# Patient Record
Sex: Male | Born: 1980
Health system: Southern US, Community
[De-identification: ages and names within clinical notes are randomized; demographics above are authoritative.]

## PROBLEM LIST (undated history)

## (undated) DIAGNOSIS — E119 Type 2 diabetes mellitus without complications: Secondary | ICD-10-CM

## (undated) DIAGNOSIS — I10 Essential (primary) hypertension: Secondary | ICD-10-CM

## (undated) HISTORY — DX: Essential (primary) hypertension: I10

## (undated) HISTORY — DX: Type 2 diabetes mellitus without complications: E11.9

---

## 2011-12-22 ENCOUNTER — Ambulatory Visit: Payer: Self-pay

## 2011-12-22 ENCOUNTER — Encounter: Payer: Self-pay | Admitting: Family Medicine

## 2011-12-22 ENCOUNTER — Ambulatory Visit (INDEPENDENT_AMBULATORY_CARE_PROVIDER_SITE_OTHER): Payer: Self-pay | Admitting: Family Medicine

## 2011-12-22 VITALS — BP 144/98 | HR 88 | Temp 98.2°F | Resp 16 | Ht 65.5 in | Wt 168.6 lb

## 2011-12-22 DIAGNOSIS — S6991XA Unspecified injury of right wrist, hand and finger(s), initial encounter: Secondary | ICD-10-CM

## 2011-12-22 DIAGNOSIS — S61409A Unspecified open wound of unspecified hand, initial encounter: Secondary | ICD-10-CM

## 2011-12-22 DIAGNOSIS — S61411A Laceration without foreign body of right hand, initial encounter: Secondary | ICD-10-CM

## 2011-12-22 DIAGNOSIS — S61459A Open bite of unspecified hand, initial encounter: Secondary | ICD-10-CM

## 2011-12-22 DIAGNOSIS — S6990XA Unspecified injury of unspecified wrist, hand and finger(s), initial encounter: Secondary | ICD-10-CM

## 2011-12-22 MED ORDER — AMOXICILLIN-POT CLAVULANATE 875-125 MG PO TABS: 1.0000 | ORAL_TABLET | Freq: Two times a day (BID) | ORAL | Status: DC

## 2011-12-22 MED ORDER — HYDROCODONE-ACETAMINOPHEN 5-500 MG PO TABS: ORAL_TABLET | ORAL | Status: DC

## 2011-12-22 MED ORDER — CEFTRIAXONE SODIUM 1 G IJ SOLR
1.0000 g | Freq: Once | INTRAMUSCULAR | Status: AC
  Administered 2011-12-22: 1 g via INTRAMUSCULAR

## 2011-12-22 NOTE — Progress Notes (Signed)
[address]   [place], [place]  [address]   [phone]  Subjective:    Patient ID: Billy Gregory, male    DOB: January 26, 1981, 31 y.o.   MRN: [id number]  HPI This 31 y.o. male presents for evaluation of dog bite to R hand.  Suffered dog bite to R hand this morning 1 hour before visit.  Was playing with 76 month old puppy rockweiler at home; fed puppy and dog bit patient on R hand.  Lots of bleeding from bite. Dog behaving normally; no unusual behavior.  Dog is 33 month old puppy who patient got from his friend; does not have vaccination record; pt's friend lost vaccination record but advised pt that vaccinations all UTD.    Last Tetanus this year four months ago.  Denies numbness/tinglilng/weakness in hand or fingers.  R hand dominant.  Friend present to translate. Dr. Marice Potter also collected history from patient and provided discharge instructions.    PMH: none Psurg:  None All: NKDA Medications: none Social: works in Stage manager; from [place].    Review of Systems  Constitutional: Negative for fever, chills, diaphoresis and fatigue.  Musculoskeletal: Positive for myalgias and arthralgias. Negative for joint swelling.  Skin: Positive for wound. Negative for color change, pallor and rash.       Objective:   Physical Exam  Nursing note and vitals reviewed. Constitutional: He is oriented to person, place, and time. He appears well-developed and well-nourished. No distress.  HENT:  Head: Normocephalic and atraumatic.  Eyes: Conjunctivae normal are normal. Pupils are equal, round, and reactive to light.  Cardiovascular: Intact distal pulses.        CAPILLARY REFILL < 3 SECONDS.  Musculoskeletal: Normal range of motion.       Right hand: He exhibits tenderness and laceration. He exhibits normal range of motion, no bony tenderness and no swelling. normal sensation noted. Normal strength noted.       R HAND:  FULL EXTENSION AND FLEXION 3RD DIGIT R HAND AT DIP, PIP JOINTS;  GRIP 5/5.  NORMAL FLEXION, EXTENSION 3RD DIGIT.    Neurological: He is alert and oriented to person, place, and time.       SENSATION INTACT DISTAL 3RD DIGIT.  Skin: He is not diaphoretic.       8 MM X 5 MM PROXIMAL 3RD DIGIT VOLAR SURFACE WITHOUT TENDON INVOLVEMENT.   X LACERATION DORSAL ASPECT OF DISTAL 3RD DIGIT WITH VOLAR PLATE INVOLVEMENT/LACERATION/AVULSION.  Psychiatric: He has a normal mood and affect. His behavior is normal. Judgment and thought content normal.     PROCEDURE NOTE: SEE PROCEDURE NOTE ATTACHED TO NOTE. ROCEPHIN 1 GRAM IM ADMINISTERED DURING VISIT. [facility] reading (PRIMARY) by  Dr. Katrinka Blazing.  R hand xray: negative.    Assessment & Plan:   1. Injury of right hand  DG Hand Complete Right, cefTRIAXone (ROCEPHIN) injection 1 g  2. Laceration of right hand, complicated       1.  Pain R hand: New.  Secondary to dog bite with laceration.  Rx for Vicodin 5/500 provided.   2.  Laceration R hand complicated with volar plate involvement. Tetatus UTD per patient in 2013.  Refer for hand surgery tomorrow/[facility].  Wound loosely closed until evaluation by hand surgery in 48 hours. 3.  Dog bite from personal dog:  New.  S/p Rocephin 1 gram IM; rx Augmentin 875mg  bid x 10 days.  Report generated during visit and faxed to animal control of [facility]  Dog  behaving normally per patient; to attempt to obtain dog immunization records from vet.

## 2011-12-22 NOTE — Patient Instructions (Addendum)
1. Injury of right hand  DG Hand Complete Right, cefTRIAXone (ROCEPHIN) injection 1 g, HYDROcodone-acetaminophen (VICODIN) 5-500 MG per tablet  2. Laceration of right hand, complicated    3. Dog bite of hand  amoxicillin-clavulanate (AUGMENTIN) 875-125 MG per tablet     PLEASE CALL DR. Ronie Spies OFFICE (HAND SURGERY) TOMORROW FOR APPOINTMENT ON Tuesday, December 24, 2011.   DR. Ronie Spies OFFICE NUMBER    [phone] [address]    [place], [place]

## 2011-12-22 NOTE — Progress Notes (Signed)
   Patient ID: Billy Gregory MRN: [id number], DOB: Apr 21, 1980, 31 y.o. Date of Encounter: 12/22/2011, 2:27 PM   PROCEDURE NOTE: Verbal consent obtained. Sterile technique employed. Numbing: Anesthesia obtained with 2% plain lidocaine 3 cc for local anesthesia on volar surface and 2 cc for dorsal surface.   Cleansed with soap and water. Irrigated with normal saline. Wounds explored. No deep structures involved in volar wound. Lateral aspect of tendon sheath avulsed along dorsal wound. Wounds washed again with soap and water and irrigated. Betadine prep per usual protocol.  Volar wound loosely approximated with #4 simple interrupted sutures 5-0 Prolene. Dorsal wound loosely approximated with #1 simple interrupted suture 5-0 Prolene. Hemostasis obtained. Wound cleansed and dressed.  Wound care instructions including precautions covered with patient. Handout given.  Bite report filled out and faxed to animal control.   Signed, Eula Listen, PA-C 12/22/2011 2:27 PM

## 2012-01-22 NOTE — Progress Notes (Signed)
Reviewed and agree.

## 2013-10-06 IMAGING — CR DG HAND COMPLETE 3+V*R*
3 series · 3 of 3 positions shown · non-contrast
Comparison: None.

CLINICAL DATA: Dog bite.  Pain.

RIGHT HAND - COMPLETE 3+ VIEW

[PA]
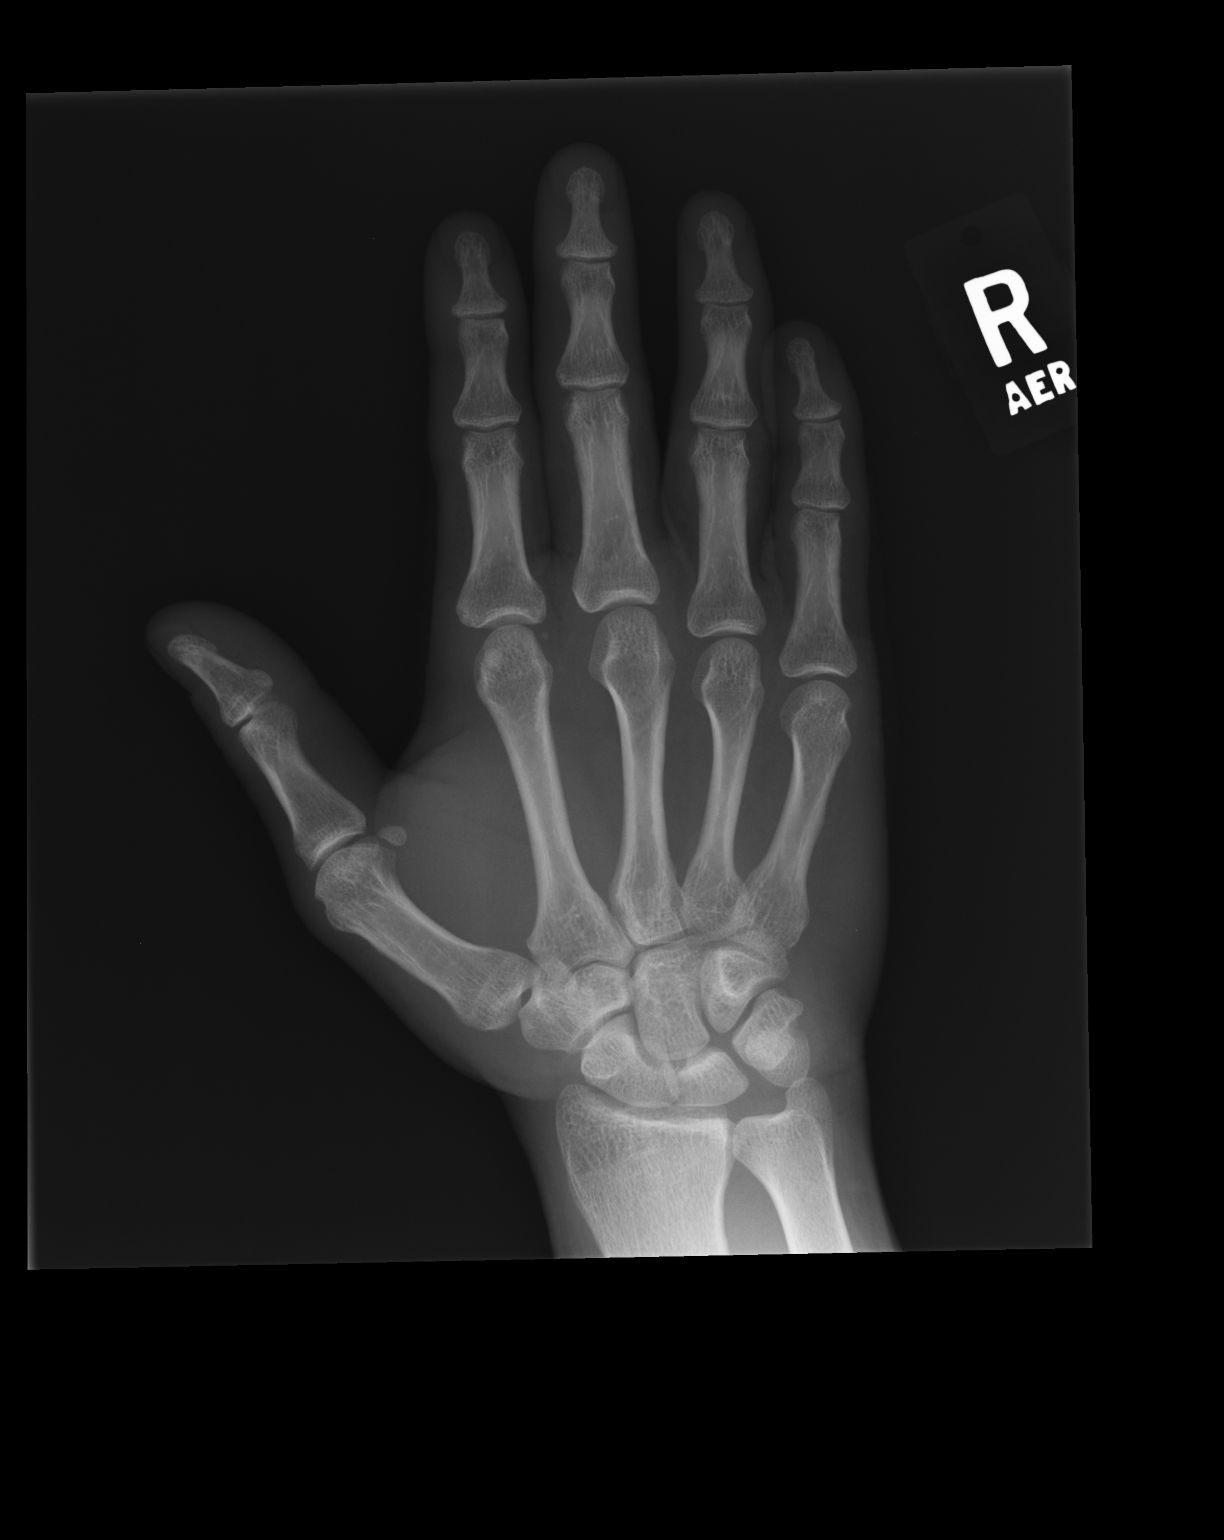

[lateral]
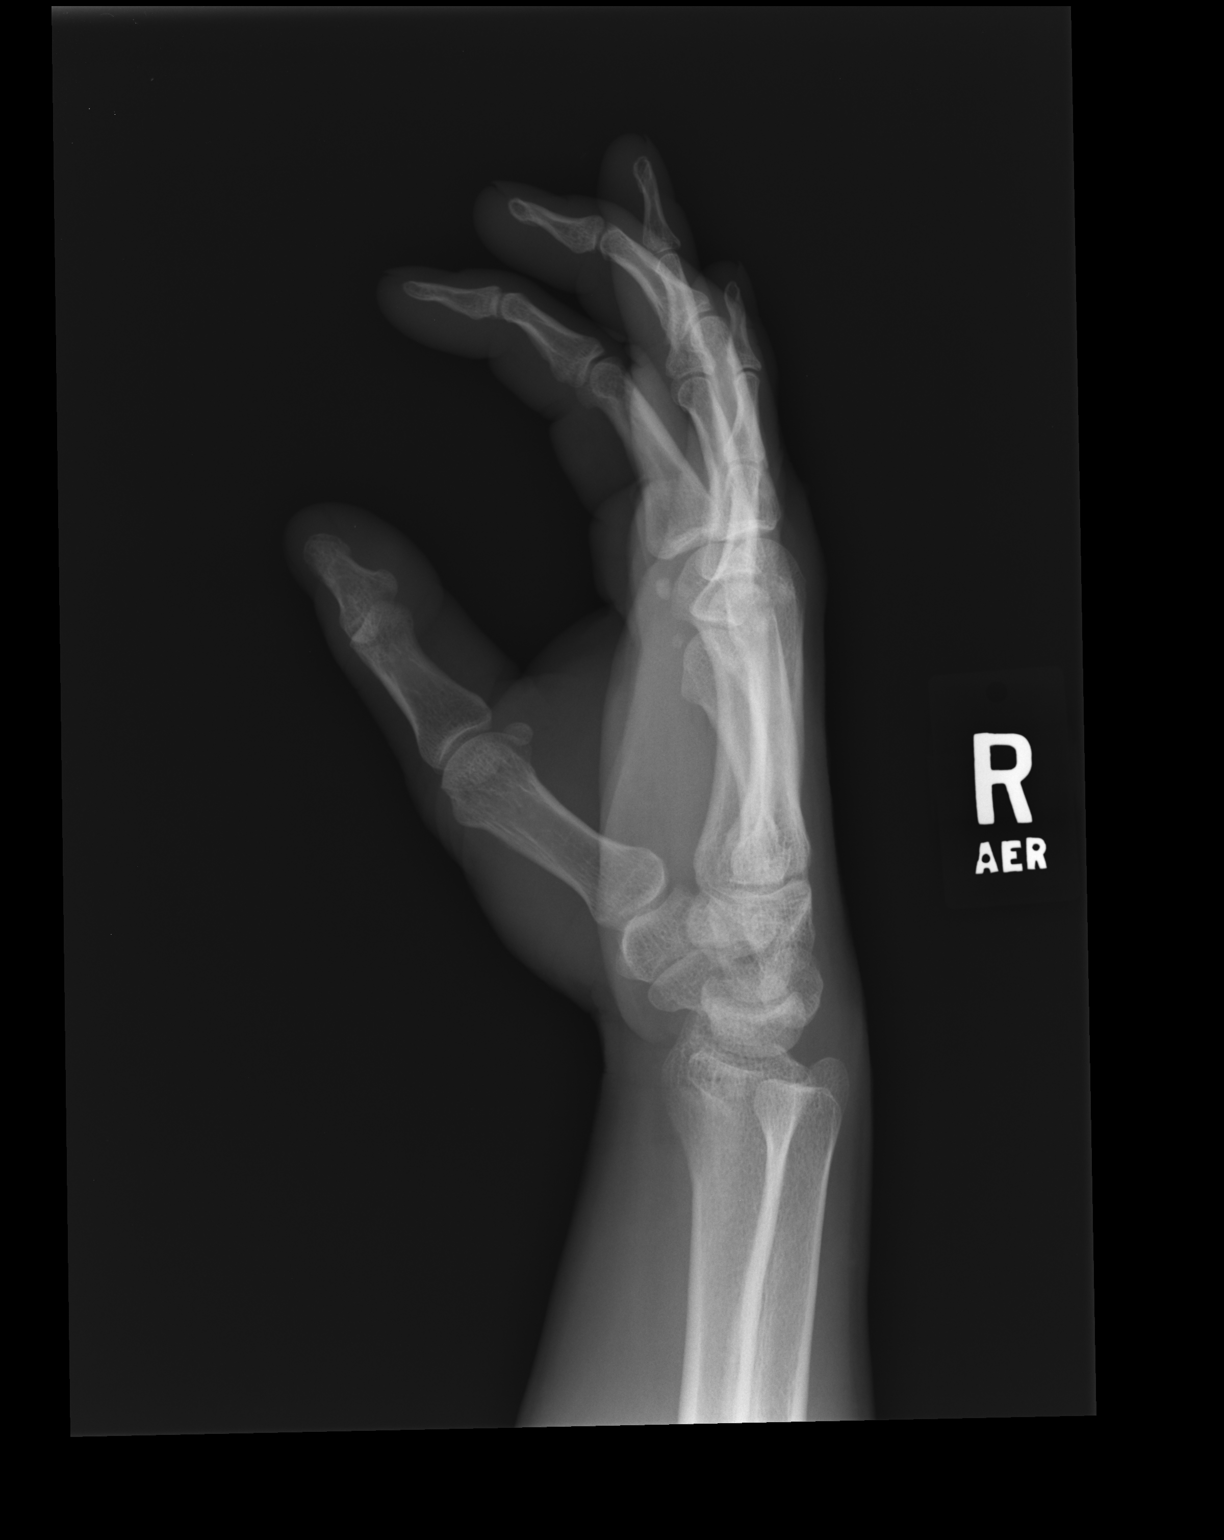

[pa obl]
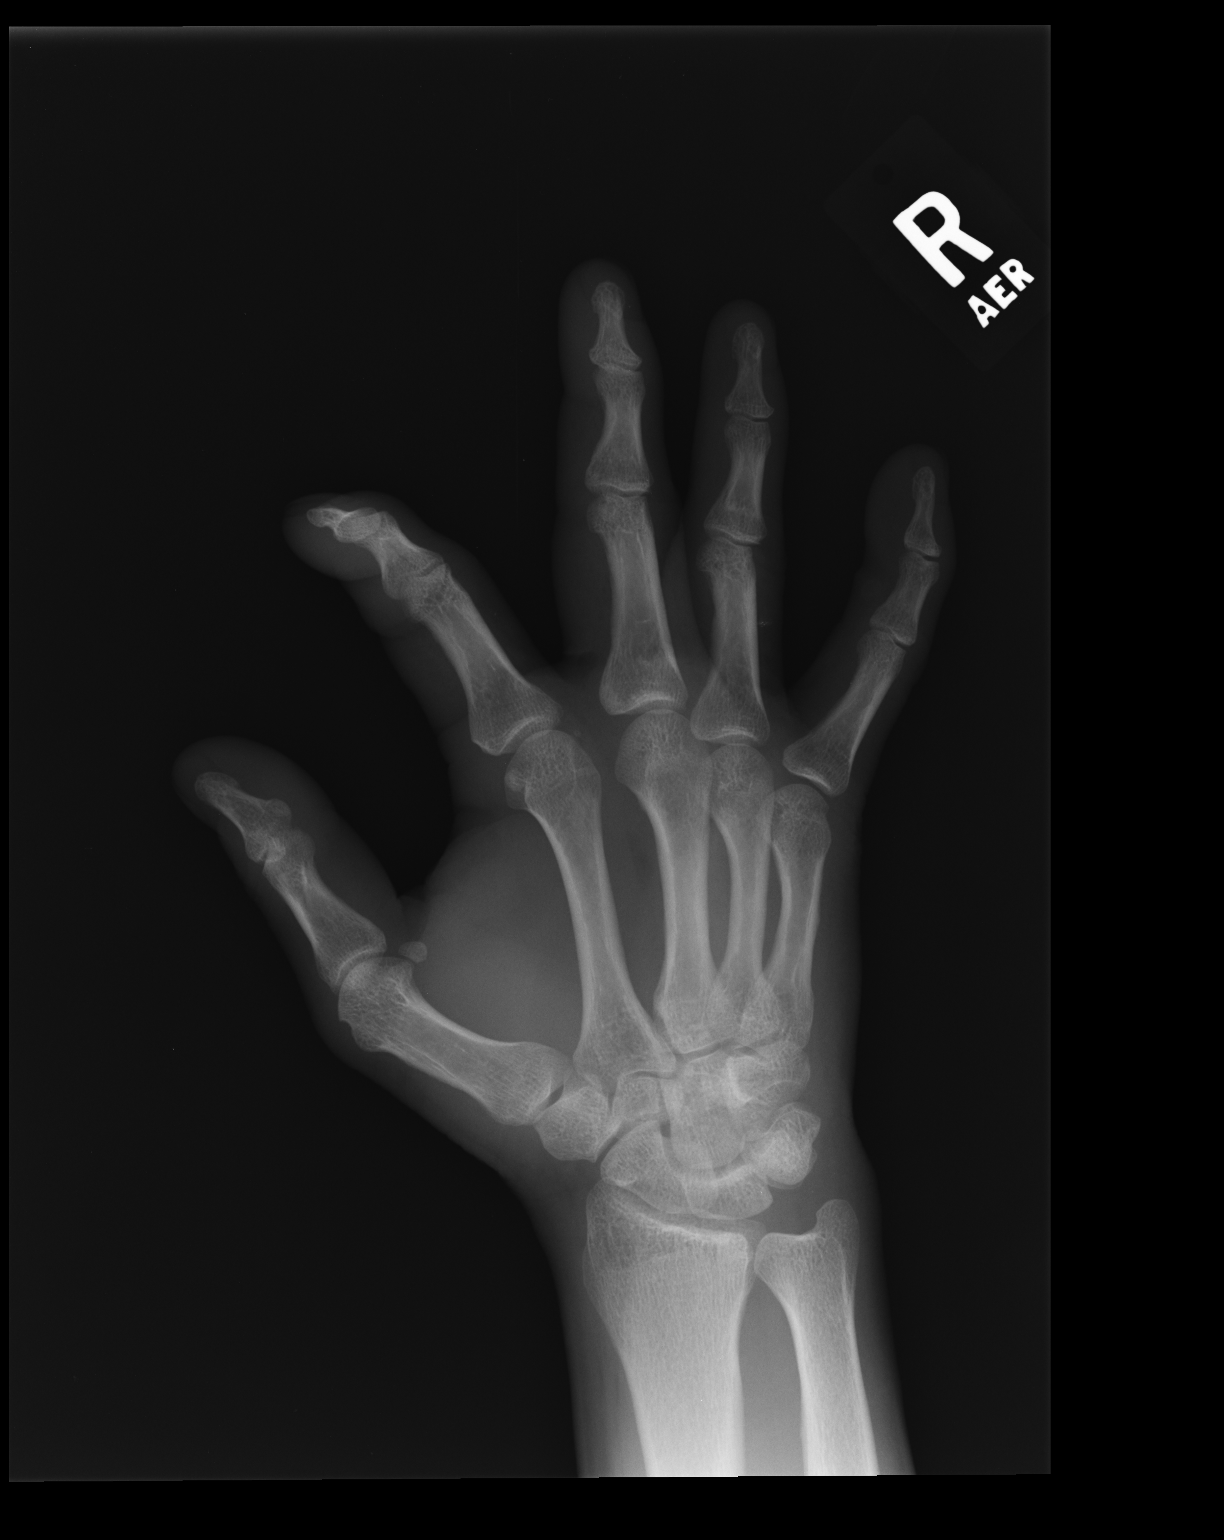

[3 of 3 positions shown; findings below may reference images not displayed]

FINDINGS: Imaged bones, joints and soft tissues appear normal.
IMPRESSION: Negative exam.

## 2014-04-03 ENCOUNTER — Encounter (HOSPITAL_COMMUNITY): Payer: Self-pay | Admitting: Emergency Medicine

## 2014-04-03 ENCOUNTER — Inpatient Hospital Stay (HOSPITAL_COMMUNITY)
Admission: EM | Admit: 2014-04-03 | Discharge: 2014-04-05 | DRG: 639 | Disposition: A | Discharge status: Home / Self Care | Payer: Medicaid Other | Attending: Family Medicine | Admitting: Family Medicine

## 2014-04-03 ENCOUNTER — Emergency Department (INDEPENDENT_AMBULATORY_CARE_PROVIDER_SITE_OTHER)
Admission: EM | Admit: 2014-04-03 | Discharge: 2014-04-03 | Disposition: A | Discharge status: Nursing Home (Includes ICF) | Payer: Medicaid Other | Source: Home / Self Care

## 2014-04-03 ENCOUNTER — Encounter (HOSPITAL_COMMUNITY): Payer: Self-pay | Admitting: *Deleted

## 2014-04-03 DIAGNOSIS — E1165 Type 2 diabetes mellitus with hyperglycemia: Secondary | ICD-10-CM | POA: Diagnosis not present

## 2014-04-03 DIAGNOSIS — R739 Hyperglycemia, unspecified: Secondary | ICD-10-CM | POA: Diagnosis not present

## 2014-04-03 DIAGNOSIS — H538 Other visual disturbances: Secondary | ICD-10-CM | POA: Diagnosis present

## 2014-04-03 DIAGNOSIS — F17210 Nicotine dependence, cigarettes, uncomplicated: Secondary | ICD-10-CM | POA: Diagnosis present

## 2014-04-03 DIAGNOSIS — E119 Type 2 diabetes mellitus without complications: Secondary | ICD-10-CM | POA: Insufficient documentation

## 2014-04-03 DIAGNOSIS — E860 Dehydration: Secondary | ICD-10-CM

## 2014-04-03 LAB — POCT I-STAT, CHEM 8
BUN: 22 mg/dL (ref 6–23)
CHLORIDE: 96 meq/L (ref 96–112)
CREATININE: 1 mg/dL (ref 0.50–1.35)
Calcium, Ion: 1.28 mmol/L — ABNORMAL HIGH (ref 1.12–1.23)
Glucose, Bld: >700 mg/dL (ref 70–99)
HEMATOCRIT: 50 % (ref 39.0–52.0)
Hemoglobin: 17 g/dL (ref 13.0–17.0)
POTASSIUM: 4.8 mmol/L (ref 3.5–5.1)
Sodium: 134 mmol/L — ABNORMAL LOW (ref 135–145)
TCO2: 24 mmol/L (ref 0–100)

## 2014-04-03 LAB — POCT URINALYSIS DIP (DEVICE)
BILIRUBIN URINE: NEGATIVE
HGB URINE DIPSTICK: NEGATIVE
KETONES UR: 15 mg/dL — AB
LEUKOCYTES UA: NEGATIVE
Nitrite: NEGATIVE
PH: 5 (ref 5.0–8.0)
Protein, ur: NEGATIVE mg/dL
Specific Gravity, Urine: <=1.005 (ref 1.005–1.030)
UROBILINOGEN UA: 0.2 mg/dL (ref 0.0–1.0)

## 2014-04-03 LAB — COMPREHENSIVE METABOLIC PANEL
ALT: 59 U/L — ABNORMAL HIGH (ref 0–53)
AST: 30 U/L (ref 0–37)
Albumin: 3.8 g/dL (ref 3.5–5.2)
Alkaline Phosphatase: 91 U/L (ref 39–117)
Anion gap: 7 (ref 5–15)
BUN: 17 mg/dL (ref 6–23)
CO2: 25 mmol/L (ref 19–32)
Calcium: 9.4 mg/dL (ref 8.4–10.5)
Chloride: 106 mEq/L (ref 96–112)
Creatinine, Ser: 1 mg/dL (ref 0.50–1.35)
GFR calc non Af Amer: >90 mL/min (ref >90)
GLUCOSE: 546 mg/dL — AB (ref 70–99)
Potassium: 4.4 mmol/L (ref 3.5–5.1)
SODIUM: 138 mmol/L (ref 135–145)
Total Bilirubin: 2.1 mg/dL — ABNORMAL HIGH (ref 0.3–1.2)
Total Protein: 6.7 g/dL (ref 6.0–8.3)

## 2014-04-03 LAB — BASIC METABOLIC PANEL
Anion gap: 10 (ref 5–15)
BUN: 11 mg/dL (ref 6–23)
CO2: 21 mmol/L (ref 19–32)
CREATININE: 0.73 mg/dL (ref 0.50–1.35)
Calcium: 8.6 mg/dL (ref 8.4–10.5)
Chloride: 112 mEq/L (ref 96–112)
GFR calc non Af Amer: >90 mL/min (ref >90)
Glucose, Bld: 301 mg/dL — ABNORMAL HIGH (ref 70–99)
Potassium: 3.7 mmol/L (ref 3.5–5.1)
Sodium: 143 mmol/L (ref 135–145)

## 2014-04-03 LAB — URINALYSIS, ROUTINE W REFLEX MICROSCOPIC
BILIRUBIN URINE: NEGATIVE
Glucose, UA: >1000 mg/dL — AB
Ketones, ur: 40 mg/dL — AB
LEUKOCYTES UA: NEGATIVE
Nitrite: NEGATIVE
PROTEIN: NEGATIVE mg/dL
Specific Gravity, Urine: 1.038 — ABNORMAL HIGH (ref 1.005–1.030)
Urobilinogen, UA: 0.2 mg/dL (ref 0.0–1.0)
pH: 5 (ref 5.0–8.0)

## 2014-04-03 LAB — URINE MICROSCOPIC-ADD ON

## 2014-04-03 LAB — CBC
HCT: 39.4 % (ref 39.0–52.0)
Hemoglobin: 14.2 g/dL (ref 13.0–17.0)
MCH: 28 pg (ref 26.0–34.0)
MCHC: 36 g/dL (ref 30.0–36.0)
MCV: 77.6 fL — ABNORMAL LOW (ref 78.0–100.0)
PLATELETS: 181 10*3/uL (ref 150–400)
RBC: 5.08 MIL/uL (ref 4.22–5.81)
RDW: 12.4 % (ref 11.5–15.5)
WBC: 8.8 10*3/uL (ref 4.0–10.5)

## 2014-04-03 LAB — GLUCOSE, CAPILLARY: Glucose-Capillary: 453 mg/dL — ABNORMAL HIGH (ref 70–99)

## 2014-04-03 LAB — CBG MONITORING, ED: Glucose-Capillary: 337 mg/dL — ABNORMAL HIGH (ref 70–99)

## 2014-04-03 MED ORDER — SODIUM CHLORIDE 0.9 % IV BOLUS (SEPSIS)
1000.0000 mL | Freq: Once | INTRAVENOUS | Status: AC
  Administered 2014-04-03: 1000 mL via INTRAVENOUS

## 2014-04-03 MED ORDER — SODIUM CHLORIDE 0.9 % IV SOLN
INTRAVENOUS | Status: DC
  Administered 2014-04-03: 17:00:00 via INTRAVENOUS

## 2014-04-03 MED ORDER — SODIUM CHLORIDE 0.9 % IV SOLN
Freq: Once | INTRAVENOUS | Status: AC
  Administered 2014-04-03: 12:00:00 via INTRAVENOUS

## 2014-04-03 MED ORDER — INFLUENZA VAC SPLIT QUAD 0.5 ML IM SUSY: 0.5000 mL | PREFILLED_SYRINGE | INTRAMUSCULAR | Status: DC | PRN

## 2014-04-03 MED ORDER — INSULIN ASPART 100 UNIT/ML ~~LOC~~ SOLN
10.0000 [IU] | Freq: Once | SUBCUTANEOUS | Status: AC
  Administered 2014-04-03: 10 [IU] via SUBCUTANEOUS

## 2014-04-03 MED ORDER — ACETAMINOPHEN 325 MG PO TABS
650.0000 mg | ORAL_TABLET | Freq: Four times a day (QID) | ORAL | Status: DC | PRN
Start: 2014-04-03 — End: 2014-04-05

## 2014-04-03 MED ORDER — INSULIN ASPART 100 UNIT/ML ~~LOC~~ SOLN
0.0000 [IU] | Freq: Three times a day (TID) | SUBCUTANEOUS | Status: DC
  Administered 2014-04-04 (×2): 8 [IU] via SUBCUTANEOUS
  Administered 2014-04-04: 5 [IU] via SUBCUTANEOUS
  Administered 2014-04-05: 15 [IU] via SUBCUTANEOUS
  Administered 2014-04-05: 5 [IU] via SUBCUTANEOUS

## 2014-04-03 MED ORDER — SODIUM CHLORIDE 0.9 % IV SOLN
INTRAVENOUS | Status: DC
  Administered 2014-04-03: 23:00:00 via INTRAVENOUS
  Administered 2014-04-03: 150 mL/h via INTRAVENOUS
  Administered 2014-04-04: 05:00:00 via INTRAVENOUS

## 2014-04-03 MED ORDER — INSULIN ASPART 100 UNIT/ML ~~LOC~~ SOLN
0.0000 [IU] | Freq: Every day | SUBCUTANEOUS | Status: DC
  Administered 2014-04-04: 2 [IU] via SUBCUTANEOUS

## 2014-04-03 MED ORDER — INSULIN ASPART 100 UNIT/ML ~~LOC~~ SOLN
14.0000 [IU] | Freq: Once | SUBCUTANEOUS | Status: AC
  Administered 2014-04-03: 14 [IU] via SUBCUTANEOUS
  Filled 2014-04-03: qty 1

## 2014-04-03 MED ORDER — ACETAMINOPHEN 650 MG RE SUPP: 650.0000 mg | Freq: Four times a day (QID) | RECTAL | Status: DC | PRN

## 2014-04-03 MED ORDER — INSULIN ASPART 100 UNIT/ML ~~LOC~~ SOLN: 0.0000 [IU] | Freq: Three times a day (TID) | SUBCUTANEOUS | Status: DC

## 2014-04-03 MED ORDER — INSULIN ASPART 100 UNIT/ML ~~LOC~~ SOLN: 0.0000 [IU] | Freq: Every day | SUBCUTANEOUS | Status: DC

## 2014-04-03 MED ORDER — SODIUM CHLORIDE 0.9 % IV SOLN: Freq: Once | INTRAVENOUS | Status: DC

## 2014-04-03 MED ORDER — ENOXAPARIN SODIUM 40 MG/0.4ML ~~LOC~~ SOLN
40.0000 mg | SUBCUTANEOUS | Status: DC
  Administered 2014-04-03 – 2014-04-04 (×2): 40 mg via SUBCUTANEOUS
  Filled 2014-04-03 (×3): qty 0.4

## 2014-04-03 MED ORDER — PNEUMOCOCCAL VAC POLYVALENT 25 MCG/0.5ML IJ INJ: 0.5000 mL | INJECTION | INTRAMUSCULAR | Status: DC | PRN

## 2014-04-03 MED ORDER — INSULIN GLARGINE 100 UNIT/ML ~~LOC~~ SOLN
10.0000 [IU] | Freq: Every day | SUBCUTANEOUS | Status: DC
  Administered 2014-04-04 – 2014-04-05 (×2): 10 [IU] via SUBCUTANEOUS
  Filled 2014-04-03 (×2): qty 0.1

## 2014-04-03 NOTE — ED Notes (Signed)
[facility] notified of pt transport.  Report called to ED Charge RN, Lenna Sciara.

## 2014-04-03 NOTE — ED Notes (Signed)
From [facility]; went to [facility] for thirst x 46month, blurry vision and shakiness x days. At [facility] CBG read greater than 700. Given 1L NS and sent to ED. Denies pain.

## 2014-04-03 NOTE — ED Notes (Signed)
Per wife: c/o thirst x 1 month.  Over past 2 days has had episodes of "shaking", c/o dizziness & blurred vision.  Denies hx diabetes.

## 2014-04-03 NOTE — H&P (Signed)
[facility] Admission History and Physical Service Pager: [phone]  Patient name: Billy Gregory Medical record number: [id number] Date of birth: 08/15/1980 Age: 34 y.o. Gender: male  Primary Care Provider: No PCP Per Patient Consultants: None  Code Status: Full  Chief Complaint: Polyuria and polydipsia  Assessment and Plan: Billy Gregory is a 34 y.o. male presenting with hyperglycemia in the setting of polyuria, polydipsia, and weight loss. PMH none.   New-onset diabetes: Patient initially presented with CBG >700. Lab work unremarkable for ketones in urine, AG normal, and bicarbonate normal. S/p 3 NS boluses. He was given 14 units of NovoLog. Unsure whether this is type I or type 2 diabetes. Patient has no family history of diabetes. Patient is also not obese. Patient status post 14 units of NovoLog - Admit to floor under Dr. Erin Hearing -will need diabetes education, consult to DM coordinator -Labs: A1c, C-peptide, BMP, insulin, islet cell antibodies, lipids and TSH in am -Sensitive insulin sliding scale started -Lantus 10 units and will titrate up as necessary -patient may be able to tolerate oral therapy but will decide once labs resulted -CBGs 4 times daily before meals and at bedtime  #Social: Patient currently without insurance and no PCP.  -Will place case management consult  FEN/GI: Carb modified diet; NS @150mL /hr Prophylaxis: Lovenox  Disposition: Admit to med-surg floor for inpatient status.  History of Present Illness: Billy Gregory is a 34 y.o. male presenting with hyperglycemia with associated weight loss, polyuria, and polydipsia. He has no significant past medical history. Patient states that about a week ago he started feeling very thirsty. He has been drinking a lot of water and juice and peeing large volumes frequently. He has not been eating as much as usual and his pants have become baggy so he thinks he is  losing weight but has not weighed himself recently to know for sure.  He has noticed cramping in his arms and legs on and off over the last week. She endorses blurry vision dizziness and weakness. There is no family history of diabetes.  Review Of Systems: Per HPI. Otherwise 12 point review of systems was performed and was unremarkable.  There are no active problems to display for this patient.  Past Medical History: History reviewed. No pertinent past medical history. Past Surgical History: History reviewed. No pertinent past surgical history. Social History: History  Substance Use Topics  . Smoking status: Current Some Day Smoker  . Smokeless tobacco: Not on file  . Alcohol Use: Yes     Comment: occasional   Please also refer to relevant sections of EMR.  Family History: History reviewed. No pertinent family history. Allergies and Medications: No Known Allergies No current facility-administered medications on file prior to encounter.   Current Outpatient Prescriptions on File Prior to Encounter  Medication Sig Dispense Refill  . amoxicillin-clavulanate (AUGMENTIN) 875-125 MG per tablet Take 1 tablet by mouth 2 (two) times daily. (Patient not taking: Reported on 04/03/2014) 20 tablet 0  . HYDROcodone-acetaminophen (VICODIN) 5-500 MG per tablet 1-2 tablets every six hours PRN pain (Patient not taking: Reported on 04/03/2014) 30 tablet 0    Objective: BP 132/83 mmHg  Pulse 87  Temp(Src) 98.6 F (37 C) (Oral)  Resp 23  SpO2 100% Exam: General: alert, well-developed, NAD, pleasant and cooperative HEENT: vision grossly intact, no injection and anicteric. Extraocular motion intact. MMM. Neck supple. Lungs: CTAB, normal respiratory effort, no crackles, and no wheezes. Heart: RRR, no M/R/G.  Abdomen: Bowel sounds normal;  abdomen soft and nontender. No masses, organomegaly or hernias noted. no guarding, no rebound tenderness Extremities: No cyanosis, clubbing, edema Neurologic:  No focal deficits, A&Ox3.  Skin: Intact without suspicious lesions or rashes. Warm and dry.   Labs and Imaging: Results for orders placed or performed during the hospital encounter of 04/03/14 (from the past 24 hour(s))  Urinalysis, Routine w reflex microscopic     Status: Abnormal   Collection Time: 04/03/14  1:44 PM  Result Value Ref Range   Color, Urine YELLOW YELLOW   APPearance CLEAR CLEAR   Specific Gravity, Urine 1.038 (H) 1.005 - 1.030   pH 5.0 5.0 - 8.0   Glucose, UA >1000 (A) NEGATIVE mg/dL   Hgb urine dipstick TRACE (A) NEGATIVE   Bilirubin Urine NEGATIVE NEGATIVE   Ketones, ur 40 (A) NEGATIVE mg/dL   Protein, ur NEGATIVE NEGATIVE mg/dL   Urobilinogen, UA 0.2 0.0 - 1.0 mg/dL   Nitrite NEGATIVE NEGATIVE   Leukocytes, UA NEGATIVE NEGATIVE  Urine microscopic-add on     Status: None   Collection Time: 04/03/14  1:44 PM  Result Value Ref Range   RBC / HPF 0-2 <3 RBC/hpf  CBC     Status: Abnormal   Collection Time: 04/03/14  2:02 PM  Result Value Ref Range   WBC 8.8 4.0 - 10.5 K/uL   RBC 5.08 4.22 - 5.81 MIL/uL   Hemoglobin 14.2 13.0 - 17.0 g/dL   HCT 39.4 39.0 - 52.0 %   MCV 77.6 (L) 78.0 - 100.0 fL   MCH 28.0 26.0 - 34.0 pg   MCHC 36.0 30.0 - 36.0 g/dL   RDW 12.4 11.5 - 15.5 %   Platelets 181 150 - 400 K/uL  Comprehensive metabolic panel     Status: Abnormal   Collection Time: 04/03/14  2:02 PM  Result Value Ref Range   Sodium 138 135 - 145 mmol/L   Potassium 4.4 3.5 - 5.1 mmol/L   Chloride 106 96 - 112 mEq/L   CO2 25 19 - 32 mmol/L   Glucose, Bld 546 (H) 70 - 99 mg/dL   BUN 17 6 - 23 mg/dL   Creatinine, Ser 1.00 0.50 - 1.35 mg/dL   Calcium 9.4 8.4 - 10.5 mg/dL   Total Protein 6.7 6.0 - 8.3 g/dL   Albumin 3.8 3.5 - 5.2 g/dL   AST 30 0 - 37 U/L   ALT 59 (H) 0 - 53 U/L   Alkaline Phosphatase 91 39 - 117 U/L   Total Bilirubin 2.1 (H) 0.3 - 1.2 mg/dL   GFR calc non Af Amer >90 >90 mL/min   GFR calc Af Amer >90 >90 mL/min   Anion gap 7 5 - 15  POC CBG, ED      Status: Abnormal   Collection Time: 04/03/14  4:06 PM  Result Value Ref Range   Glucose-Capillary 337 (H) 70 - 99 mg/dL    Katheren Shams, DO 04/03/2014, 4:30 PM PGY-1, [place] Intern pager: [phone], text pages welcome  Beverlyn Roux, MD, MPH [facility] PGY-2 04/03/2014 8:33 PM

## 2014-04-03 NOTE — Progress Notes (Addendum)
Pt's cbg 453. Paged provider on call. Awaiting further orders. Pt to get 10 units of novolog. Pt given 10 units of novolog. Will have tech to recheck in 1 hour.

## 2014-04-03 NOTE — ED Provider Notes (Signed)
CSN: [id number]     Arrival date & time 04/03/14  1253 History   First MD Initiated Contact with Patient 04/03/14 1306     Chief Complaint  Patient presents with  . Hyperglycemia     (Consider location/radiation/quality/duration/timing/severity/associated sxs/prior Treatment) Patient is a 34 y.o. male presenting with hyperglycemia. The history is provided by the patient.  Hyperglycemia Associated symptoms: increased thirst and polyuria   Associated symptoms: no abdominal pain, no chest pain, no confusion, no dysuria, no fever, no shortness of breath and no vomiting   pt c/o progressive polyuria, polydipsia, unspec wt loss, gen weakness for the past month. symptoms mod-severe. No prior hx diabetes. Pt denies fever or chills. No gu c/o. Pt denies abd pain. No nvd. Pt was seen at urgent care and blood sugar >700. Was started on ivf and sent to ED.        History reviewed. No pertinent past medical history. History reviewed. No pertinent past surgical history. History reviewed. No pertinent family history. History  Substance Use Topics  . Smoking status: Current Some Day Smoker  . Smokeless tobacco: Not on file  . Alcohol Use: Yes     Comment: occasional    Review of Systems  Constitutional: Negative for fever.  HENT: Negative for sore throat.   Eyes: Negative for redness.  Respiratory: Negative for shortness of breath.   Cardiovascular: Negative for chest pain.  Gastrointestinal: Negative for vomiting, abdominal pain and diarrhea.  Endocrine: Positive for polydipsia and polyuria.  Genitourinary: Negative for dysuria and flank pain.  Musculoskeletal: Negative for back pain and neck pain.  Skin: Negative for rash.  Neurological: Negative for headaches.  Hematological: Does not bruise/bleed easily.  Psychiatric/Behavioral: Negative for confusion.      Allergies  Review of patient's allergies indicates no known allergies.  Home Medications   Prior to Admission  medications   Medication Sig Start Date End Date Taking? Authorizing Provider  acetaminophen (TYLENOL) 325 MG tablet Take 650 mg by mouth every 6 (six) hours as needed.   Yes Historical Provider, MD  amoxicillin-clavulanate (AUGMENTIN) 875-125 MG per tablet Take 1 tablet by mouth 2 (two) times daily. Patient not taking: Reported on 04/03/2014 12/22/11   Wardell Honour, MD  HYDROcodone-acetaminophen (VICODIN) 5-500 MG per tablet 1-2 tablets every six hours PRN pain Patient not taking: Reported on 04/03/2014 12/22/11   Wardell Honour, MD   BP 132/83 mmHg  Pulse 87  Temp(Src) 98.6 F (37 C) (Oral)  Resp 23  SpO2 100% Physical Exam  Constitutional: He is oriented to person, place, and time. He appears well-developed and well-nourished. No distress.  HENT:  Dry lips and mm.   Eyes: Conjunctivae are normal. No scleral icterus.  Neck: Neck supple. No tracheal deviation present.  Cardiovascular: Normal rate, regular rhythm, normal heart sounds and intact distal pulses.   Pulmonary/Chest: Effort normal and breath sounds normal. No accessory muscle usage. No respiratory distress.  Abdominal: Soft. Bowel sounds are normal. He exhibits no distension and no mass. There is no tenderness. There is no rebound and no guarding.  Genitourinary:  No cva tenderness  Musculoskeletal: Normal range of motion. He exhibits no edema or tenderness.  Neurological: He is alert and oriented to person, place, and time.  Skin: Skin is warm and dry. No rash noted. He is not diaphoretic.  Psychiatric: He has a normal mood and affect.  Nursing note and vitals reviewed.   ED Course  Procedures (including critical care time) Labs Review  Results for orders placed or performed during the hospital encounter of 04/03/14  CBC  Result Value Ref Range   WBC 8.8 4.0 - 10.5 K/uL   RBC 5.08 4.22 - 5.81 MIL/uL   Hemoglobin 14.2 13.0 - 17.0 g/dL   HCT 39.4 39.0 - 52.0 %   MCV 77.6 (L) 78.0 - 100.0 fL   MCH 28.0 26.0 - 34.0  pg   MCHC 36.0 30.0 - 36.0 g/dL   RDW 12.4 11.5 - 15.5 %   Platelets 181 150 - 400 K/uL  Comprehensive metabolic panel  Result Value Ref Range   Sodium 138 135 - 145 mmol/L   Potassium 4.4 3.5 - 5.1 mmol/L   Chloride 106 96 - 112 mEq/L   CO2 25 19 - 32 mmol/L   Glucose, Bld 546 (H) 70 - 99 mg/dL   BUN 17 6 - 23 mg/dL   Creatinine, Ser 1.00 0.50 - 1.35 mg/dL   Calcium 9.4 8.4 - 10.5 mg/dL   Total Protein 6.7 6.0 - 8.3 g/dL   Albumin 3.8 3.5 - 5.2 g/dL   AST 30 0 - 37 U/L   ALT 59 (H) 0 - 53 U/L   Alkaline Phosphatase 91 39 - 117 U/L   Total Bilirubin 2.1 (H) 0.3 - 1.2 mg/dL   GFR calc non Af Amer >90 >90 mL/min   GFR calc Af Amer >90 >90 mL/min   Anion gap 7 5 - 15  Urinalysis, Routine w reflex microscopic  Result Value Ref Range   Color, Urine YELLOW YELLOW   APPearance CLEAR CLEAR   Specific Gravity, Urine 1.038 (H) 1.005 - 1.030   pH 5.0 5.0 - 8.0   Glucose, UA >1000 (A) NEGATIVE mg/dL   Hgb urine dipstick TRACE (A) NEGATIVE   Bilirubin Urine NEGATIVE NEGATIVE   Ketones, ur 40 (A) NEGATIVE mg/dL   Protein, ur NEGATIVE NEGATIVE mg/dL   Urobilinogen, UA 0.2 0.0 - 1.0 mg/dL   Nitrite NEGATIVE NEGATIVE   Leukocytes, UA NEGATIVE NEGATIVE  Urine microscopic-add on  Result Value Ref Range   RBC / HPF 0-2 <3 RBC/hpf  POC CBG, ED  Result Value Ref Range   Glucose-Capillary 337 (H) 70 - 99 mg/dL     MDM   Iv ns bolus.  Labs.  Initial blood sugar at urgent care > 700.  hco3 normal.  Additional iv ns bolus.   Novolog.   Additional fluids.  Given language barrier, no pcp, new onset diabetes, and initial bs > 700, will admit to med service.  Discussed w roc who asks for temp orders to Dr Erin Hearing.       Mirna Mires, MD 04/03/14 [phone]

## 2014-04-03 NOTE — ED Provider Notes (Signed)
CSN: [id number]     Arrival date & time 04/03/14  1117 History   None    Chief Complaint  Patient presents with  . Dizziness  . Blurred Vision   (Consider location/radiation/quality/duration/timing/severity/associated sxs/prior Treatment)  HPI   The patient is a 34 year old male presenting today complaints of shaking and blurred vision  2 days. The patient reports excessive thirst for the past week, but increasing thirst for the past month. Patient denies any personal or familial history of diabetes. States his only medical concern was he noticed bright red blood in his stools which resolved on its own approximately 3 months ago. He has no primary care provider.  Patient speaks little [place].  Wife at beside.  Patient is a Paediatric nurse.   History reviewed. No pertinent past medical history. History reviewed. No pertinent past surgical history. No family history on file. History  Substance Use Topics  . Smoking status: Current Some Day Smoker  . Smokeless tobacco: Not on file  . Alcohol Use: Yes     Comment: occasional    Review of Systems  Constitutional: Positive for chills and fatigue.       Increasing thirst and weakness. Wife states complaining of the cold more than normal.  HENT: Negative.   Eyes: Positive for visual disturbance.  Respiratory: Negative.  Negative for chest tightness and shortness of breath.   Cardiovascular: Negative.  Negative for chest pain, palpitations and leg swelling.  Gastrointestinal: Negative.  Negative for nausea, vomiting and diarrhea.  Endocrine: Negative.   Genitourinary: Positive for frequency. Negative for hematuria.  Musculoskeletal: Negative.   Skin: Negative.   Allergic/Immunologic: Negative.   Neurological: Positive for dizziness, weakness and light-headedness. Negative for tremors, seizures, facial asymmetry and numbness.  Hematological: Negative.   Psychiatric/Behavioral: Negative.     Allergies  Review of patient's allergies  indicates no known allergies.  Home Medications   Prior to Admission medications   Medication Sig Start Date End Date Taking? Authorizing Provider  amoxicillin-clavulanate (AUGMENTIN) 875-125 MG per tablet Take 1 tablet by mouth 2 (two) times daily. 12/22/11   Wardell Honour, MD  HYDROcodone-acetaminophen (VICODIN) 5-500 MG per tablet 1-2 tablets every six hours PRN pain 12/22/11   Wardell Honour, MD   BP 150/91 mmHg  Pulse 112  Temp(Src) 98 F (36.7 C) (Oral)  Resp 18  SpO2 97%    Physical Exam  Constitutional: He is oriented to person, place, and time. He appears well-developed and well-nourished. No distress.  Cardiovascular: Normal rate, regular rhythm, normal heart sounds and intact distal pulses.  Exam reveals no gallop and no friction rub.   No murmur heard. Pulmonary/Chest: Effort normal and breath sounds normal. No respiratory distress. He has no wheezes. He has no rales. He exhibits no tenderness.  Neurological: He is alert and oriented to person, place, and time.  Skin: Skin is warm and dry. He is not diaphoretic.  Patient's oral mucosa dry in appearance.  Nursing note and vitals reviewed.   ED Course  Procedures (including critical care time)  Labs Review Labs Reviewed  POCT I-STAT, CHEM 8 - Abnormal; Notable for the following:    Sodium 134 (*)    Glucose, Bld >700 (*)    Calcium, Ion 1.28 (*)    All other components within normal limits   Results for orders placed or performed during the hospital encounter of 04/03/14  I-STAT, chem 8  Result Value Ref Range   Sodium 134 (L) 135 - 145 mmol/L  Potassium 4.8 3.5 - 5.1 mmol/L   Chloride 96 96 - 112 mEq/L   BUN 22 6 - 23 mg/dL   Creatinine, Ser 1.00 0.50 - 1.35 mg/dL   Glucose, Bld >700 (HH) 70 - 99 mg/dL   Calcium, Ion 1.28 (H) 1.12 - 1.23 mmol/L   TCO2 24 0 - 100 mmol/L   Hemoglobin 17.0 13.0 - 17.0 g/dL   HCT 50.0 39.0 - 52.0 %   Comment NOTIFIED PHYSICIAN    Intravenous fluids were administered,  normal saline 1000 ml bolus. Imaging Review No results found.   MDM   1. Hyperglycemia    Plan of care discussed with Dr. Juventino Slovak.  Patient transferred to [facility] ED for further workup and evaluation.  The patient verbalizes understanding and agrees to plan of care.       Nehemiah Settle, NP 04/03/14 1208

## 2014-04-04 LAB — BASIC METABOLIC PANEL
Anion gap: 5 (ref 5–15)
BUN: 9 mg/dL (ref 6–23)
CO2: 24 mmol/L (ref 19–32)
Calcium: 8.2 mg/dL — ABNORMAL LOW (ref 8.4–10.5)
Chloride: 110 mEq/L (ref 96–112)
Creatinine, Ser: 0.68 mg/dL (ref 0.50–1.35)
GFR calc non Af Amer: >90 mL/min (ref >90)
Glucose, Bld: 280 mg/dL — ABNORMAL HIGH (ref 70–99)
POTASSIUM: 3.9 mmol/L (ref 3.5–5.1)
SODIUM: 139 mmol/L (ref 135–145)

## 2014-04-04 LAB — GLUCOSE, CAPILLARY
GLUCOSE-CAPILLARY: 257 mg/dL — AB (ref 70–99)
GLUCOSE-CAPILLARY: 265 mg/dL — AB (ref 70–99)
Glucose-Capillary: 263 mg/dL — ABNORMAL HIGH (ref 70–99)
Glucose-Capillary: 249 mg/dL — ABNORMAL HIGH (ref 70–99)
Glucose-Capillary: 245 mg/dL — ABNORMAL HIGH (ref 70–99)

## 2014-04-04 LAB — LIPID PANEL
Cholesterol: 144 mg/dL (ref 0–200)
HDL: 20 mg/dL — AB (ref >39)
LDL CALC: 54 mg/dL (ref 0–99)
Total CHOL/HDL Ratio: 7.2 RATIO
Triglycerides: 352 mg/dL — ABNORMAL HIGH (ref <150)
VLDL: 70 mg/dL — ABNORMAL HIGH (ref 0–40)

## 2014-04-04 LAB — C-PEPTIDE: C-Peptide: 0.61 ng/mL — ABNORMAL LOW (ref 0.80–3.90)

## 2014-04-04 LAB — HEMOGLOBIN A1C
Hgb A1c MFr Bld: 11.6 % — ABNORMAL HIGH (ref <5.7)
Mean Plasma Glucose: 286 mg/dL — ABNORMAL HIGH (ref <117)

## 2014-04-04 LAB — TISSUE TRANSGLUTAMINASE, IGA: TISSUE TRANSGLUTAMINASE AB, IGA: 1 U/mL (ref <4)

## 2014-04-04 LAB — TSH: TSH: 0.77 u[IU]/mL (ref 0.350–4.500)

## 2014-04-04 MED ORDER — INSULIN STARTER KIT- SYRINGES (SPANISH)
1.0000 | Freq: Once | Status: AC
Start: 2014-04-04 — End: 2014-04-04
  Administered 2014-04-04: 1
  Filled 2014-04-04: qty 1

## 2014-04-04 MED ORDER — METFORMIN HCL 500 MG PO TABS
500.0000 mg | ORAL_TABLET | Freq: Two times a day (BID) | ORAL | Status: DC
  Administered 2014-04-04 – 2014-04-05 (×2): 500 mg via ORAL
  Filled 2014-04-04 (×4): qty 1

## 2014-04-04 MED ORDER — LIVING WELL WITH DIABETES BOOK - IN SPANISH
Freq: Once | Status: AC
  Administered 2014-04-04: 14:00:00
  Filled 2014-04-04: qty 1

## 2014-04-04 NOTE — Progress Notes (Addendum)
Inpatient Diabetes Program Recommendations  AACE/ADA: New Consensus Statement on Inpatient Glycemic Control (2013)  Target Ranges:  Prepandial:   less than 140 mg/dL      Peak postprandial:   less than 180 mg/dL (1-2 hours)      Critically ill patients:  140 - 180 mg/dL     Results for Billy Gregory, Billy Gregory (MRN [id number]) as of 04/04/2014 09:51  Ref. Range 04/03/2014 14:02  Sodium Latest Range: 135-145 mmol/L 138  Potassium Latest Range: 3.5-5.1 mmol/L 4.4  Chloride Latest Range: 96-112 mEq/L 106  CO2 Latest Range: 19-32 mmol/L 25  BUN Latest Range: 6-23 mg/dL 17  Creatinine Latest Range: 0.50-1.35 mg/dL 1.00  Calcium Latest Range: 8.4-10.5 mg/dL 9.4  GFR calc non Af Amer Latest Range: >90 mL/min >90  GFR calc Af Amer Latest Range: >90 mL/min >90  Glucose Latest Range: 70-99 mg/dL 546 (H)  Anion gap Latest Range: 5-15  7    34 y.o. male presenting with hyperglycemia in the setting of polyuria, polydipsia, and weight loss.  PMH none.  New diagnosis of DM.  Per MD notes, unclear if Type 1 or Type 2 DM.  Have ordered DM educational booklet in Spanish.  Have also ordered RD consult for this patient.  Will see patient today.   Addendum 1420pm: Spoke with pt and his wife about new diagnosis using Spanish interpreter.  Discussed A1C results with them and explained what an A1C is, basic pathophysiology of DM Type 2, basic home care, basic diabetes diet nutrition principles, importance of checking CBGs and maintaining good CBG control to prevent long-term and short-term complications.  Reviewed signs and symptoms of hyperglycemia and hypoglycemia and how to treat hypoglycemia at home.  Also reviewed blood sugar goals at home.    RNs to provide ongoing basic DM education at bedside with this patient.  Have ordered educational booklet, insulin starter kit, and DM videos.  Have also placed RD consult for DM diet education for this patient.    Wyn Quaker RN, MSN,  CDE Diabetes Coordinator Inpatient Diabetes Program Team Pager: [phone] (8a-10p)

## 2014-04-04 NOTE — Discharge Summary (Signed)
[facility] Discharge Summary  Patient name: Billy Gregory Medical record number: [id number] Date of birth: 1980/04/16 Age: 34 y.o. Gender: male Date of Admission: 04/03/2014  Date of Discharge: 04/05/2014  Admitting Physician: Lind Covert, MD  Primary Care Provider: No PCP Per Patient Consultants: none  Indication for Hospitalization: hyperglycemia  Discharge Diagnoses/Problem List:  New onset diabetes mellitus, most likely Type 2  Disposition: home  Discharge Condition: stable  Discharge Exam: See progress note from day of discharge  Brief Hospital Course:   Billy Gregory is a previously healthy 34 y.o. male presenting with hyperglycemia in the setting of polyuria, polydipsia, and weight loss x1wk. Initial CBG >700. No evidence of DKA with no ketones in urine, normal AG, and normal bicarb on admission.  He was treated with IVF hydration, Lantus, and SSI.  Labs were sent to determine if this was type 1 or 2 DM, and they showed slightly low c-peptide (0.61) and A1c 11.6.  Other results still pending at time of discharge. Patient received diabetic education.  CBGs greatly improve (~250) prior to discharge and patient was discharged on Lantus 15 units daily and Metformin 535m BID.  Case management was consulted on admission to help with affordability of medications at discharge and getting a PCP.   Issues for Follow Up:  - f/u BG control - could likely switch to all oral agents in the future if Ab labs show T2DM, not T1 - f/u T1DM antibodies - consider endocrine consult if T1DM  Significant Procedures: none  Significant Labs and Imaging:   Recent Labs Lab 04/03/14 1139 04/03/14 1402  WBC  --  8.8  HGB 17.0 14.2  HCT 50.0 39.4  PLT  --  181    Recent Labs Lab 04/03/14 1139 04/03/14 1402 04/03/14 1926 04/04/14 0501 04/05/14 0631  NA 134* 138 143 139 133*  K 4.8 4.4 3.7 3.9 3.6  CL 96 106 112 110 103  CO2  --  25  21 24 24   GLUCOSE >700* 546* 301* 280* 256*  BUN 22 17 11 9 10   CREATININE 1.00 1.00 0.73 0.68 0.74  CALCIUM  --  9.4 8.6 8.2* 8.5  ALKPHOS  --  91  --   --   --   AST  --  30  --   --   --   ALT  --  59*  --   --   --   ALBUMIN  --  3.8  --   --   --     TSH 0.770  Lipid Panel     Component Value Date/Time   CHOL 144 04/04/2014 0501   TRIG 352* 04/04/2014 0501   HDL 20* 04/04/2014 0501   CHOLHDL 7.2 04/04/2014 0501   VLDL 70* 04/04/2014 0501   LDLCALC 54 04/04/2014 0501    Hgb A1c - 11.6  C peptide - 0.61   Results/Tests Pending at Time of Discharge: insulin level, islet cell antibodies  Discharge Medications:    Medication List    STOP taking these medications        amoxicillin-clavulanate 875-125 MG per tablet  Commonly known as:  AUGMENTIN     HYDROcodone-acetaminophen 5-500 MG per tablet  Commonly known as:  VICODIN      TAKE these medications        acetaminophen 325 MG tablet  Commonly known as:  TYLENOL  Take 650 mg by mouth every 6 (six) hours as needed.     glucose  blood test strip  Commonly known as:  RELION GLUCOSE TEST STRIPS  Test your blood sugar daily in the morning or when feeling badly.     insulin glargine 100 UNIT/ML injection  Commonly known as:  LANTUS  Inject 0.15 mLs (15 Units total) into the skin daily.  Start taking on:  04/06/2014     metFORMIN 500 MG tablet  Commonly known as:  GLUCOPHAGE  Take 1 tablet (500 mg total) by mouth 2 (two) times daily with a meal.     RELION CONFIRM GLUCOSE MONITOR W/DEVICE Kit  1 each by Does not apply route once.        Discharge Instructions: Please refer to Patient Instructions section of EMR for full details.  Patient was counseled important signs and symptoms that should prompt return to medical care, changes in medications, dietary instructions, activity restrictions, and follow up appointments.   Follow-Up Appointments:   Lavon Paganini, MD 04/05/2014, 2:27 PM PGY-1, [place]

## 2014-04-04 NOTE — Progress Notes (Signed)
Utilization review completed. Kindal Ponti, RN, BSN. 

## 2014-04-04 NOTE — Progress Notes (Signed)
Family Medicine Teaching Service Daily Progress Note Intern Pager: [phone]  Patient name: Billy Gregory Medical record number: [id number] Date of birth: [birth year] Age: 34 y.o. Gender: male  Primary Care Provider: No PCP Per Patient Consultants: none Code Status: full  Pt Overview and Major Events to Date:  1/10 - admit for hyperglycemia  Assessment and Plan:  Billy Gregory is a 34 y.o. male presenting with hyperglycemia in the setting of polyuria, polydipsia, and weight loss. PMH none.   New-onset diabetes: CBGs improving on SSI. Patient initially presented with CBG >700. Lab work unremarkable for ketones in urine, AG normal, and bicarbonate normal. S/p 3 NS boluses.  Unsure whether this is type I or type 2 diabetes.  - will need diabetes education, consult to DM coordinator -Labs: A1c, C-peptide, BMP, insulin, islet cell antibodies pending - ASCVD lifetime risk of 39% (cannot calc 41yr risk for <40) -SSI increased to moderate -Lantus 10 units this AM and will titrate up as necessary - May be able to start oral therapy if labs show T2DM - Monitor CBGs closely - 174-081 O/N  #Social: Patient currently without insurance and no PCP.  -Case management consulted  FEN/GI: Carb modified diet; SLIV as pt is taking PO Prophylaxis: Lovenox  Disposition: Pending diabetic teaching and completion of w/u  Subjective:  Feeling well. Is worried about eating his breakfast because he knows that food affects BG.  Objective: Temp:  [97.4 F (36.3 C)-99 F (37.2 C)] 99 F (37.2 C) (01/11 0616) Pulse Rate:  [72-112] 72 (01/11 0616) Resp:  [16-24] 18 (01/11 0616) BP: (109-150)/(60-106) 109/73 mmHg (01/11 0616) SpO2:  [94 %-100 %] 99 % (01/11 0616) Weight:  [171 lb 8.3 oz (77.8 kg)] 171 lb 8.3 oz (77.8 kg) (01/10 1751) Physical Exam: General: alert, well-developed, NAD, pleasant and cooperative HEENT: vision grossly intact, no injection and anicteric. EOMI. MMM. Neck  supple. Lungs: CTAB, normal respiratory effort, no crackles, and no wheezes. Heart: RRR, no M/R/G.  Abdomen: Bowel sounds normal; abdomen soft and nontender. Extremities: No cyanosis, clubbing, edema Neurologic: No focal deficits, A&Ox3.   Laboratory:  Recent Labs Lab 04/03/14 1139 04/03/14 1402  WBC  --  8.8  HGB 17.0 14.2  HCT 50.0 39.4  PLT  --  181    Recent Labs Lab 04/03/14 1402 04/03/14 1926 04/04/14 0501  NA 138 143 139  K 4.4 3.7 3.9  CL 106 112 110  CO2 25 21 24   BUN 17 11 9   CREATININE 1.00 0.73 0.68  CALCIUM 9.4 8.6 8.2*  PROT 6.7  --   --   BILITOT 2.1*  --   --   ALKPHOS 91  --   --   ALT 59*  --   --   AST 30  --   --   GLUCOSE 546* 301* 280*    TSH 0.770  Lipid Panel     Component Value Date/Time   CHOL 144 04/04/2014 0501   TRIG 352* 04/04/2014 0501   HDL 20* 04/04/2014 0501   CHOLHDL 7.2 04/04/2014 0501   VLDL 70* 04/04/2014 0501   LDLCALC 54 04/04/2014 0501    Imaging/Diagnostic Tests: none  Billy Paganini, MD 04/04/2014, 7:02 AM PGY-1, [place] Intern pager: [phone], text pages welcome

## 2014-04-04 NOTE — Plan of Care (Signed)
Problem: Food- and Nutrition-Related Knowledge Deficit (NB-1.1) Goal: Nutrition education Formal process to instruct or train a patient/client in a skill or to impart knowledge to help patients/clients voluntarily manage or modify food choices and eating behavior to maintain or improve health. Outcome: Adequate for Discharge  RD consulted for nutrition education regarding diabetes.   Pt is Spanish speaking. Interpreter Henrine Screws used during education session.  No results found for: HGBA1C  RD provided "Carbohydrate Counting for People with Diabetes" (Spanish Language Version) handout from the Academy of Nutrition and Dietetics. Also provided plate method handout. Discussed different food groups and their effects on blood sugar, emphasizing carbohydrate-containing foods. Provided list of carbohydrates and recommended serving sizes of common foods.  Discussed importance of controlled and consistent carbohydrate intake throughout the day. Provided examples of ways to balance meals/snacks and encouraged intake of high-fiber, whole grain complex carbohydrates. Teach back method used.  Expect good compliance.  Cannot calculate BMI with a height equal to zero.  Current diet order is carb modified, patient is consuming approximately 100% of meals at this time. Labs and medications reviewed. No further nutrition interventions warranted at this time. RD contact information provided. If additional nutrition issues arise, please re-consult RD.  Wille Aubuchon A. Jimmye Norman, RD, LDN, CDE Pager: [phone] After hours Pager: [phone]

## 2014-04-04 NOTE — Progress Notes (Signed)
  PT WAS AFRAID TO GIVE SELF INSULIN. HE SAID HE WOULD TRY  AT BEDTIME.

## 2014-04-04 NOTE — Progress Notes (Signed)
Pt given handouts on what diabetes is as well as what hyperglycemia is in spanish to read over. Pt encouraged to ask any questions he may have. Pt to meet with diabetic coordinator tomorrow.

## 2014-04-05 LAB — GLUCOSE, CAPILLARY
GLUCOSE-CAPILLARY: 353 mg/dL — AB (ref 70–99)
Glucose-Capillary: 243 mg/dL — ABNORMAL HIGH (ref 70–99)

## 2014-04-05 LAB — BASIC METABOLIC PANEL
ANION GAP: 6 (ref 5–15)
BUN: 10 mg/dL (ref 6–23)
CO2: 24 mmol/L (ref 19–32)
Calcium: 8.5 mg/dL (ref 8.4–10.5)
Chloride: 103 mEq/L (ref 96–112)
Creatinine, Ser: 0.74 mg/dL (ref 0.50–1.35)
GFR calc Af Amer: >90 mL/min (ref >90)
GFR calc non Af Amer: >90 mL/min (ref >90)
Glucose, Bld: 256 mg/dL — ABNORMAL HIGH (ref 70–99)
POTASSIUM: 3.6 mmol/L (ref 3.5–5.1)
SODIUM: 133 mmol/L — AB (ref 135–145)

## 2014-04-05 MED ORDER — INSULIN GLARGINE 100 UNIT/ML ~~LOC~~ SOLN: 15.0000 [IU] | Freq: Every day | SUBCUTANEOUS | Status: DC

## 2014-04-05 MED ORDER — INSULIN GLARGINE 100 UNIT/ML ~~LOC~~ SOLN
15.0000 [IU] | Freq: Every day | SUBCUTANEOUS | Status: DC
Start: 2014-04-06 — End: 2014-04-05

## 2014-04-05 MED ORDER — GLUCOSE BLOOD VI STRP: ORAL_STRIP | Status: DC

## 2014-04-05 MED ORDER — RELION CONFIRM GLUCOSE MONITOR W/DEVICE KIT: 1.0000 | PACK | Freq: Once | Status: DC

## 2014-04-05 MED ORDER — METFORMIN HCL 500 MG PO TABS: 500.0000 mg | ORAL_TABLET | Freq: Two times a day (BID) | ORAL | Status: DC

## 2014-04-05 MED ORDER — INSULIN GLARGINE 100 UNIT/ML ~~LOC~~ SOLN
5.0000 [IU] | Freq: Once | SUBCUTANEOUS | Status: AC
  Administered 2014-04-05: 5 [IU] via SUBCUTANEOUS
  Filled 2014-04-05: qty 0.05

## 2014-04-05 NOTE — Progress Notes (Signed)
NURSING PROGRESS NOTE  Billy Gregory [id number] Discharge Data: 04/05/2014 2:13 PM Attending Provider: Lind Covert, MD PCP:No PCP Per Patient     [name] to be D/C'd Home per MD order.  Discussed with the patient the After Visit Summary and all questions fully answered. All IV's discontinued with no bleeding noted. All belongings returned to patient for patient to take home.   Last Vital Signs:  Blood pressure 119/72, pulse 74, temperature 98.1 F (36.7 C), temperature source Oral, resp. rate 17, height 0' (0 m), weight 78 kg (171 lb 15.3 oz), SpO2 100 %.  Discharge Medication List   Medication List    STOP taking these medications        amoxicillin-clavulanate 875-125 MG per tablet  Commonly known as:  AUGMENTIN     HYDROcodone-acetaminophen 5-500 MG per tablet  Commonly known as:  VICODIN      TAKE these medications        acetaminophen 325 MG tablet  Commonly known as:  TYLENOL  Take 650 mg by mouth every 6 (six) hours as needed.     glucose blood test strip  Commonly known as:  RELION GLUCOSE TEST STRIPS  Test your blood sugar daily in the morning or when feeling badly.     insulin glargine 100 UNIT/ML injection  Commonly known as:  LANTUS  Inject 0.15 mLs (15 Units total) into the skin daily.  Start taking on:  04/06/2014     metFORMIN 500 MG tablet  Commonly known as:  GLUCOPHAGE  Take 1 tablet (500 mg total) by mouth 2 (two) times daily with a meal.     RELION CONFIRM GLUCOSE MONITOR W/DEVICE Kit  1 each by Does not apply route once.

## 2014-04-05 NOTE — Discharge Instructions (Signed)
You were admitted for high blood sugars.  This is due to diabetes.  You will be taking Metformin (a pill twice daily) and Lantus insulin (1 injection daily of 15 units).  You should check your blood sugars at least once daily in the mornings when you first wake up.  You should also check your blood sugar when you are feeling badly.  Tratamiento con insulina para la diabetes (Insulin Treatment for Diabetes) La diabetes es una enfermedad que no se cura (crnica). Se produce cuando el cuerpo no Arts development officer (glucosa) que se libera de los alimentos despus de su digestin. Una hormona llamada insulina que produce el pncreas controla los niveles de [place]. Segn sea el tipo de diabetes que tenga, se aplicar:   El pncreas no produce nada de insulina (diabetes tipo1).  El pncreas produce muy poca insulina, y el organismo no puede responder normalmente a la insulina que produce (diabetes tipo2). Sin insulina se pone en peligro la vida. Sin embargo, con la adicin de [place], el control de la glucosa en la sangre y el tratamiento una persona que sufre diabetes puede vivir una vida plena y productiva. En este documento veremos qu papel tiene la insulina en su tratamiento y le daremos informacin sobre su uso.  CMO SE ADMINISTRA LA INSULINA? La insulina es un medicamento que solo se administra por va inyectable. No puede tomarse por va oral porque el cido del estmago la inactivara. La insulina se inyecta debajo de la piel con Clent Jacks y [place], un lpiz de [place], una bomba o un inyector de insulina tipo jet. El mdico determinar la dosis segn sus necesidades individuales. Tambin le aconsejarn cul mtodo de administracin de la insulina es el adecuado para usted. Recuerde que si se aplica la insulina con una aguja y Clent Jacks, solo debe usar la jeringa especial para insulina, hecha para este propsito. EN QU LUGAR DEL CUERPO DEBE INYECTARSE [place]? La  insulina se inyecta en la capa de tejido graso que se encuentra debajo de la piel. Los mejores lugares para inyectar la insulina son la zona superior del brazo, la zona anterior externa del muslo, la cadera y el abdomen. Se prefiere la administracin de la insulina en el abdomen ya que proporciona una absorcin rpida y consistente. Evite la zona que se encuentra a 2 pulgadas (5 cm) alrededor del ombligo y Catering manager en zonas en las que haya tejido cicatrizal. Adems, es importante alternar los lugares de inyeccin en cada aplicacin, para Mining engineer irritacin y Teacher, English as a foreign language la absorcin  CULES SON LOS DIFERENTES TIPOS [place]?  Si usted tiene diabetes tipo 1 tiene que recibir insulina para sobrevivir. Su organismo no la produce. Si usted tiene diabetes tipo 2 puede requerir [employer] de, o en lugar de, otros tratamientos. En cada caso, el uso adecuado de la insulina es fundamental para controlar la diabetes.  Hay diferentes tipos de insulina. Generalmente las inyecciones se las aplica el mismo paciente, aunque la aplicacin tambin puede estar a cargo de otras personas previamente entrenadas. Algunas personas usan una bomba que administra insulina de forma continua a travs de un tubo (cnula) que se coloca debajo de la piel. El uso de [place] requiere el control de los niveles de Location manager en la sangre varias veces al da. El nmero exacto de veces y el momento del da en que deber controlar la glucosa variar segn el tipo de diabetes, el tipo de [place] y los objetivos del [place]. El mdico  lo guiar.  En general, los diferentes tipos de insulina tienen diferentes propiedades. La siguiente es una gua general. Las especificaciones varan segn el producto y peridicamente se introducen nuevos productos.   La insulina de accin rpida comienza a Chief of Staff rpidamente (en menos de 86minutos) y desaparece en 4a 6horas (a veces ms). Este tipo de insulina funciona bien cuando se aplica  antes de una comida para llevar rpidamente el nivel de azcar en la sangre a la normalidad.  La insulina de accin corta comienza a actuar aproximadamente a los 22minutos y puede durar entre 6 y 10horas. Este tipo de insulina debe aplicarse aproximadamente 30 minutos antes de comenzar a Scientist, research (physical sciences).  La insulina de accin intermedia comienza a actuar en 1a 2horas y desaparece despus de aproximadamente 10a 18horas. Esta insulina disminuir el nivel de azcar en la sangre durante un perodo prolongado, pero no ser tan efectiva para disminuir el nivel de azcar en la sangre inmediatamente despus de una comida.  La insulina de accin prolongada imita la pequea cantidad de insulina que su pncreas produce habitualmente Agricultural consultant. Es necesario que tenga siempre algo de [place]. Es fundamental para el metabolismo de las neuronas y Production assistant, radio. La insulina de accin prolongada debe ser Jiles Garter una o dos veces al da. Por lo general se utiliza en combinacin con otros tipos de insulina o en combinacin con otros medicamentos para la diabetes. Comente el tipo de insulina que toma con su mdico o Development worker, international aid. Luego debe conocer cundo puede esperar un pico de insulina y cuando [employer]. Es importante que lo sepa de modo que pueda planificar las horas de la comida y los perodos de actividad fsica.  Generalmente, el mdico tendr en mente una estrategia de tratamiento con insulina. Esto variar con el tipo de diabetes, los objetivos de tratamiento y su historia clnica. Es importante que usted comprenda esta estrategia para que pueda participar de su tratamiento para la diabetes. Estos son algunos de los trminos que podra escuchar:   Insulina basal. Se refiere a la pequea cantidad de insulina que debe estar presente en la sangre en todo momento. En algunos casos sern suficientes los medicamentos por va oral. En otros casos, y especialmente para las personas con diabetes  tipo 1, es Tax adviser insulina. Generalmente se administra insulina de accin intermedia o de accin prolongada una o dos veces al da para Systems developer.  Insulina prandial (relacionada con las comidas). El nivel de azcar en la sangre se elevar rpidamente despus de una comida. Se puede administrar insulina de accin corta o insulina de accin rpida inmediatamente antes de la comida para que el nivel de azcar en la sangre vuelva a la normalidad rpidamente. Es posible que le indiquen que ajuste la cantidad de insulina en funcin de la cantidad de hidratos de carbono (almidones) que contenga la comida.  Insulina correctiva. Posiblemente le indiquen que controle su nivel de azcar en la sangre en algunos momentos del da. Entonces podr [employer] pequea cantidad de insulina de accin rpida o de accin corta para que el nivel de azcar en la sangre vuelva a la normalidad, si es elevado.  Control estricto (tambin llamado tratamiento intensivo). El control Pharmacologist el nivel de azcar en la sangre lo ms cerca del objetivo como sea posible y Product/process development scientist que aumente demasiado despus de las comidas. Se ha demostrado que las personas con un control estricto de la diabetes tienen menos problemas relacionados con la enfermedad a Production designer, theatre/television/film  plazo.  Nivel de glucohemoglobina (tambin llamada gluco, hemoglobina glucosilada, hemoglobina A1c o A1c). Con esta prueba se mide si la glucosa fue controlada correctamente en los ltimos 1 a 3 meses. Esto ayuda al mdico a comprobar si el tratamiento es efectivo y decidir si es necesario realizar algunos cambios. El Astronomer con usted el nivel de glucohemoglobina objetivo. El tratamiento con insulina requiere que preste una especial atencin. Los planes de tratamiento sern distintos para cada persona. Algunas personas obtienen buenos resultados con un programa simple. Otros requieren programas ms complicados con mltiples inyecciones diarias  de insulina. Usted trabajar junto con su mdico para desarrollar el programa que mejor le [place]. Independientemente de su plan de tratamiento con insulina, deber hacer todo lo posible para controlar el peso, la dieta y Marine scientist de alimentos, los ejercicios, el control de la presin arterial, el control del colesterol y los niveles de Psychologist, forensic.  CULES SON LOS EFECTOS ADVERSOS DE LA INSULINA? Aunque el tratamiento con insulina es muy importante, tiene algunos [place], como:   La insulina puede hacer que su nivel de azcar en la sangre disminuya mucho (hipoglucemia).  Puede causar [employer].  Una tcnica incorrecta de inyeccin puede causar hipoglucemia, un nivel muy alto de azcar en la sangre (hiperglucemia), irritacin o lesiones en la piel, u otros problemas. Debe aprender a inyectarse la insulina correctamente. Document Released: 09/10/2011 Document Revised: 07/26/2013 [facility] Patient Information 2015 [place]. This information is not intended to replace advice given to you by your health care provider. Make sure you discuss any questions you have with your health care provider.

## 2014-04-05 NOTE — Progress Notes (Signed)
CARE MANAGEMENT NOTE 04/05/2014  Patient:  Billy Gregory,Billy Gregory   Account Number:  [id number]  Date Initiated:  04/05/2014  Documentation initiated by:  [facility]  Subjective/Objective Assessment:   hyperglycemia     Action/Plan:   lives with wife.   Anticipated DC Date:  04/05/2014   Anticipated DC Plan:  [place]  CM consult  Medication Assistance  [facility]      Choice offered to / List presented to:             Status of service:  Completed, signed off Medicare Important Message given?  NO (If response is "NO", the following Medicare IM given date fields will be blank) Date Medicare IM given:   Medicare IM given by:   Date Additional Medicare IM given:   Additional Medicare IM given by:    Discharge Disposition:  HOME/SELF CARE  Per UR Regulation:    If discussed at Long Length of Stay Meetings, dates discussed:    Comments:  04/05/2014 1420 NCM spoke to pt and states he currently is out of work for the season. His work will start when the weather is warmer. Lives at home with wife who speaks [place]. Provided pt with brochure for the [facility] to call in am for hospital follow up appt for Friday. Contacted [place] and schedule closed until tomorrow. Provided pt with [place] letter and explained to wife he can receive his meds for $3.00 copay at pharmacies listed. And explained program can only be used once per year. Jonnie Finner RN CCM Case Mgmt phone [phone]

## 2014-04-05 NOTE — Progress Notes (Signed)
Inpatient Diabetes Program Recommendations  AACE/ADA: New Consensus Statement on Inpatient Glycemic Control (2013)  Target Ranges:  Prepandial:   less than 140 mg/dL      Peak postprandial:   less than 180 mg/dL (1-2 hours)      Critically ill patients:  140 - 180 mg/dL     Results for JALEIL, RENWICK (MRN [id number]) as of 04/05/2014 14:17  Ref. Range 04/05/2014 08:08 04/05/2014 11:38  Glucose-Capillary Latest Range: 70-99 mg/dL 243 (H) 353 (H)    Results for FACUNDO, ALLEMAND (MRN [id number]) as of 04/05/2014 14:17  Ref. Range 04/03/2014 19:26  Hgb A1c MFr Bld Latest Range: <5.7 % 11.6 (H)    Results for KARMELO, BASS (MRN [id number]) as of 04/05/2014 14:17  Ref. Range 04/04/2014 05:01  C-Peptide Latest Range: 0.80-3.90 ng/mL 0.61 (L)     **Spoke with patient yesterday (01/11) using Patent attorney.  **Note CBG up at lunch today to 353 mg/dl.    **Per patient and his wife, patient has given a few insulin injections so far.  **Patient and his wife had a few questions today for me.  Answered all questions and encouraged patient to make sure to keep all appointments at the [facility] clinic.     Will follow Wyn Quaker RN, MSN, CDE Diabetes Coordinator Inpatient Diabetes Program Team Pager: [phone] (8a-10p)

## 2014-04-05 NOTE — Progress Notes (Signed)
Family Medicine Teaching Service Daily Progress Note Intern Pager: [phone]  Patient name: Billy Gregory Medical record number: [id number] Date of birth: 1980/10/17 Age: 34 y.o. Gender: male  Primary Care Provider: No PCP Per Patient Consultants: none Code Status: full  Pt Overview and Major Events to Date:  1/10 - admit for hyperglycemia  Assessment and Plan:  Billy Gregory is a 34 y.o. male presenting with hyperglycemia in the setting of polyuria, polydipsia, and weight loss. PMH none.   New-onset diabetes: CBGs improving on SSI. Patient initially presented with CBG >700. Lab work unremarkable for ketones in urine, AG normal, and bicarbonate normal. S/p 3 NS boluses.  Unsure whether this is type I or type 2 diabetes.  - will need diabetes education, consult to DM coordinator -Labs: A1c 11.6, C-peptide low at 0.61, insulin, islet cell antibodies pending - ASCVD lifetime risk of 39% (cannot calc 31yr risk for <40) - mod SSI - required 23 units O/N - Lantus 10 units daily - will titrate up today and further prn - Metformin 500mg  BID - Monitor CBGs closely - 245-265 O/N  #Social: Patient currently without insurance and no PCP.  -Case management consulted  FEN/GI: Carb modified diet; SLIV as pt is taking PO Prophylaxis: Lovenox  Disposition: Pending diabetic teaching and completion of w/u  Subjective:  Feeling well.  RN notes say that patient is afraid to give himself insulin, but he did it himself this AM. Ready to go home.  Objective: Temp:  [98.1 F (36.7 C)-98.3 F (36.8 C)] 98.1 F (36.7 C) (01/12 0541) Pulse Rate:  [74-76] 74 (01/11 2119) Resp:  [16-20] 17 (01/12 0541) BP: (119-136)/(72-81) 119/72 mmHg (01/12 0541) SpO2:  [99 %-100 %] 100 % (01/12 0541) Physical Exam: General: alert, well-developed, NAD, pleasant and cooperative HEENT: vision grossly intact, no injection and anicteric. EOMI. MMM. Neck supple. Lungs: CTAB, normal respiratory effort,  no crackles, and no wheezes. Heart: RRR, no M/R/G.  Abdomen: Bowel sounds normal; abdomen soft and nontender. Extremities: No cyanosis, clubbing, edema Neurologic: No focal deficits, A&Ox3.   Laboratory:  Recent Labs Lab 04/03/14 1139 04/03/14 1402  WBC  --  8.8  HGB 17.0 14.2  HCT 50.0 39.4  PLT  --  181    Recent Labs Lab 04/03/14 1402 04/03/14 1926 04/04/14 0501  NA 138 143 139  K 4.4 3.7 3.9  CL 106 112 110  CO2 25 21 24   BUN 17 11 9   CREATININE 1.00 0.73 0.68  CALCIUM 9.4 8.6 8.2*  PROT 6.7  --   --   BILITOT 2.1*  --   --   ALKPHOS 91  --   --   ALT 59*  --   --   AST 30  --   --   GLUCOSE 546* 301* 280*    TSH 0.770  Hgb A1c - 11.6  C peptide - 0.61  Lipid Panel     Component Value Date/Time   CHOL 144 04/04/2014 0501   TRIG 352* 04/04/2014 0501   HDL 20* 04/04/2014 0501   CHOLHDL 7.2 04/04/2014 0501   VLDL 70* 04/04/2014 0501   LDLCALC 54 04/04/2014 0501    Imaging/Diagnostic Tests: none  Lavon Paganini, MD 04/05/2014, 8:33 AM PGY-1, [place] Intern pager: [phone], text pages welcome

## 2014-04-07 LAB — GLUTAMIC ACID DECARBOXYLASE AUTO ABS

## 2014-04-07 LAB — INSULIN, RANDOM: Insulin: 4.4 u[IU]/mL (ref 3–28)

## 2014-04-08 ENCOUNTER — Ambulatory Visit: Payer: Self-pay | Attending: Internal Medicine | Admitting: Internal Medicine

## 2014-04-08 ENCOUNTER — Encounter: Payer: Self-pay | Admitting: Internal Medicine

## 2014-04-08 VITALS — BP 147/92 | HR 89 | Temp 98.6°F | Resp 16 | Ht 65.5 in | Wt 172.0 lb

## 2014-04-08 DIAGNOSIS — F17210 Nicotine dependence, cigarettes, uncomplicated: Secondary | ICD-10-CM | POA: Insufficient documentation

## 2014-04-08 DIAGNOSIS — Z794 Long term (current) use of insulin: Secondary | ICD-10-CM | POA: Insufficient documentation

## 2014-04-08 DIAGNOSIS — Z79899 Other long term (current) drug therapy: Secondary | ICD-10-CM | POA: Insufficient documentation

## 2014-04-08 DIAGNOSIS — E119 Type 2 diabetes mellitus without complications: Secondary | ICD-10-CM

## 2014-04-08 DIAGNOSIS — B351 Tinea unguium: Secondary | ICD-10-CM

## 2014-04-08 DIAGNOSIS — Z23 Encounter for immunization: Secondary | ICD-10-CM

## 2014-04-08 DIAGNOSIS — Z720 Tobacco use: Secondary | ICD-10-CM

## 2014-04-08 DIAGNOSIS — F17200 Nicotine dependence, unspecified, uncomplicated: Secondary | ICD-10-CM

## 2014-04-08 DIAGNOSIS — IMO0001 Reserved for inherently not codable concepts without codable children: Secondary | ICD-10-CM

## 2014-04-08 LAB — GLUCOSE, POCT (MANUAL RESULT ENTRY): POC Glucose: 294 mg/dl — AB (ref 70–99)

## 2014-04-08 MED ORDER — RELION LANCETS MICRO-THIN 33G MISC: Status: DC

## 2014-04-08 MED ORDER — INSULIN ASPART 100 UNIT/ML ~~LOC~~ SOLN
10.0000 [IU] | Freq: Once | SUBCUTANEOUS | Status: AC
  Administered 2014-04-08: 10 [IU] via SUBCUTANEOUS

## 2014-04-08 MED ORDER — METFORMIN HCL 1000 MG PO TABS: 1000.0000 mg | ORAL_TABLET | Freq: Two times a day (BID) | ORAL | Status: DC

## 2014-04-08 MED ORDER — INSULIN GLARGINE 100 UNIT/ML ~~LOC~~ SOLN: 15.0000 [IU] | Freq: Every day | SUBCUTANEOUS | Status: DC

## 2014-04-08 MED ORDER — GLUCOSE BLOOD VI STRP: ORAL_STRIP | Status: DC

## 2014-04-08 NOTE — Patient Instructions (Addendum)
Dejar de fumar (Smoking Cessation) Dejar de fumar es importante para su salud y tiene Product/process development scientist. Sin embargo, no siempre es Control and instrumentation engineer de fumar ya que la nicotina es una droga [place]. Generalmente, las personas intentan 3 veces o ms antes de poder dejar de fumar. En este documento se explican las mejores formas de prepararse para dejar de fumar. Esta decisin requiere [employer] y mucho esfuerzo, pero usted puede [place]. [place]  Vivir ms, se sentir mejor y vivir mejor.  El cuerpo sentir el impacto de dejar de fumar de inmediato.  Luego de 20 minutos la presin arterial disminuye. El pulso vuelve a su nivel normal.  Despus de 8 horas, los niveles de monxido de carbono en la sangre vuelven a la normalidad. Aumenta el nivel de oxgeno.  Despus de 24 horas, la probabilidad de infarto comienza a disminuir. La respiracin, el cabello y el cuerpo ya no huelen a humo.  Luego de 48 horas, los nervios daados comienzan a recuperarse. Mejoran el sentido del gusto y Armed forces logistics/support/administrative officer.  Luego de 72 horas, el organismo est virtualmente libre de nicotina. Los conductos bronquiales se relajan, la respiracin se normaliza.  Despus de 2 a 12 semanas, los pulmones pueden contener ms aire. Es ms fcil realizar actividad fsica y [employer] respiracin.  El riesgo de sufrir un infarto, un ictus, cncer o enfermedad pulmonar disminuye en gran medida.  Despus de 1 ao, el riesgo de coronariopatas disminuye a la mitad.  Despus de 5 aos, el riesgo de ictus disminuye al nivel de un no fumador.  Despus de 10 aos, el riesgo de cncer de pulmn disminuye a la mitad, y el riesgo de sufrir otros tipos de cncer disminuye considerablemente.  Despus de 15 aos, el riesgo de enfermedad coronaria disminuye, generalmente al nivel de un no fumador.  Si est embarazada, al dejar de fumar aumentar las probabilidades de tener un beb sano.  Las personas con las que convive,  [employer] nios, estarn ms saludables.  Tendr dinero extra para gastar en otras cosas que no sean cigarrillos. PREGUNTAS PARA PENSAR ANTES DE Billy Gregory DEJAR DE FUMAR Quizs desee hablar acerca de sus respuestas con el mdico.  Por qu desea dejar de fumar?  Cuando trat de dejar de fumar en el pasado, qu lo ayud y qu no lo ayud?  Cules sern las situaciones ms difciles para usted despus de dejar de fumar? Cmo planea manejarlas?  Quin puede ayudarlo en los momentos difciles? Su familia? Sus amigos? El mdico?  Qu placeres obtiene cuando fuma? De qu manera puede seguir obteniendo placer si abandona el hbito? Estas son algunas preguntas que puede hacerle al mdico:  Cmo puede ayudarme a dejar de fumar con xito?  Qu medicamento cree que sera el mejor para m, y cmo debo tomarlo?  Qu debo hacer si necesito ms ayuda?  Cmo es la desintoxicacin del cigarrillo? Cmo puedo obtener informacin acerca de la desintoxicacin? PREPRESE  Establezca una fecha para dejar de fumar.  Cambie su entorno, deshacindose de los cigarrillos, ceniceros, fsforos y encendedores en su casa, el auto o el [place]. No permita que nadie fume dentro de su casa.  Repase sus intentos anteriores. Piense en qu cosas funcionaron y cules no. BUSQUE AYUDA Y ESTMULO Usted tiene mejores probabilidades de tener xito si cuenta con ayuda. Puede obtener apoyo de [employer]:  Dgales a sus familiares, amigos y compaeros de trabajo que usted dejar de fumar y que necesita su apoyo.  Pdales que no fumen a su alrededor.  Obtenga consejo y 31 individual, grupal o telefnico. Hay programas que se ofrecen en hospitales y centros mdicos locales. Comunquese con el departamento de salud de su localidad para obtener informacin acerca de los programas disponibles en su rea.  Las creencias y prcticas espirituales pueden ayudar a los fumadores a abandonar el  hbito.  Descargue en su computadora un programa que registre sus estadsticas, por ejemplo, cunto hace que no fuma, la cantidad de cigarrillos que no ha fumado y el dinero ahorrado.  Consiga un libro de [place] sobre dejar de fumar y alejarse del tabaco. APRENDA NUEVAS DESTREZAS Y CONDUCTAS  Trate de entretenerse con otra cosa cuando sienta ganas de fumar. Hable con alguien, salga a caminar u ocpese en alguna tarea.  Cambie su rutina habitual. Salley Hews ruta diferente para llegar al [place]. Beba t en vez de caf. Desayune en un lugar diferente.  Reduzca las situaciones de estrs. Tome un bao caliente, practique alguna actividad fsica o lea un libro.  Planee hacer cada da algo que disfrute. Recompnsese por no fumar.  Explore programas interactivos en la web dedicados a ayudar a dejar de fumar. CONSIGA MEDICAMENTOS Y SELOS CORRECTAMENTE Algunos medicamentos pueden ayudar a dejar de fumar y Equities trader la necesidad de tabaco. Oceanographer los medicamentos con las conductas y mtodos de apoyo ya mencionados puede aumentar en gran medida sus posibilidades de dejar de fumar con xito.  La terapia de reemplazo de nicotina enva nicotina al organismo sin los efectos negativos y los riesgos del fumar. La terapia de reemplazo de nicotina incluye chicles, pastillas, inhaladores, aerosoles nasales y parches para la piel de nicotina. Algunos son de Rogelia Rohrer y otros requieren una receta mdica.  Los antidepresivos ayudan a las personas a Personal assistant de Copy, Armed forces training and education officer no se conoce cul es el mecanismo. Se venden bajo receta mdica.  Los [employer] parciales de los receptores de nicotina simulan el efecto de la nicotina en el cerebro. Se venden bajo receta mdica. Pdale al mdico que lo aconseje [employer] medicamentos que debe Risk manager y [place] modo de utilizarlos en funcin de su historia clnica. El mdico le dir qu efectos secundarios deber Best boy en cuenta si decide utilizar un medicamento o seguir  un tratamiento. Lea cuidadosamente la informacin en el envase. No use otro producto que contenga nicotina mientras use uno de reemplazo.  Umapine parte de las recadas se producen dentro de los 3 primeros meses de abandonar el hbito. No se desanime si comienza a fumar de nuevo. Recuerde, la [employer] tratan varias veces de dejar de fumar antes de lograrlo. Podr sufrir sndrome de abstinencia porque su cuerpo est acostumbrado a la nicotina. Podr sentir el deseo compulsivo de fumar, irritabilidad, enojo, tos, cefaleas y dificultad para concentrarse. Estos sntomas son transitorios. Son ms intensos en un comienzo, pero desaparecern en 10 a 14 das. Para reducir las probabilidades de fracaso:  Evite el consumo alcohol. El beber disminuye sus posibilidades de xito.  Disminuya el consumo de cafena. Una vez que deje de fumar, la cantidad de cafena en su organismo aumenta y puede darle sntomas, como frecuencia cardaca rpida, sudoracin y ansiedad.  Evite a las [employer] fuman porque pueden hacer que usted desee [place].  No deje que el aumento de peso distraiga su objetivo. Muchos fumadores aumentarn de peso cuando dejen de fumar, generalmente menos de 10 libras (4,5 kg). Consuma una dieta saludable y [place]. Siempre podr  perder el peso que gan despus de dejar de fumar.  Encuentre formas de mejorar su estado de nimo que no sean fumando. PARA OBTENER MS INFORMACIN  www.smokefree.gov  Document Released: 03/11/2005 Document Revised: 07/26/2013 [facility] Patient Information 2015 [place], [place]. This information is not intended to replace advice given to you by your health care provider. Make sure you discuss any questions you have with your health care provider. Diabetes y cuidados del pie (Diabetes and Foot Care) La diabetes puede ser la causa de que el flujo sanguneo (circulacin) en las piernas y los pies sea deficiente.  Debido a esto, la piel de los pies se torna ms delgada, se rompe con facilidad y se cura ms lentamente. La piel puede estar seca, despellejarse y Medical illustrator. Tambin pueden estar daados los nervios de las piernas y de los pies lo que provoca una disminucin de la sensibilidad. Es posible que no advierta heridas ms pequeas en los pies, que pueden causar infecciones graves. Cuidar sus pies es una de las cosas ms importantes que puede hacer por usted mismo.  INSTRUCCIONES PARA EL CUIDADO EN EL HOGAR  Use siempre calzado, an dentro de su casa. No camine descalzo. Caminar descalzo facilita que se lastime.  Controle sus pies diariamente para observar ampollas, cortes y enrojecimiento. Si no puede ver la planta del pie, use un espejo o pdale ayuda a Nurse, children's.  Lave sus pies con agua tibia (no use agua caliente) y un [place]. Seque bien sus pies, y la zona [employer] dedos dando [place], hasta que estn completamente secos. Noremoje los pies, ya que esto puede resecar la piel.  Aplique una locin hidratante o vaselina (que no contenga alcohol ni perfume) en los pies y en las uas secas y [place]. No aplique locin entre los dedos.  Recorte las uas en forma recta. No escarbe debajo de las uas o alrededor [employer]. Lime los bordes de las uas con una lima o esmeril.  No corte las durezas o callosidades, ni trate de quitarlas con medicamentos.  Use calcetines de algodn o medias [employer]. Asegrese de que no le [employer]. Nouse calcetines que le lleguen a las rodillas, ya que podran disminuir el flujo de sangre a las piernas.  Use zapatos de cuero que le queden bien y que sean acolchados. Para amoldar los zapatos, clcelos slo algunas horas por da. Esto evitar lesiones en los pies. Revise siempre los zapatos antes de ponerlos para asegurarse de que no haya objetos en su interior.  No cruce las piernas. Esto puede disminuir el flujo de sangre a los  pies.  Si algo le ha raspado, cortado o lastimado la piel de los pies, mantenga la piel de esa zona limpia y [place]. Debe higienizar estas zonas con agua y un jabn suave. No limpie la zona con agua oxigenada, alcohol ni yodo.  Cuando se quite un vendaje adhesivo, asegrese de no daar la piel.  Si tiene una herida, obsrvela varias veces por da para asegurarse de que se est curando.  No use bolsas de agua caliente ni almohadillas trmicas. Podran causar quemaduras. Si ha perdido la sensibilidad en los pies o las piernas, no sabr lo que le est sucediendo hasta que sea demasiado tarde.  Asegrese de que su mdico le haga un examen completo de los pies por lo menos una vez al ao, o con ms frecuencia si usted tiene [employer]. Informe todos los cortes, llagas o moretones a su mdico  inmediatamente. SOLICITE ATENCIN MDICA SI:   Tiene una lesin que no se cura.  Tiene cortes o rajaduras en la piel.  Tiene una ua encarnada.  Nota una zona irritada en las piernas o los pies.  Siente una sensacin de ardor u hormigueo en las piernas o los pies.  Siente dolor o calambres en las piernas o los pies.  Las piernas o los pies estn adormecidos.  Siente los pies siempre fros. SOLICITE ATENCIN MDICA DE INMEDIATO SI:   Presenta enrojecimiento, hinchazn o aumento del dolor en una herida.  Nota una lnea roja que sube por pierna.  Aparece pus en la herida.  Le sube la fiebre o segn lo que le indique el mdico.  Advierte un olor ftido que proviene de una lcera o una herida. Document Released: 03/11/2005 Document Revised: 11/11/2012 [facility] Patient Information 2015 [place]. This information is not intended to replace advice given to you by your health care provider. Make sure you discuss any questions you have with your health care provider.

## 2014-04-08 NOTE — Progress Notes (Signed)
Patient ID: Billy Gregory, male   DOB: 08/30/1980, 34 y.o.   MRN: [id number]  GUY:[id number]  [id number]  DOB - 1980-06-08  CC:  Chief Complaint  Patient presents with  . Establish Care  . Hospitalization Follow-up       HPI: Billy Gregory is a 34 y.o. male here today to establish medical care.  Patient was admitted in the hospital on 1/10 and was found to have new onset T2DM.  He has been checking his blood sugar once per day and did not bring a log with him.  Blood sugars 245, 220, 274. He denies any hypoglycemic events.  He reports resolution of blurred vision and neuropathy.  Patient has No headache, No chest pain, No abdominal pain - No Nausea, No new weakness tingling or numbness, No Cough - SOB.  No Known Allergies Past Medical History  Diagnosis Date  . Diabetes mellitus without complication    Current Outpatient Prescriptions on File Prior to Visit  Medication Sig Dispense Refill  . acetaminophen (TYLENOL) 325 MG tablet Take 650 mg by mouth every 6 (six) hours as needed.    . Blood Glucose Monitoring Suppl (RELION CONFIRM GLUCOSE MONITOR) W/DEVICE KIT 1 each by Does not apply route once. 1 kit 0  . glucose blood (RELION GLUCOSE TEST STRIPS) test strip Test your blood sugar daily in the morning or when feeling badly. 100 each 0  . insulin glargine (LANTUS) 100 UNIT/ML injection Inject 0.15 mLs (15 Units total) into the skin daily. 10 mL 0  . metFORMIN (GLUCOPHAGE) 500 MG tablet Take 1 tablet (500 mg total) by mouth 2 (two) times daily with a meal. 60 tablet 0   No current facility-administered medications on file prior to visit.   Family History  Problem Relation Age of Onset  . Diabetes Paternal Aunt    History   Social History  . Marital Status: Single    Spouse Name: N/A    Number of Children: N/A  . Years of Education: N/A   Occupational History  .      Housekeeping   Social History Main Topics  . Smoking status: Current Some Day Smoker   . Smokeless tobacco: Not on file  . Alcohol Use: Yes     Comment: occasional  . Drug Use: No  . Sexual Activity: Not on file   Other Topics Concern  . 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    Review of Systems: Constitutional: Negative for fever, chills, diaphoresis, activity change, appetite change and fatigue. HENT: Negative for ear pain, nosebleeds, congestion, facial swelling, rhinorrhea, neck pain, neck stiffness and ear discharge.  Eyes: Negative for pain, discharge, redness, itching and visual disturbance. Respiratory: Negative for cough, choking, chest tightness, shortness of breath, wheezing and stridor.  Cardiovascular: Negative for chest pain, palpitations and leg swelling. Gastrointestinal: Negative for abdominal distention. Genitourinary: Negative for dysuria, urgency, frequency, hematuria, flank pain, decreased urine volume, difficulty urinating and dyspareunia.  Musculoskeletal: Negative for back pain, joint swelling, arthralgia and gait problem. Neurological: Negative for dizziness, tremors, seizures, syncope, facial asymmetry, speech difficulty, weakness, light-headedness, numbness and headaches.  Hematological: Negative for adenopathy. Does not bruise/bleed easily. Psychiatric/Behavioral: Negative for hallucinations, behavioral problems, confusion, dysphoric mood, decreased concentration and agitation.    Objective:   Filed Vitals:   04/08/14 1056  BP: 147/92  Pulse: 89  Temp: 98.6 F (37 C)  Resp: 16    Physical Exam: Constitutional: Patient  appears well-developed and well-nourished. No distress. HENT: Normocephalic, atraumatic, External right and left ear normal. Oropharynx is clear and moist.  Eyes: Conjunctivae and EOM are normal. PERRLA, no scleral icterus. Neck: No thyromegaly. CVS: RRR, S1/S2 +, no murmurs, no gallops, no carotid bruit.  Pulmonary: Effort and breath sounds normal, no stridor, rhonchi,  wheezes, rales.  Abdominal: Soft. BS +, no distension, tenderness, rebound or guarding.  Musculoskeletal: Normal range of motion. No edema and no tenderness.  Lymphadenopathy: No lymphadenopathy noted, cervical Neuro: Alert. Skin: Skin is warm and dry. Not diaphoretic. No erythema. No pallor. Onychomycosis of several digits of bilateral extremities  Psychiatric: Normal mood and affect. Behavior, judgment, thought content normal.  Lab Results  Component Value Date   WBC 8.8 04/03/2014   HGB 14.2 04/03/2014   HCT 39.4 04/03/2014   MCV 77.6* 04/03/2014   PLT 181 04/03/2014   Lab Results  Component Value Date   CREATININE 0.74 04/05/2014   BUN 10 04/05/2014   NA 133* 04/05/2014   K 3.6 04/05/2014   CL 103 04/05/2014   CO2 24 04/05/2014    Lab Results  Component Value Date   HGBA1C 11.6* 04/03/2014   Lipid Panel     Component Value Date/Time   CHOL 144 04/04/2014 0501   TRIG 352* 04/04/2014 0501   HDL 20* 04/04/2014 0501   CHOLHDL 7.2 04/04/2014 0501   VLDL 70* 04/04/2014 0501   LDLCALC 54 04/04/2014 0501       Assessment and plan:   Kairyn was seen today for establish care and hospitalization follow-up.  Diagnoses and associated orders for this visit:  Type 2 diabetes mellitus without complication - Glucose (CBG) - insulin aspart (novoLOG) injection 10 Units; Inject 0.1 mLs (10 Units total) into the skin once. - insulin glargine (LANTUS) 100 UNIT/ML injection; Inject 0.15 mLs (15 Units total) into the skin daily. - metFORMIN (GLUCOPHAGE) 1000 MG tablet; Take 1 tablet (1,000 mg total) by mouth 2 (two) times daily with a meal. - Microalbumin, urine - Amb Referral to Nutrition and Diabetic E Increased Metformin to 1,000 mg BID due to continued elevated sugars. I will follow up with pt is 2 weeks for further medication management. - glucose blood (RELION GLUCOSE TEST STRIPS) test strip; Test your blood sugar daily in the morning or when feeling badly. - RELION  LANCETS MICRO-THIN 33G MISC; Check blood sugar 3 times per day  Onychomycosis Lamisil OTC for 6 weeks twice daily.   Smoking Smoking cessation discussed for 3 minutes, patient is not willing to quit at this time. Will continue to assess on each visit. Discussed increased risk for diseases such as cancer, heart disease, and stroke.  Patient reports that he only smokes on social occasions. Explained that he should refrain from smoking altogether.  Need for prophylactic vaccination and inoculation against influenza - Flu Vaccine QUAD 36+ mos IM   Return in about 2 weeks (around 04/22/2014) for Nurse Visit-cbg log and 3 mo PCP.      Chari Manning, NP-C [facility] [phone] 04/08/2014, 11:20 AM

## 2014-04-08 NOTE — Progress Notes (Signed)
[facility] Pt is here to establish care. Pt is here to continuing his treatment for his diabetes. Pt has no C.C. Today.

## 2014-04-09 LAB — MICROALBUMIN, URINE: MICROALB UR: 4.4 mg/dL — AB (ref <2.0)

## 2014-04-11 LAB — ANTI-ISLET CELL ANTIBODY: Pancreatic Islet Cell Antibody: <5 {JDF'U} (ref <5)

## 2014-04-12 ENCOUNTER — Other Ambulatory Visit: Payer: Self-pay | Admitting: Internal Medicine

## 2014-04-12 MED ORDER — "INSULIN SYRINGE 28G X 1/2"" 0.5 ML MISC": Status: DC

## 2014-04-14 ENCOUNTER — Telehealth: Payer: Self-pay | Admitting: *Deleted

## 2014-04-14 NOTE — Telephone Encounter (Signed)
-----   Message from Lance Bosch, NP sent at 04/12/2014 10:45 PM EST ----- Blood work reveals type 2 diabetes

## 2014-04-14 NOTE — Telephone Encounter (Signed)
Left voice message to return call (message left in Spanish)

## 2014-04-22 ENCOUNTER — Ambulatory Visit: Payer: Self-pay

## 2014-04-29 ENCOUNTER — Ambulatory Visit: Payer: MEDICAID | Attending: Internal Medicine | Admitting: *Deleted

## 2014-04-29 DIAGNOSIS — E119 Type 2 diabetes mellitus without complications: Secondary | ICD-10-CM

## 2014-04-29 NOTE — Patient Instructions (Addendum)
Hipoglucemia (Hypoglycemia) La hipoglucemia se produce cuando el nivel de glucosa en la sangre es demasiado bajo. La glucosa es un tipo de azcar, que es la principal fuente de energa del cuerpo. Hormonas, como la insulina y Secretary/administrator, Probation officer el nivel de glucosa en la [place]. La insulina reduce el nivel de la glucosa en la sangre, mientras que el glucagn lo [place]. Si tiene demasiada insulina en el torrente sanguneo o si no ingiere suficientes alimentos que contengan azcar, puede desarrollar hipoglucemia. Esta afeccin puede manifestarse en personas con o sin diabetes. Puede desarrollarse rpidamente y, como consecuencia, necesitar atencin urgente.  CAUSAS   Omitir o retrasar comidas.  No ingerir demasiados carbohidratos en las comidas.  Consumo excesivo de medicamentos para la diabetes.  No coordinar el horario de la toma de medicamentos por va oral para la diabetes o de insulina, con las comidas, las colaciones y Adult nurse.  Nuseas y vmitos.  Algunos medicamentos.  Enfermedades graves, como hepatitis, trastornos renales y ciertos trastornos de Youth worker.  Aumento de la actividad fsica o el ejercicio, sin ingerir alimentos adicionales o ajustar los medicamentos.  Beber alcohol en exceso.  Un trastorno nervioso que afecta las funciones corporales, como la frecuencia cardaca, presin arterial y digestin (neuropata [place]).  Una afeccin en la cual los msculos del estmago no funcionan apropiadamente (gastroparesia). Por consiguiente, los medicamentos y los alimentos no pueden absorberse Personal assistant.  Pocas veces un tumor de pncreas puede producir demasiada insulina. SNTOMAS   Hambre.  Sudoraciones (diaforesis).  Cambio en la Firefighter.  Temblores.  Dolor de [place].  Ansiedad.  Aturdimiento.  Irritabilidad.  Dificultad para concentrarse.  Sequedad en la boca.  Hormigueo o adormecimiento de las manos y los pies.  Sueo  agitado o alteraciones del sueo.  Alteracin en el habla y la coordinacin.  Cambio en el estado mental.  Convulsiones breves o prolongadas.  Agresividad  Somnolencia (letargo).  Debilidad.  Aumento de la frecuencia cardaca o palpitaciones.  Confusin.  Piel plida o de [employer].  Visin borrosa o doble.  Desmayos. DIAGNSTICO  Le harn un examen fsico y [place] historia clnica. Su mdico puede hacer un diagnstico en funcin de sus sntomas. Pueden realizarle anlisis de sangre y otras pruebas de laboratorio para Physicist, medical diagnstico. Una vez realizado el diagnstico, su mdico observar si los signos y sntomas desaparecen, una vez que aumenta el nivel de la glucosa en la [place].  TRATAMIENTO  Por lo general, la hipoglucemia puede tratarse fcilmente cuando se observan sntomas.  Controle su nivel de glucosa en la sangre. Si es menor que 70 mg/dl, tome uno de los siguientes:  3 o 4 comprimidos de glucosa.   taza de jugo.   taza de una gaseosa comn.  [place]   a 1 pomo de glucosa en gel.  5 a 6 caramelos duros.  Evite las bebidas o los alimentos con alto contenido de grasa, que pueden retrasar el aumento de los niveles de glucosa en la [place].  No ingiera ms de la cantidad recomendada de alimentos, bebidas, gel o comprimidos que contengan azcar. Si lo hace, el nivel de glucosa en la sangre subir demasiado.  Espere de 10 a 15 minutos y vuelva a Chief Technology Officer su nivel de glucosa en la sangre. Si an es Scientist, product/process development 70 mg/dl o est por debajo del intervalo indicado, repita el tratamiento.  Ingiera una colacin si falta ms de 1 hora para su prxima comida. Es posible que, [place],  su nivel de glucosa en la sangre baje demasiado, de modo que no pueda tratarse en su casa, cuando comience a observar los sntomas. Probablemente necesite ayuda. Incluso puede desmayarse o ser incapaz de tragar. Si no puede tratarse por s solo, alguien Manufacturing engineer al hospital.  INSTRUCCIONES PARA EL CUIDADO EN EL HOGAR  Si tiene diabetes, siga su plan de control de la diabetes:  Tome los medicamentos segn las indicaciones.  Siga el plan de ejercicio.  Siga el plan de comidas. No saltee comidas. Coma a horario.  Controle su nivel de glucosa en la sangre peridicamente. Controle su nivel de glucosa en la sangre antes y despus de ejercitarse. Si hace ejercicio durante ms tiempo o de [employer] de lo habitual, asegrese de Chief Technology Officer su nivel de glucosa en la sangre con mayor frecuencia.  Use su pulsera o medalla de alerta mdica, que indica que usted tiene diabetes.  Identifique la causa de su hipoglucemia. Luego, desarrolle formas de prevenir la recurrencia de la hipoglucemia.  No tome un bao o una ducha caliente inmediatamente despus de una inyeccin de insulina.  Siempre lleve [facility]. Las pastillas de glucosa son fciles de Catering manager.  Si va a beber alcohol, bbalo solo con las comidas.  Informe a familiares y amigos qu pueden hacer para mantenerlo seguro durante una convulsin. Esto puede incluir retirar [employer] duros o filosos del rea o colocarlo de costado.  Mantenga un peso saludable. SOLICITE ATENCIN MDICA SI:   Tiene problemas para Advertising account executive de glucosa en la sangre dentro del intervalo indicado.  Tiene episodios frecuentes de hipoglucemia.  Siente efectos secundarios por los medicamentos prescritos.  No est seguro por qu su nivel de glucosa en la sangre es tan bajo.  Nota cambios o un nuevo problema en la visin . SOLICITE ATENCIN MDICA DE INMEDIATO SI:   Presenta confusin.  Se produce un cambio en su estado mental.  Es incapaz de tragar.  Se desmaya. Document Released: 03/11/2005 Document Revised: 03/16/2013 [facility] Patient Information 2015 [place]. This information is not intended to replace advice given to you by your health care provider. Make sure you discuss  any questions you have with your health care provider. Plan de alimentacin DASH (DASH Eating Plan) DASH es la sigla en ingls de "Enfoques Alimentarios para Detener la Hipertensin". El plan de alimentacin DASH ha demostrado bajar la presin arterial elevada (hipertensin). Los beneficios adicionales para la salud pueden incluir la disminucin del riesgo de diabetes mellitus tipo2, enfermedades cardacas e ictus. Este plan tambin puede ayudar a Horticulturist, commercial. QU DEBO SABER ACERCA DEL PLAN DE ALIMENTACIN DASH? Para el plan de alimentacin DASH, seguir las siguientes pautas generales:  Elija los alimentos con un valor porcentual diario de sodio de menos del 5% (segn figura en la etiqueta del alimento).  Use hierbas o aderezos sin sal, en lugar de sal de mesa o sal marina.  Consulte al mdico o farmacutico antes de usar sustitutos de la sal.  Coma productos con bajo contenido de sodio, cuya etiqueta suele decir "bajo contenido de sodio" o "sin agregado de sal".  Coma alimentos frescos.  Coma ms verduras, frutas y productos lcteos con bajo contenido de [place].  Elija los cereales integrales. Busque la palabra "integral" en Equities trader de la lista de ingredientes.  Elija el pescado y el pollo o el pavo sin piel ms a menudo que las carnes rojas. Limite el consumo de pescado, carne de ave y carne a 6onzas (170g) por  da.  Limite el consumo de [place], postres, azcares y bebidas azucaradas.  Elija las grasas saludables para el corazn.  Limite el consumo de queso a 1onza (28g) por Training and development officer.  Consuma ms comida casera y menos de restaurante, de buf y comida rpida.  Limite el consumo de alimentos fritos.  Cocine los alimentos utilizando mtodos que no sean la fritura.  Limite las verduras enlatadas. Si las consume, enjuguelas bien para disminuir el sodio.  Cuando coma en un restaurante, pida que preparen su comida con menos sal o, en lo posible, sin nada de sal. QU  ALIMENTOS PUEDO COMER? Pida ayuda a un nutricionista para conocer las necesidades calricas individuales. Cereales Pan de salvado o integral. Arroz integral. Pastas de salvado o integrales. Quinua, trigo burgol y cereales integrales. Cereales con bajo contenido de sodio. Tortillas de harina de maz o de salvado. Pan de maz integral. Galletas saladas integrales. Galletas con bajo contenido de [place]. Vegetales Verduras frescas o congeladas (crudas, al vapor, asadas o grilladas). Jugos de tomate y verduras con contenido bajo o reducido de sodio. Pasta y salsa de tomate con contenido bajo o [place]. Verduras enlatadas con bajo contenido de sodio o reducido de sodio.  Lambert Mody Lambert Mody frescas, en conserva (en su jugo natural) o frutas congeladas. Carnes y otros productos con protenas Carne de res molida (al 85% o ms [place]), carne de res de animales alimentados con pastos o carne de res sin la grasa. Pollo o pavo sin piel. Carne de pollo o de [place]. Cerdo sin la grasa. Todos los pescados y frutos de mar. Huevos. Porotos, guisantes o lentejas secos. Frutos secos y semillas sin sal. Frijoles enlatados sin sal. Lcteos Productos lcteos con bajo contenido de grasas, como [place] o al 1%, quesos reducidos en grasas o al 2%, ricota con bajo contenido de grasas o [employer], o yogur natural con bajo contenido de [place]. Quesos con contenido bajo o reducido de sodio. Grasas y Naval architect en barra que no contengan grasas trans. [place] y alios para ensaladas livianos o reducidos en grasas (reducidos en sodio). [place]. Aceites de crtamo, oliva o canola. [place] natural de man o almendra. Otros Palomitas de maz y pretzels sin sal. Los artculos mencionados arriba pueden no ser [employer] Foods Company de las bebidas o los alimentos recomendados. Comunquese con el nutricionista para conocer ms opciones. QU ALIMENTOS NO SE RECOMIENDAN? Cereales Pan blanco. Pastas  blancas. Arroz blanco. Pan de maz refinado. Bagels y croissants. Galletas saladas que contengan grasas trans. Vegetales Vegetales con crema o fritos. Verduras en [place]. Verduras enlatadas comunes. Pasta y salsa de tomate en lata comunes. Jugos comunes de tomate y de verduras. Lambert Mody Frutas secas. Fruta enlatada en almbar liviano o espeso. Jugo de frutas. Carnes y otros productos con protenas [place] de carne con Lobbyist. Costillas, alas de pollo, tocineta, salchicha, mortadela, salame, chinchulines, tocino, perros calientes, salchichas alemanas y embutidos envasados. Frutos secos y semillas con sal. Frijoles con sal en lata. Lcteos [place] entera o al 2%, crema, mezcla de [place] y crema, y queso crema. Yogur entero o endulzado. Quesos o queso azul con alto contenido de Physicist, medical. [place]remas no lcteas y coberturas batidas. Quesos procesados, quesos para untar o cuajadas. Condimentos Sal de cebolla y ajo, sal condimentada, sal de mesa y sal marina. Salsas en lata y envasadas. Salsa [place]. Salsa trtara. Salsa barbacoa. Salsa teriyaki. Salsa de soja, incluso la que tiene contenido reducido de [place]. Salsa de carne. Salsa de pescado. Salsa de [place].  Salsa rosada. Rbano picante. Ketchup y mostaza. Saborizantes y tiernizantes para carne. Caldo en cubitos. Salsa picante. Salsa tabasco. Adobos. Aderezos para tacos. Salsas. Grasas y aceites [place], [place] en barra, [place] de [place], [place], [place] clarificada y Wendee Copp de tocino. Aceites de coco, de palmiste o de palma. Aderezos comunes para ensalada. Otros Pickles y [place]. Palomitas de maz y pretzels con sal. Los artculos mencionados arriba pueden no ser Dean Foods Company de las bebidas y los alimentos que se Higher education careers adviser. Comunquese con el nutricionista para obtener ms informacin. DNDE Dolan Amen MS INFORMACIN? [place], del Pulmn y de la Sangre (National Heart, Lung, and [place]):  [email or web address] Document Released: 02/28/2011 Document Revised: 07/26/2013 [facility] Patient Information 2015 [place], [place]. This information is not intended to replace advice given to you by your health care provider. Make sure you discuss any questions you have with your health care provider.

## 2014-04-29 NOTE — Progress Notes (Signed)
Spoke with patient via [employer], [place], [place] Patient presents for CBG and record review Med list reviewed; patient reports taking all meds as directed Over last 15 days Patient's AM fasting blood sugars ranging 82-117  Before lunch blood sugars ranging 67-123 States he had blurred vision when BS 67 but no other symptoms. Ate lunch right away and vision improved. Discussed s/sx of hypoglycemia and need to raise BS quickly by drinking, OJ, regular soda, or chewing hard candies. Before dinner blood sugars ranging 87-152 Discussed need for low sodium diet and using Mrs. Dash as alternative to salt Discussed walking 30 minutes/day for exercise  Fasting CBG this AM 81  BP 134/78 P 85 R 16 T  98.2 oral SpO2 98%  Patient advised to call for med refills at least 7 days before running out so as not to go without. Patient aware that he is to f/u with PCP 3 months from last visit. Due 07/08/2014  Patient given literature on DASH Eating Plan and hypoglycemia

## 2014-05-02 ENCOUNTER — Encounter: Payer: Self-pay | Admitting: Dietician

## 2014-05-02 ENCOUNTER — Telehealth: Payer: Self-pay | Admitting: *Deleted

## 2014-05-02 ENCOUNTER — Encounter: Payer: Self-pay | Attending: Internal Medicine | Admitting: Dietician

## 2014-05-02 VITALS — Ht 64.0 in | Wt 167.0 lb

## 2014-05-02 DIAGNOSIS — Z794 Long term (current) use of insulin: Secondary | ICD-10-CM | POA: Insufficient documentation

## 2014-05-02 DIAGNOSIS — Z713 Dietary counseling and surveillance: Secondary | ICD-10-CM | POA: Insufficient documentation

## 2014-05-02 DIAGNOSIS — E119 Type 2 diabetes mellitus without complications: Secondary | ICD-10-CM

## 2014-05-02 MED ORDER — INSULIN GLARGINE 100 UNIT/ML ~~LOC~~ SOLN: 12.0000 [IU] | Freq: Every day | SUBCUTANEOUS | Status: DC

## 2014-05-02 NOTE — Patient Instructions (Signed)
Plan:  Aim for 4 Carb Choices per meal (60 grams) +/- 1 either way  Aim for 0-2 Carbs per snack if hungry  Include protein in moderation with your meals and snacks Consider reading food labels for Total Carbohydrate and Fat Grams of foods Exercise at least 30 minutes every day.  Walking would be fine. Consider checking BG at alternate times per day as directed by MD  Take medication as directed by MD

## 2014-05-02 NOTE — Telephone Encounter (Signed)
Spoke with patient via [employer], [place], [place]  Patient instructed to decrease lantus to 12 units q HS Per PCP Continue other meds as is F/u with PCP 3 months from last OV (due 07/08/14)

## 2014-05-02 NOTE — Progress Notes (Signed)
Diabetes Self-Management Education  Visit Type:    Appt. Start Time: 0830 Appt. End Time: 1000  05/02/2014  [name], DOB 1980/08/22, is a 34 y.o. male with a diagnosis of Diabetes: Type 2.  Other people present during visit:  Patient, Spouse/SO, Interpreter (Interpretor [employer]) and infant son.  Patient states that he smokes occasionally but has reduced smoking significantly.  ASSESSMENT  Height 5\' 4"  (1.626 m), weight 167 lb (75.751 kg). Body mass index is 28.65 kg/(m^2).  Initial Visit Information:  Are you currently following a meal plan?: Yes (stopped drinking juice and soda and decreased food intake.  Was eating 18 tortillas at each meal)   Are you taking your medications as prescribed?: Yes Are you checking your feet?: Yes How many days per week are you checking your feet?: 7 How often do you need to have someone help you when you read instructions, pamphlets, or other written materials from your doctor or pharmacy?: 1 - Never What is the last grade level you completed in school?: 9th grade in [place]  Psychosocial:     Patient Belief/Attitude about Diabetes: Afraid Self-care barriers: English as a second language (Spanish speaking) Self-management support: Doctor's office, Family Other persons present: Patient, Spouse/SO, Astronomer (Environmental manager) Patient Concerns: Nutrition/Meal planning Special Needs: Verbal instruction, Other (comment) Preferred Learning Style: Visual Learning Readiness: Ready  Complications:   Last HgB A1C per patient/outside source: 11.6 mg/dL How often do you check your blood sugar?: 3-4 times/day Fasting Blood glucose range (mg/dL): 70-129 Postprandial Blood glucose range (mg/dL): 70-129, 130-179 Number of hypoglycemic episodes per month: 1 Can you tell when your blood sugar is low?: Yes What do you do if your blood sugar is low?: Drink juice Number of hyperglycemic episodes per week: 0 Have you  had a dilated eye exam in the past 12 months?: Yes Have you had a dental exam in the past 12 months?: Yes  Diet Intake:  Breakfast: 1 cup beans, eggs and coffee with milk and sugar sub Snack (morning): fresh fruit Lunch: chicken, veges, salad, rice or tortillas Snack (afternoon): fresh fruit or raw veges Dinner: chicken, veges, salad, rice or tortillas (8 pm) Beverage(s): crystal light, coffee with milk and sugar sub, water  Exercise:  Exercise: Light (walking / raking leaves) (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  Patient education plan per assessed needs and concerns is to attend individual sessions for     Education Topics Reviewed with Patient Today:  Definition of diabetes, type 1 and 2, and the diagnosis of diabetes, Factors that contribute to the development of diabetes Role of diet in the treatment of diabetes and the relationship between the three main macronutrients and blood glucose level, Food label reading, portion sizes and measuring food., Meal timing in regards to the patients' current diabetes medication., Information on hints to eating out and maintain blood glucose control. Role of exercise on diabetes management, blood pressure control and cardiac health.   Interpreting lab values - A1C, lipid, urine microalbumina., Daily foot exams, Yearly dilated eye exam   Relationship between chronic complications and blood glucose control, Assessed and discussed foot care and prevention of foot problems, Dental care     Review risk of smoking and offered smoking cessation  PATIENTS GOALS/Plan (Developed by the patient):  Nutrition: Follow meal plan discussed Physical Activity: Exercise 3-5 times per week, 30 minutes per day  Medications: take my medication as prescribed Reducing Risk: examine blood glucose patterns,  stop smoking, do foot checks daily, treat hypoglycemia with 15 grams of carbs if blood glucose less than 70mg /dL Health Coping: ask for help with (comment) (depression-card for therapeutic alternatives, inc provided.)  Plan:   Patient Instructions  Plan:  Aim for 4 Carb Choices per meal (60 grams) +/- 1 either way  Aim for 0-2 Carbs per snack if hungry  Include protein in moderation with your meals and snacks Consider reading food labels for Total Carbohydrate and Fat Grams of foods Exercise at least 30 minutes every day.  Walking would be fine. Consider checking BG at alternate times per day as directed by MD  Take medication as directed by MD    Quit smoking  Expected Outcomes:   good compliance expected  Education material provided: Living Well with Diabetes, Food label handouts, Meal plan card and My Plate  If problems or questions, patient to contact team via:  Phone and Email  Future DSME appointment:  3 weeks

## 2014-05-05 ENCOUNTER — Ambulatory Visit: Payer: Self-pay | Attending: Internal Medicine

## 2014-05-24 ENCOUNTER — Encounter: Payer: Self-pay | Admitting: Dietician

## 2014-05-24 ENCOUNTER — Encounter: Payer: Self-pay | Attending: Internal Medicine | Admitting: Dietician

## 2014-05-24 VITALS — Ht 65.0 in | Wt 166.0 lb

## 2014-05-24 DIAGNOSIS — Z794 Long term (current) use of insulin: Secondary | ICD-10-CM | POA: Insufficient documentation

## 2014-05-24 DIAGNOSIS — Z713 Dietary counseling and surveillance: Secondary | ICD-10-CM | POA: Insufficient documentation

## 2014-05-24 DIAGNOSIS — E119 Type 2 diabetes mellitus without complications: Secondary | ICD-10-CM | POA: Insufficient documentation

## 2014-05-24 NOTE — Progress Notes (Signed)
Diabetes Self-Management Education Follow-up  Appt. Start Time:  10:30  Appt End Time:  11:00  05/24/14  [name], DOB 08/29/1980 diagnosed with Type 2 Diabetes last month with HgbA1C of 11.6% 2/16.  He is here today with his significant other, baby daughter, and interpretor Billy Gregory from [place].  He is not working.  Patient states that he no longer smokes.  Ht:  5'4" Weight 166 lbs Weight is unchanged  Patient is taking his medications as prescribed which includes 12 units Lantus at 9pm each night and Metformin 500 mg bid.  He has his blood sugar records and has been checking his CBG's bid.  AM fasting is 81-100. Before dinner is 69-106.  He has had a CBG of 69 about once each week and states that he was not symptomatic although possibly grumpy. States that he checks his blood sugar at other times whenever he does not feel well.  Patient has been drinking crystal light, coffee with milk and sugar sub, and water.  States when he drinks a small amount of soda with an occasional low blood sugar and no longer likes it.  He has decreased his intake from 18 tortillas at each meal prior to diagnosis to 4-5 since being diagnosed.  This am he had a fried [employer] sandwich.  He states that this is unusual.  Intervention:   Reviewed signs and symptoms of hypoglycemia and treatment.  Reviewed label reading and portions of carbohydrates.  Discussed HgbA1C and goals.  Reviewed normal blood sugar range when testing and patient was able to verbalize.   Plan:  Continue the great changes that you have made! Choose foods that are baked more often than fried. Choose 4 carbohydrate choices at each meal (60 grams per meal) Continue to walk at least 30 minutes per day.  Handouts: Meal plan card in [place] folder on further carb counting. Food label handout HgbA1C handout  Patient to return for follow up in 2-3 months.    Billy Gregory, RD, LDN

## 2014-05-24 NOTE — Patient Instructions (Signed)
Continue the great changes that you have made! Choose foods that are baked more often than fried. Choose 4 carbohydrate choices at each meal (60 grams per meal) Continue to walk at least 30 minutes per day.

## 2014-06-07 ENCOUNTER — Telehealth: Payer: Self-pay | Admitting: *Deleted

## 2014-06-07 ENCOUNTER — Ambulatory Visit: Payer: Self-pay | Attending: Internal Medicine | Admitting: *Deleted

## 2014-06-07 VITALS — BP 131/79 | HR 67 | Temp 98.3°F | Resp 18 | Wt 167.0 lb

## 2014-06-07 DIAGNOSIS — E1165 Type 2 diabetes mellitus with hyperglycemia: Secondary | ICD-10-CM

## 2014-06-07 DIAGNOSIS — E11649 Type 2 diabetes mellitus with hypoglycemia without coma: Secondary | ICD-10-CM | POA: Insufficient documentation

## 2014-06-07 DIAGNOSIS — E119 Type 2 diabetes mellitus without complications: Secondary | ICD-10-CM

## 2014-06-07 LAB — GLUCOSE, POCT (MANUAL RESULT ENTRY): POC Glucose: 100 mg/dl — AB (ref 70–99)

## 2014-06-07 MED ORDER — INSULIN GLARGINE 100 UNIT/ML ~~LOC~~ SOLN: 9.0000 [IU] | Freq: Every day | SUBCUTANEOUS | Status: DC

## 2014-06-07 MED ORDER — LISINOPRIL 5 MG PO TABS: 5.0000 mg | ORAL_TABLET | Freq: Every day | ORAL | Status: DC

## 2014-06-07 NOTE — Progress Notes (Signed)
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 Ate a pear and candy. By 5300 started having blurred vision. Checked BS = 63 Drank regular soda and felt better Patient taking 12 units lantus daily at 2130 and metformin 1000 mg bid with a meal This AM's fasting BS = 95 States feels okay today Per log AM fasting blood sugars ranging 75-98 Before dinner blood sugars ranging 72-117 States drinking 5-6 glasses of water throughout day  In-house CBG 100 4 hours after eating beans and 2 tortillas with cheese  BP 131/79 P 67 R 18 T 98.3 oral SpO2 98% WT 167lb  Patient given literature on hypoglycemia  Per PCP: Decrease lantus to 9 units qHS Add lisinopril 5 mg daily Return in 4 weeks for f/u with PCP

## 2014-06-07 NOTE — Telephone Encounter (Addendum)
Left VM message via [address] [place], [place] for patient to return call  Per PCP: Decrease lantus to 9 units qHS Add lisinopril 5 mg daily Return in 4 weeks for f/u with PCP  Lisinopril rx e-scribed to [facility]

## 2014-06-07 NOTE — Telephone Encounter (Signed)
Patient notified via in-house interpreter, Laurene Footman to decrease lantus to 9 units qHS Start lisinopril 5 mg daily And schedule f/u appt with PCP (Due 07/08/14) Patient states understanding

## 2014-06-07 NOTE — Patient Instructions (Signed)
Hipoglucemia (Hypoglycemia) La hipoglucemia se produce cuando el nivel de glucosa en la sangre es demasiado bajo. La glucosa es un tipo de azcar, que es la principal fuente de energa del cuerpo. Hormonas, como la insulina y Secretary/administrator, Probation officer el nivel de glucosa en la [place]. La insulina reduce el nivel de la glucosa en la sangre, mientras que el glucagn lo [place]. Si tiene demasiada insulina en el torrente sanguneo o si no ingiere suficientes alimentos que contengan azcar, puede desarrollar hipoglucemia. Esta afeccin puede manifestarse en personas con o sin diabetes. Puede desarrollarse rpidamente y, como consecuencia, necesitar atencin urgente.  CAUSAS   Omitir o retrasar comidas.  No ingerir demasiados carbohidratos en las comidas.  Consumo excesivo de medicamentos para la diabetes.  No coordinar el horario de la toma de medicamentos por va oral para la diabetes o de insulina, con las comidas, las colaciones y Adult nurse.  Nuseas y vmitos.  Algunos medicamentos.  Enfermedades graves, como hepatitis, trastornos renales y ciertos trastornos de Youth worker.  Aumento de la actividad fsica o el ejercicio, sin ingerir alimentos adicionales o ajustar los medicamentos.  Beber alcohol en exceso.  Un trastorno nervioso que afecta las funciones corporales, como la frecuencia cardaca, presin arterial y digestin (neuropata [place]).  Una afeccin en la cual los msculos del estmago no funcionan apropiadamente (gastroparesia). Por consiguiente, los medicamentos y los alimentos no pueden absorberse Personal assistant.  Pocas veces un tumor de pncreas puede producir demasiada insulina. SNTOMAS   Hambre.  Sudoraciones (diaforesis).  Cambio en la Firefighter.  Temblores.  Dolor de [place].  Ansiedad.  Aturdimiento.  Irritabilidad.  Dificultad para concentrarse.  Sequedad en la boca.  Hormigueo o adormecimiento de las manos y los pies.  Sueo  agitado o alteraciones del sueo.  Alteracin en el habla y la coordinacin.  Cambio en el estado mental.  Convulsiones breves o prolongadas.  Agresividad  Somnolencia (letargo).  Debilidad.  Aumento de la frecuencia cardaca o palpitaciones.  Confusin.  Piel plida o de [employer].  Visin borrosa o doble.  Desmayos. DIAGNSTICO  Le harn un examen fsico y [place] historia clnica. Su mdico puede hacer un diagnstico en funcin de sus sntomas. Pueden realizarle anlisis de sangre y otras pruebas de laboratorio para Physicist, medical diagnstico. Una vez realizado el diagnstico, su mdico observar si los signos y sntomas desaparecen, una vez que aumenta el nivel de la glucosa en la [place].  TRATAMIENTO  Por lo general, la hipoglucemia puede tratarse fcilmente cuando se observan sntomas.  Controle su nivel de glucosa en la sangre. Si es menor que 70 mg/dl, tome uno de los siguientes:  3 o 4 comprimidos de glucosa.   taza de jugo.   taza de una gaseosa comn.  [place]   a 1 pomo de glucosa en gel.  5 a 6 caramelos duros.  Evite las bebidas o los alimentos con alto contenido de grasa, que pueden retrasar el aumento de los niveles de glucosa en la [place].  No ingiera ms de la cantidad recomendada de alimentos, bebidas, gel o comprimidos que contengan azcar. Si lo hace, el nivel de glucosa en la sangre subir demasiado.  Espere de 10 a 15 minutos y vuelva a Chief Technology Officer su nivel de glucosa en la sangre. Si an es Scientist, product/process development 70 mg/dl o est por debajo del intervalo indicado, repita el tratamiento.  Ingiera una colacin si falta ms de 1 hora para su prxima comida. Es posible que, [place], [place]  nivel de glucosa en la sangre baje demasiado, de modo que no pueda tratarse en su casa, cuando comience a observar los sntomas. Probablemente necesite ayuda. Incluso puede desmayarse o ser incapaz de tragar. Si no puede tratarse por s solo, alguien Manufacturing engineer al hospital.  INSTRUCCIONES PARA EL CUIDADO EN EL HOGAR  Si tiene diabetes, siga su plan de control de la diabetes:  Tome los medicamentos segn las indicaciones.  Siga el plan de ejercicio.  Siga el plan de comidas. No saltee comidas. Coma a horario.  Controle su nivel de glucosa en la sangre peridicamente. Controle su nivel de glucosa en la sangre antes y despus de ejercitarse. Si hace ejercicio durante ms tiempo o de [employer] de lo habitual, asegrese de Chief Technology Officer su nivel de glucosa en la sangre con mayor frecuencia.  Use su pulsera o medalla de alerta mdica, que indica que usted tiene diabetes.  Identifique la causa de su hipoglucemia. Luego, desarrolle formas de prevenir la recurrencia de la hipoglucemia.  No tome un bao o una ducha caliente inmediatamente despus de una inyeccin de insulina.  Siempre lleve [facility]. Las pastillas de glucosa son fciles de Catering manager.  Si va a beber alcohol, bbalo solo con las comidas.  Informe a familiares y amigos qu pueden hacer para mantenerlo seguro durante una convulsin. Esto puede incluir retirar [employer] duros o filosos del rea o colocarlo de costado.  Mantenga un peso saludable. SOLICITE ATENCIN MDICA SI:   Tiene problemas para Advertising account executive de glucosa en la sangre dentro del intervalo indicado.  Tiene episodios frecuentes de hipoglucemia.  Siente efectos secundarios por los medicamentos prescritos.  No est seguro por qu su nivel de glucosa en la sangre es tan bajo.  Nota cambios o un nuevo problema en la visin . SOLICITE ATENCIN MDICA DE INMEDIATO SI:   Presenta confusin.  Se produce un cambio en su estado mental.  Es incapaz de tragar.  Se desmaya. Document Released: 03/11/2005 Document Revised: 03/16/2013 [facility] Patient Information 2015 [place]. This information is not intended to replace advice given to you by your health care provider. Make sure you discuss  any questions you have with your health care provider.

## 2014-07-04 ENCOUNTER — Ambulatory Visit: Payer: Medicaid Other | Admitting: Internal Medicine

## 2014-07-12 NOTE — Telephone Encounter (Signed)
Completed.

## 2014-07-13 ENCOUNTER — Ambulatory Visit: Payer: Medicaid Other | Attending: Internal Medicine | Admitting: Internal Medicine

## 2014-07-13 ENCOUNTER — Encounter: Payer: Self-pay | Admitting: Internal Medicine

## 2014-07-13 VITALS — BP 120/77 | HR 70 | Temp 98.4°F | Resp 16 | Ht 64.0 in | Wt 160.0 lb

## 2014-07-13 DIAGNOSIS — E119 Type 2 diabetes mellitus without complications: Secondary | ICD-10-CM | POA: Diagnosis not present

## 2014-07-13 DIAGNOSIS — B351 Tinea unguium: Secondary | ICD-10-CM | POA: Insufficient documentation

## 2014-07-13 DIAGNOSIS — Z794 Long term (current) use of insulin: Secondary | ICD-10-CM | POA: Insufficient documentation

## 2014-07-13 LAB — POCT GLYCOSYLATED HEMOGLOBIN (HGB A1C): Hemoglobin A1C: 5.9

## 2014-07-13 LAB — GLUCOSE, POCT (MANUAL RESULT ENTRY): POC Glucose: 110 mg/dl — AB (ref 70–99)

## 2014-07-13 MED ORDER — METFORMIN HCL 1000 MG PO TABS: 1000.0000 mg | ORAL_TABLET | Freq: Two times a day (BID) | ORAL | Status: DC

## 2014-07-13 MED ORDER — LISINOPRIL 5 MG PO TABS: 5.0000 mg | ORAL_TABLET | Freq: Every day | ORAL | Status: DC

## 2014-07-13 NOTE — Patient Instructions (Signed)
Diabetic socks  Lamisil cream for foot fungus

## 2014-07-13 NOTE — [place] Notes (Signed)
Patient ID: Billy Gregory, male   DOB: 06/16/1980, 34 y.o.   MRN: [id number] SUBJECTIVE: 34 y.o. male for follow up of diabetes. Diabetic Review of Systems - medication compliance: compliant all of the time, diabetic diet compliance: compliant most of the time, home glucose monitoring: is performed regularly, further diabetic ROS: no polyuria or polydipsia, no chest pain, dyspnea or TIA's, no numbness, tingling or pain in extremities, no unusual visual symptoms.  Other symptoms and concerns: He will call to see who is taking new patients so that he may have opthalmology referral.  He reports that his sugars drop often at work and has to eat more to compensate.   Current Outpatient Prescriptions  Medication Sig Dispense Refill  . acetaminophen (TYLENOL) 325 MG tablet Take 650 mg by mouth every 6 (six) hours as needed.    . Blood Glucose Monitoring Suppl (RELION CONFIRM GLUCOSE MONITOR) W/DEVICE KIT 1 each by Does not apply route once. 1 kit 0  . glucose blood (RELION GLUCOSE TEST STRIPS) test strip Test your blood sugar daily in the morning or when feeling badly. 100 each 3  . insulin glargine (LANTUS) 100 UNIT/ML injection Inject 0.09 mLs (9 Units total) into the skin daily. 10 mL 3  . INSULIN SYRINGE .5CC/28G 28G X 1/2" 0.5 ML MISC Give insulin QHS for E11.9 100 each 2  . lisinopril (PRINIVIL,ZESTRIL) 5 MG tablet Take 1 tablet (5 mg total) by mouth daily. 30 tablet 1  . metFORMIN (GLUCOPHAGE) 1000 MG tablet Take 1 tablet (1,000 mg total) by mouth 2 (two) times daily with a meal. 60 tablet 3  . RELION LANCETS MICRO-THIN 33G MISC Check blood sugar 3 times per day 100 each 3   No current facility-administered medications for this visit.    OBJECTIVE: Appearance: alert, well appearing, and in no distress, oriented to person, place, and time and normal appearing weight. BP 120/77 mmHg  Pulse 70  Temp(Src) 98.4 F (36.9 C) (Oral)  Resp 16  Ht 5' 4"  (1.626 m)  Wt 160 lb (72.576 kg)  BMI  27.45 kg/m2  SpO2 97%  Exam: heart sounds normal rate, regular rhythm, normal S1, S2, no murmurs, rubs, clicks or gallops, normal bilateral carotid upstroke without bruits, no JVD, no carotid bruits, feet: warm, good capillary refill, dry cracking heels, normal DP and PT pulses, normal monofilament exam and onychomycosis  ASSESSMENT: Diabetes Mellitus: well controlled  PLAN: See orders for this visit as documented in the electronic medical record. Issues reviewed with him: Stop taking lantus due to great glycemic control. Will maintain only on Metformin. Will recheck in 3 month. Will send to podiatry for foot care   Due to language barrier, an interpreter was present during the history-taking and subsequent discussion (and for part of the physical exam) with this patient.  Return in about 3 months (around 10/12/2014) for Diabetes Mellitus.  Chari Manning, NP 07/14/2014 10:45 PM

## 2014-07-13 NOTE — [place] Notes (Signed)
Pt is here following up on his Diabetes and HTN. Pt states that his sugars drop during work and he has to eat all the time so he doesn't crash. Interpreter [phone]

## 2014-08-01 ENCOUNTER — Other Ambulatory Visit: Payer: Self-pay | Admitting: Internal Medicine

## 2014-08-01 ENCOUNTER — Telehealth: Payer: Self-pay | Admitting: Internal Medicine

## 2014-08-01 ENCOUNTER — Telehealth: Payer: Self-pay | Admitting: *Deleted

## 2014-08-01 DIAGNOSIS — E119 Type 2 diabetes mellitus without complications: Secondary | ICD-10-CM

## 2014-08-01 MED ORDER — RELION LANCETS MICRO-THIN 33G MISC: Status: DC

## 2014-08-01 NOTE — Telephone Encounter (Signed)
Order has been placed. Please inform patient

## 2014-08-01 NOTE — Telephone Encounter (Signed)
Patient came into facility to request a med refill for his lancets to check his blood sugar. Please f/u with pt.

## 2014-08-01 NOTE — Telephone Encounter (Signed)
Left VM for pt to return my call  

## 2014-08-02 ENCOUNTER — Telehealth: Payer: Self-pay | Admitting: Internal Medicine

## 2014-08-02 NOTE — Telephone Encounter (Signed)
Patient is returning phone call from [place], please f/u with pt.

## 2014-08-03 ENCOUNTER — Encounter: Payer: Self-pay | Admitting: Podiatry

## 2014-08-03 ENCOUNTER — Ambulatory Visit (INDEPENDENT_AMBULATORY_CARE_PROVIDER_SITE_OTHER): Payer: Medicaid Other | Admitting: Podiatry

## 2014-08-03 VITALS — BP 133/82 | HR 89 | Temp 98.4°F | Resp 12

## 2014-08-03 DIAGNOSIS — B351 Tinea unguium: Secondary | ICD-10-CM | POA: Diagnosis not present

## 2014-08-03 DIAGNOSIS — E119 Type 2 diabetes mellitus without complications: Secondary | ICD-10-CM

## 2014-08-03 NOTE — Telephone Encounter (Signed)
Pt is aware about his supply's at his pharmacy.

## 2014-08-03 NOTE — Patient Instructions (Signed)
Diabetes y cuidados del pie (Diabetes and Foot Care) La diabetes puede ser la causa de que el flujo sanguneo (circulacin) en las piernas y los pies sea deficiente. Debido a esto, la piel de los pies se torna ms delgada, se rompe con facilidad y se cura ms lentamente. La piel puede estar seca, despellejarse y agrietarse. Tambin pueden estar daados los nervios de las piernas y de los pies lo que provoca una disminucin de la sensibilidad. Es posible que no advierta heridas ms pequeas en los pies, que pueden causar infecciones graves. Cuidar sus pies es una de las cosas ms importantes que puede hacer por usted mismo.  INSTRUCCIONES PARA EL CUIDADO EN EL HOGAR  Use siempre calzado, an dentro de su casa. No camine descalzo. Caminar descalzo facilita que se lastime.  Controle sus pies diariamente para observar ampollas, cortes y enrojecimiento. Si no puede ver la planta del pie, use un espejo o pdale ayuda a otra persona.  Lave sus pies con agua tibia (no use agua caliente) y un jabn suave. Seque bien sus pies, y la zona entre los dedos dando palmaditas, hasta que estn completamente secos. Noremoje los pies, ya que esto puede resecar la piel.  Aplique una locin hidratante o vaselina (que no contenga alcohol ni perfume) en los pies y en las uas secas y quebradizas. No aplique locin entre los dedos.  Recorte las uas en forma recta. No escarbe debajo de las uas o alrededor de las cutculas. Lime los bordes de las uas con una lima o esmeril.  No corte las durezas o callosidades, ni trate de quitarlas con medicamentos.  Use calcetines de algodn o medias limpias todos los das. Asegrese de que no le ajusten demasiado. Nouse calcetines que le lleguen a las rodillas, ya que podran disminuir el flujo de sangre a las piernas.  Use zapatos de cuero que le queden bien y que sean acolchados. Para amoldar los zapatos, clcelos slo algunas horas por da. Esto evitar lesiones en los pies. Revise  siempre los zapatos antes de ponerlos para asegurarse de que no haya objetos en su interior.  No cruce las piernas. Esto puede disminuir el flujo de sangre a los pies.  Si algo le ha raspado, cortado o lastimado la piel de los pies, mantenga la piel de esa zona limpia y seca. Debe higienizar estas zonas con agua y un jabn suave. No limpie la zona con agua oxigenada, alcohol ni yodo.  Cuando se quite un vendaje adhesivo, asegrese de no daar la piel.  Si tiene una herida, obsrvela varias veces por da para asegurarse de que se est curando.  No use bolsas de agua caliente ni almohadillas trmicas. Podran causar quemaduras. Si ha perdido la sensibilidad en los pies o las piernas, no sabr lo que le est sucediendo hasta que sea demasiado tarde.  Asegrese de que su mdico le haga un examen completo de los pies por lo menos una vez al ao, o con ms frecuencia si usted tiene problemas en los pies. Informe todos los cortes, llagas o moretones a su mdico inmediatamente. SOLICITE ATENCIN MDICA SI:   Tiene una lesin que no se cura.  Tiene cortes o rajaduras en la piel.  Tiene una ua encarnada.  Nota una zona irritada en las piernas o los pies.  Siente una sensacin de ardor u hormigueo en las piernas o los pies.  Siente dolor o calambres en las piernas o los pies.  Las piernas o los pies estn adormecidos.    Siente los pies siempre fros. SOLICITE ATENCIN MDICA DE INMEDIATO SI:   Presenta enrojecimiento, hinchazn o aumento del dolor en una herida.  Nota una lnea roja que sube por pierna.  Aparece pus en la herida.  Le sube la fiebre o segn lo que le indique el mdico.  Advierte un olor ftido que proviene de una lcera o una herida. Document Released: 03/11/2005 Document Revised: 11/11/2012 Exit[employer] Patient Information 2015 [employer]. This information is not intended to replace advice given to you by your health care provider. Make sure you discuss any questions  you have with your health care provider.  

## 2014-08-03 NOTE — [place] Notes (Signed)
   Subjective:    Patient ID: Billy Gregory, male    DOB: 11-24-1980, 34 y.o.   MRN: [id number]  HPI  N- dicolored, thick but no pain L- B/L toe nails D-3 years O-slowly C- no pain A- embarrassing and cannot wear sandals T-yes but does not remember the name   "discolored and thick nails, B/L trim"  Patient denies any history of foot ulcer or claudication  Review of Systems  Constitutional: Positive for activity change and appetite change.  Gastrointestinal: Positive for constipation.  Skin:       Change in nails       Objective:   Physical Exam  A Spanish interpreter is present in the treatment room today, Jamas Lav who helps patient respond to questioning. Patient does speak some limited English Patient's infant daughter is in the treatment room with patient today as well  Vascular: DP pulses 4/4 bilaterally PT pulses 4/4 bilaterally Capillary reflex immediate bilaterally No edema noted bilaterally  Neurological: Ankle reflexes equal and reactive bilaterally Vibratory sensation reactive bilaterally Sensation to 10 g monofilament wire intact 5/5 bilaterally  Dermatological: No skin lesions noted The toenails have texture and color changes with hypertrophy in the hallux toenails bilaterally  Musculoskeletal: No deformities noted There is no restriction ankle, subtalar, midtarsal joints bilaterally      Assessment & Plan:   Assessment: Satisfactory neurovascular status Diabetic without complications Mycotic toenails 6-10  Plan: Discuss treatment options with patient with interpreter explaining options including no treatment, topical, oral or repetitive debridement. Patient is interested in active treatment and would be willing to consider oral medication  The nails were debrided and nail fragments submitted for PAS and fungal culture  Notify patient upon receipt of fungal culture and PAS stain

## 2014-08-05 ENCOUNTER — Encounter: Payer: Self-pay | Admitting: Podiatry

## 2014-08-08 ENCOUNTER — Encounter: Payer: Self-pay | Admitting: Dietician

## 2014-08-08 ENCOUNTER — Other Ambulatory Visit: Payer: Self-pay | Admitting: Internal Medicine

## 2014-08-08 ENCOUNTER — Encounter: Payer: Self-pay | Attending: Internal Medicine | Admitting: Dietician

## 2014-08-08 DIAGNOSIS — E119 Type 2 diabetes mellitus without complications: Secondary | ICD-10-CM | POA: Insufficient documentation

## 2014-08-08 DIAGNOSIS — Z794 Long term (current) use of insulin: Secondary | ICD-10-CM | POA: Insufficient documentation

## 2014-08-08 DIAGNOSIS — Z713 Dietary counseling and surveillance: Secondary | ICD-10-CM | POA: Insufficient documentation

## 2014-08-08 NOTE — [place] Notes (Signed)
Diabetes Self-Management Education  Visit Type:     Appt. Start Time: 1630 Appt. End Time: 1700  08/08/2014  [name], identified by name and date of birth, is a 34 y.o. male with a diagnosis of Diabetes: Type 2.  Other people present during visit:  Patient, Interpreter (Interpretor Billy Gregory)   ASSESSMENT  There were no vitals taken for this visit. There is no weight on file to calculate BMI.    Subsequent Visit Information:  Since your last visit, have you continued or began the use of a meal plan?: Yes (all the time) How many days a week are you following a meal plan?: 0 (started working in Biomedical scientist) Since your last visit, have you continued or began to exercise on a consistent basis?: Yes (started working in Biomedical scientist) How many days per week are you exercising or participating in a physicial activity for more than 20 minutes?: 6 Since your last visit have you continued or begun to take your medications as prescribed?: Yes (Insulin was discontinued due to frequent low blood sugar) Since your last visit have you had your blood pressure checked?: Yes Is your most recent blood pressure lower, unchanged, or higher since your last visit?: Unchanged (continues to take medication for high blood pressure) Since your last visit are you checking your feet?: Yes How many days per week are you checking your feet?: 7 Weight Loss (lbs): 6 Since your last visit, are you checking your blood glucose at least once a day?: Yes (80-100 most times prior to meals per record that he brought to visit)  Psychosocial:     Patient Belief/Attitude about Diabetes: Motivated to manage diabetes Self-management support: Doctor's office, Family Other persons present: Patient, Interpreter (Interpretor Billy Gregory) Patient Concerns:  (none) Special Needs: Verbal instruction Pensions consultant) Preferred Learning Style: Visual Learning Readiness: Ready  Complications:   Last HgB  A1C per patient/outside source: 5.9 mg/dL (5.9% 07/13/14 decreased from 11.6 (04/03/14)) How often do you check your blood sugar?: 1-2 times/day Fasting Blood glucose range (mg/dL): 70-129 Number of hypoglycemic episodes per month: 0 (none since insulin has been discontinued) Can you tell when your blood sugar is low?: Yes What do you do if your blood sugar is low?: drinks a small amount of regular soda Number of hyperglycemic episodes per week: 0 Have you had a dilated eye exam in the past 12 months?: No (appointment next week) Have you had a dental exam in the past 12 months?: Yes  Diet Intake:  Breakfast: 1 cup beans with cactus, eggs, avacado, radishes, Coffee with milk and splenda Snack (morning): from snack list Lunch: cactus, chicken, beans, salad, 3-4 corn tortillas Snack (afternoon): from snack list Dinner: same as dinner Snack (evening): same as dinner Beverage(s): water, G2 gatorade at times, coffee with splenda and milk  Exercise:  Exercise: Moderate (swimming / aerobic walking) Moderate Exercise amount of time (min / week): 150   Individualized Plan for Diabetes Self-Management Training:   Learning Objective:  Patient will have a greater understanding of diabetes self-management.  Patient education plan per assessed needs and concerns is to attend individual sessions for    Education Topics Reviewed with Patient Today: Treatment of low blood sugar. Blood sugar testing  Healthy eating                      PATIENTS GOALS/Plan (Developed by the patient):  Nutrition: General guidelines for healthy choices and portions discussed Physical Activity: 60 minutes per day,  Exercise 5-7 days per week Medications: take my medication as prescribed Monitoring : test my blood glucose as discussed (note x per day with comment)  Patient Self Evaluation of Goals - Patient rates self as meeting previously set goals:   Nutrition: >75% Physical Activity:  >75% Medications: >75% Monitoring: >75% Problem Solving: 50 - 75 % Reducing Risk: >75% Health Coping: >75%   Plan:  Continue current diet and exercise regime. Continue to check blood sugar as recommended. Continue to take medications as prescribed.  Expected Outcomes:  Demonstrated interest in learning. Expect positive outcomes (very motivated to control diabetes and has shared and tried to motivate others that he knows.)  Education material provided: Spanish handout on the Rule of 15 for treatment of hypoglycermia  If problems or questions, patient to contact team via:  Phone  Future DSME appointment: - PRN

## 2014-08-31 ENCOUNTER — Telehealth: Payer: Self-pay | Admitting: *Deleted

## 2014-08-31 NOTE — Telephone Encounter (Signed)
Pt's fungal culture results are positive per Dr. Phoebe Perch review and pt needs to be scheduled for an appt to discuss the results.

## 2014-09-07 ENCOUNTER — Ambulatory Visit (INDEPENDENT_AMBULATORY_CARE_PROVIDER_SITE_OTHER): Payer: Medicaid Other | Admitting: Podiatry

## 2014-09-07 ENCOUNTER — Encounter: Payer: Self-pay | Admitting: Podiatry

## 2014-09-07 VITALS — BP 142/78 | HR 69 | Resp 12

## 2014-09-07 DIAGNOSIS — Z79899 Other long term (current) drug therapy: Secondary | ICD-10-CM | POA: Diagnosis not present

## 2014-09-07 DIAGNOSIS — B351 Tinea unguium: Secondary | ICD-10-CM

## 2014-09-07 LAB — HEPATIC FUNCTION PANEL
ALT: 13 U/L (ref 0–53)
AST: 14 U/L (ref 0–37)
Albumin: 4.4 g/dL (ref 3.5–5.2)
Alkaline Phosphatase: 40 U/L (ref 39–117)
Bilirubin, Direct: 0.2 mg/dL (ref 0.0–0.3)
Indirect Bilirubin: 0.6 mg/dL (ref 0.2–1.2)
TOTAL PROTEIN: 7.5 g/dL (ref 6.0–8.3)
Total Bilirubin: 0.8 mg/dL (ref 0.2–1.2)

## 2014-09-07 MED ORDER — TERBINAFINE HCL 250 MG PO TABS: 250.0000 mg | ORAL_TABLET | Freq: Every day | ORAL | Status: DC

## 2014-09-07 NOTE — [place] Notes (Signed)
Patient ID: Billy Gregory, male   DOB: April 07, 1980, 34 y.o.   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 I recommended oral medication. Spanish interpreter reviewed findings and patient opted for terbinafine 250 mg #30 one daily 90 doses  Lab request issued baseline issued for liver profile. Contact patient if liver profile within normal limits call patient to began terbinafine 250 mg #30 one daily 2 refills.  Reappoint 30 days

## 2014-09-08 ENCOUNTER — Encounter: Payer: Self-pay | Admitting: Internal Medicine

## 2014-09-08 ENCOUNTER — Ambulatory Visit: Payer: Medicaid Other | Attending: Internal Medicine | Admitting: Internal Medicine

## 2014-09-08 VITALS — BP 119/80 | HR 102 | Wt 162.4 lb

## 2014-09-08 DIAGNOSIS — E119 Type 2 diabetes mellitus without complications: Secondary | ICD-10-CM | POA: Diagnosis not present

## 2014-09-08 MED ORDER — METFORMIN HCL 1000 MG PO TABS: 500.0000 mg | ORAL_TABLET | Freq: Two times a day (BID) | ORAL | Status: DC

## 2014-09-08 NOTE — Progress Notes (Signed)
Patient ID: Billy Gregory, male   DOB: 22-Feb-1981, 34 y.o.   MRN: [id number] CC: hypoglycemia   HPI: Billy Gregory is a 34 y.o. male here today for a follow up visit.  Patient has past medical history of type 2 diabetes mellitus. He states that although he discontinued taking Lantus 2 months ago he is continuing to have hypoglycemic events. He currently takes Metformin 1,000 mg twice daily. He has had sugars as low as 70 and he has to eat every three hours to prevent passing out. He is also concerned about discrepancies in his glucometer. He reports checking his sugar with one meter reading 167 and another that read 103.   Patient has No headache, No chest pain, No abdominal pain - No Nausea, No new weakness tingling or numbness, No Cough - SOB.  No Known Allergies Past Medical History  Diagnosis Date  . Diabetes mellitus without complication    Current Outpatient Prescriptions on File Prior to Visit  Medication Sig Dispense Refill  . acetaminophen (TYLENOL) 325 MG tablet Take 650 mg by mouth every 6 (six) hours as needed.    . Blood Glucose Monitoring Suppl (RELION CONFIRM GLUCOSE MONITOR) W/DEVICE KIT 1 each by Does not apply route once. 1 kit 0  . lisinopril (PRINIVIL,ZESTRIL) 5 MG tablet Take 1 tablet (5 mg total) by mouth daily. 30 tablet 4  . terbinafine (LAMISIL) 250 MG tablet Take 1 tablet (250 mg total) by mouth daily. 30 tablet 2  . TRUETEST TEST test strip TEST YOUR BLOOD SUGAR DAILY IN THE MORNING OR WHEN FEELING BADLY. 100 each 3   No current facility-administered medications on file prior to visit.   Family History  Problem Relation Age of Onset  . Diabetes Paternal Aunt    History   Social History  . Marital Status: Single    Spouse Name: N/A  . Number of Children: N/A  . Years of Education: N/A   Occupational History  .      Housekeeping   Social History Main Topics  . Smoking status: Never Smoker   . Smokeless tobacco: Never Used  . Alcohol  Use: No  . Drug Use: No  . Sexual Activity: Not on file   Other Topics Concern  . 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    Review of Systems: See HPI   Objective:   Filed Vitals:   09/08/14 1455  BP: 119/80  Pulse: 102    Physical Exam  Cardiovascular: Normal rate, regular rhythm and normal heart sounds.   Pulmonary/Chest: Effort normal and breath sounds normal.     Lab Results  Component Value Date   WBC 8.8 04/03/2014   HGB 14.2 04/03/2014   HCT 39.4 04/03/2014   MCV 77.6* 04/03/2014   PLT 181 04/03/2014   Lab Results  Component Value Date   CREATININE 0.74 04/05/2014   BUN 10 04/05/2014   NA 133* 04/05/2014   K 3.6 04/05/2014   CL 103 04/05/2014   CO2 24 04/05/2014    Lab Results  Component Value Date   HGBA1C 5.9 07/13/2014   Lipid Panel     Component Value Date/Time   CHOL 144 04/04/2014 0501   TRIG 352* 04/04/2014 0501   HDL 20* 04/04/2014 0501   CHOLHDL 7.2 04/04/2014 0501   VLDL 70* 04/04/2014 0501   LDLCALC 54 04/04/2014 0501       Assessment and plan:   Billy Gregory  was seen today for diabetes follow up.  Diagnoses and all orders for this visit:  Type 2 diabetes mellitus without complication Orders: -     metFORMIN (GLUCOPHAGE) 1000 MG tablet; Take 0.5 tablets (500 mg total) by mouth 2 (two) times daily with a meal. I have decreased the Metformin to 500 mg twice daily and have him to come back in 2 weeks to see if he had had any improvements. If he continues to have low sugars I may decrease dose to 500 mg XR.   Due to language barrier, an interpreter was present during the history-taking and subsequent discussion (and for part of the physical exam) with this patient.  Return for 2 weeks PCP-sugar review .       Chari Manning, NP-C [facility] [phone] 09/08/2014, 5:26 PM

## 2014-09-08 NOTE — Patient Instructions (Signed)
Bring two meters back on Monday and I will have a nurse to briefly show you how to quality control check your meter

## 2014-09-08 NOTE — Progress Notes (Signed)
Pt is here for follow up on Diabetes since he is not taking insulin anymore he has noticed his blood sugars dropping. He would like to discuss this.

## 2014-09-12 ENCOUNTER — Ambulatory Visit: Payer: Medicaid Other | Attending: Internal Medicine

## 2014-09-12 DIAGNOSIS — E119 Type 2 diabetes mellitus without complications: Secondary | ICD-10-CM

## 2014-09-13 ENCOUNTER — Telehealth: Payer: Self-pay | Admitting: *Deleted

## 2014-09-13 NOTE — Telephone Encounter (Addendum)
-----   Message from Lolita Rieger sent at 09/13/2014  2:03 PM EDT ----- Left message requesting pt call for lab results and instructions.  I informed pt of the results and Dr. Phoebe Perch instructions.  ----- Message -----    From: Gean Birchwood, DPM    Sent: 09/13/2014   8:09 AM      To: [place] [place]  Hepatic function dated 09/07/2014 within normal limits Contact patient having begin terbinafine 250 mg #30 one by mouth daily

## 2014-09-13 NOTE — Progress Notes (Signed)
Patient came in on 09/12/14 for glucometer to be QCd.

## 2014-10-05 ENCOUNTER — Ambulatory Visit (INDEPENDENT_AMBULATORY_CARE_PROVIDER_SITE_OTHER): Payer: Medicaid Other | Admitting: Podiatry

## 2014-10-05 ENCOUNTER — Encounter: Payer: Self-pay | Admitting: Podiatry

## 2014-10-05 VITALS — BP 135/84 | HR 85 | Resp 12

## 2014-10-05 DIAGNOSIS — Z79899 Other long term (current) drug therapy: Secondary | ICD-10-CM

## 2014-10-05 DIAGNOSIS — B351 Tinea unguium: Secondary | ICD-10-CM

## 2014-10-05 NOTE — Patient Instructions (Addendum)
Go to the lab for a repeat hepatic function (liver profile) after you complete 15 doses out of the 30 doses in bottle #2 Our office will contact you about the lab results Unless there is a problem with her lab you've been a complete bottle #2 of terbinafine 250 mg #30, and bottle #3 terbinafine 250 mg #30 The complete dose for toenail fungus is 90 doses No follow-up needed

## 2014-10-06 ENCOUNTER — Encounter: Payer: Self-pay | Admitting: Podiatry

## 2014-10-06 NOTE — Progress Notes (Signed)
Patient ID: Billy Gregory, male   DOB: 12/19/1980, 34 y.o.   MRN: [id number]  Subjective: This patient presents with his Spanish interpreter after completing 30 doses terbinafine 250 mg 1 daily by mouth. At this time he denies any taste disturbances, headaches, rashes or any other bodily complaints from the medication  Objective: Previous lab dated 08/16/2014 can confirms dermatophytes positive for T rubrum. Nail plate plates unchanged in visual appearance  Assessment: Initial satisfactory tolerance of terbinafine 250 mg #30  Plan: I advised patient to continue on terbinafine 250 mg #302 refills. He was instructed to repeat his liver function profile after 15 doses out of 30 in bottle #2. Our office will contact him and make him aware the results. Administers her contraindication he'll complete a total of 90 doses. Gave patient a lab request for hepatic function  No follow-up requested at this time

## 2014-10-26 LAB — HEPATIC FUNCTION PANEL
ALK PHOS: 45 U/L (ref 40–115)
ALT: 16 U/L (ref 9–46)
AST: 17 U/L (ref 10–40)
Albumin: 4.6 g/dL (ref 3.6–5.1)
BILIRUBIN DIRECT: 0.1 mg/dL (ref <=0.2)
BILIRUBIN INDIRECT: 0.6 mg/dL (ref 0.2–1.2)
Total Bilirubin: 0.7 mg/dL (ref 0.2–1.2)
Total Protein: 7.7 g/dL (ref 6.1–8.1)

## 2014-10-28 ENCOUNTER — Telehealth: Payer: Self-pay | Admitting: Internal Medicine

## 2014-10-28 NOTE — Telephone Encounter (Signed)
Patient called to request a med refill for lisinopril (PRINIVIL,ZESTRIL) 5 MG tablet and metFORMIN (GLUCOPHAGE) 1000 MG tablet. Patient uses our Pharmacy.

## 2014-10-31 ENCOUNTER — Telehealth: Payer: Self-pay | Admitting: General Practice

## 2014-10-31 DIAGNOSIS — E119 Type 2 diabetes mellitus without complications: Secondary | ICD-10-CM

## 2014-10-31 MED ORDER — METFORMIN HCL 1000 MG PO TABS: 500.0000 mg | ORAL_TABLET | Freq: Two times a day (BID) | ORAL | Status: DC

## 2014-10-31 MED ORDER — GLUCOSE BLOOD VI STRP: ORAL_STRIP | Status: DC

## 2014-10-31 MED ORDER — LISINOPRIL 5 MG PO TABS: 5.0000 mg | ORAL_TABLET | Freq: Every day | ORAL | Status: DC

## 2014-10-31 NOTE — Telephone Encounter (Signed)
Patient presents to clinic to request medication refill for metFORMIN (GLUCOPHAGE) 1000 MG tablet, lisinopril (PRINIVIL,ZESTRIL) 5 MG tablet, and TRUETEST TEST test strip Please assist  Patient speaks Spanish

## 2014-11-01 ENCOUNTER — Telehealth: Payer: Self-pay | Admitting: *Deleted

## 2014-11-01 NOTE — Telephone Encounter (Addendum)
-----   Message from Gean Birchwood, DPM sent at 11/01/2014  7:52 AM EDT ----- Hepatic function dated 10/25/2014 within normal limits Contact patient and have him continue on terbinafine 250 mg 1 daily for total of 90 doses Note patient speaks limited English Informed pt he could continue his Terbinafine for a total of 90 doses.  Pt states understanding.

## 2014-11-22 ENCOUNTER — Ambulatory Visit: Payer: Self-pay | Attending: Internal Medicine | Admitting: Internal Medicine

## 2014-11-22 ENCOUNTER — Encounter: Payer: Self-pay | Admitting: Internal Medicine

## 2014-11-22 VITALS — BP 134/88 | HR 77 | Temp 97.8°F | Resp 16 | Ht 64.0 in | Wt 161.4 lb

## 2014-11-22 DIAGNOSIS — E781 Pure hyperglyceridemia: Secondary | ICD-10-CM | POA: Insufficient documentation

## 2014-11-22 DIAGNOSIS — E119 Type 2 diabetes mellitus without complications: Secondary | ICD-10-CM | POA: Insufficient documentation

## 2014-11-22 DIAGNOSIS — Z79899 Other long term (current) drug therapy: Secondary | ICD-10-CM | POA: Insufficient documentation

## 2014-11-22 LAB — POCT GLYCOSYLATED HEMOGLOBIN (HGB A1C): HEMOGLOBIN A1C: 5.8

## 2014-11-22 LAB — GLUCOSE, POCT (MANUAL RESULT ENTRY): POC Glucose: 109 mg/dl — AB (ref 70–99)

## 2014-11-22 MED ORDER — METFORMIN HCL 500 MG PO TABS: 250.0000 mg | ORAL_TABLET | Freq: Two times a day (BID) | ORAL | Status: DC

## 2014-11-22 NOTE — Progress Notes (Signed)
Patient ID: Navraj Dreibelbis, male   DOB: 08-26-1980, 34 y.o.   MRN: [id number] SUBJECTIVE: 34 y.o. male for follow up of diabetes. Diabetic Review of Systems - medication compliance: compliant all of the time, diabetic diet compliance: compliant all of the time, home glucose monitoring: is performed regularly, but notes that his sugars are still dropping to low 70's while working. He notes that he is eating more than usual and walking. Further diabetic ROS: no polyuria or polydipsia, no chest pain, dyspnea or TIA's, no numbness, tingling or pain in extremities.    Current Outpatient Prescriptions  Medication Sig Dispense Refill  . acetaminophen (TYLENOL) 325 MG tablet Take 650 mg by mouth every 6 (six) hours as needed.    Marland Kitchen lisinopril (PRINIVIL,ZESTRIL) 5 MG tablet Take 1 tablet (5 mg total) by mouth daily. 30 tablet 4  . metFORMIN (GLUCOPHAGE) 1000 MG tablet Take 0.5 tablets (500 mg total) by mouth 2 (two) times daily with a meal. 60 tablet 2  . terbinafine (LAMISIL) 250 MG tablet Take 1 tablet (250 mg total) by mouth daily. 30 tablet 2  . Blood Glucose Monitoring Suppl (RELION CONFIRM GLUCOSE MONITOR) W/DEVICE KIT 1 each by Does not apply route once. 1 kit 0  . glucose blood (TRUETEST TEST) test strip TEST YOUR BLOOD SUGAR DAILY IN THE MORNING OR WHEN FEELING BADLY. 100 each 3   No current facility-administered medications for this visit.    OBJECTIVE: Appearance: alert, well appearing, and in no distress, oriented to person, place, and time and normal appearing weight. BP 134/88 mmHg  Pulse 77  Temp(Src) 97.8 F (36.6 C)  Resp 16  Ht 5' 4"  (1.626 m)  Wt 161 lb 6.4 oz (73.211 kg)  BMI 27.69 kg/m2  SpO2 100%  Exam: heart sounds normal rate, regular rhythm, normal S1, S2, no murmurs, rubs, clicks or gallops, no carotid bruits, feet: warm, good capillary refill and no trophic changes or ulcerative lesions  ASSESSMENT: Diabetes Mellitus: well controlled Will repeat bmet when he  returns for labs later this week Hypertriglyceridemia: will repeat lipid, if still elevated I will place him on lovaza.    PLAN: See orders for this visit as documented in the electronic medical record. Issues reviewed with him: low cholesterol diet, weight control and daily exercise discussed and annual eye examinations at Ophthalmology discussed.  Interpreter was used to communicate directly with patient for the entire encounter including providing detailed patient instructions.   Return for Friday Lab visit and 3 mo PCP DM.  Lance Bosch, NP 11/22/2014 9:25 PM

## 2014-11-22 NOTE — Progress Notes (Signed)
Interpreter line used [place] [phone] Patient here for follow up on his diabetes Patient is concerned about his blood sugars dropping down between 70-80 Patient states he has been eating more and walking Patient was a little confused about how he should be taking his metformin

## 2014-11-25 ENCOUNTER — Ambulatory Visit: Payer: Self-pay | Attending: Internal Medicine

## 2014-11-25 DIAGNOSIS — E119 Type 2 diabetes mellitus without complications: Secondary | ICD-10-CM

## 2014-11-25 LAB — BASIC METABOLIC PANEL
BUN: 17 mg/dL (ref 7–25)
CALCIUM: 8.7 mg/dL (ref 8.6–10.3)
CO2: 27 mmol/L (ref 20–31)
Chloride: 105 mmol/L (ref 98–110)
Creat: 0.78 mg/dL (ref 0.60–1.35)
GLUCOSE: 119 mg/dL — AB (ref 65–99)
POTASSIUM: 4.5 mmol/L (ref 3.5–5.3)
SODIUM: 138 mmol/L (ref 135–146)

## 2014-11-29 ENCOUNTER — Telehealth: Payer: Self-pay

## 2014-11-29 NOTE — Telephone Encounter (Signed)
Interpreter line used JOSE ID# [id number] Patient not available  Left message on voice mail to return our call

## 2014-11-29 NOTE — Telephone Encounter (Signed)
-----   Message from Lance Bosch, NP sent at 11/28/2014 10:18 AM EDT ----- Labs are within normal limits

## 2015-02-07 ENCOUNTER — Other Ambulatory Visit: Payer: Self-pay | Admitting: Pharmacist

## 2015-02-07 MED ORDER — GLUCOSE BLOOD VI STRP: ORAL_STRIP | Status: DC

## 2015-02-07 MED ORDER — TRUE METRIX METER W/DEVICE KIT: PACK | Status: DC

## 2015-04-04 MED FILL — LISINOPRIL 5 MG TABLET: 5 | 30 days supply | Qty: 30 | Fill #3

## 2015-04-04 MED FILL — TRUEplus LANCETS 28G MISC: 30 days supply | Qty: 100 | Fill #3

## 2015-04-04 MED FILL — TRUE METRIX TEST STRIP: 25 days supply | Qty: 100 | Fill #1

## 2015-04-11 ENCOUNTER — Ambulatory Visit: Payer: Self-pay | Attending: Internal Medicine | Admitting: Internal Medicine

## 2015-04-11 ENCOUNTER — Encounter: Payer: Self-pay | Admitting: Internal Medicine

## 2015-04-11 VITALS — BP 133/80 | HR 63 | Temp 98.0°F | Resp 16 | Ht 64.0 in | Wt 164.0 lb

## 2015-04-11 DIAGNOSIS — E119 Type 2 diabetes mellitus without complications: Secondary | ICD-10-CM | POA: Insufficient documentation

## 2015-04-11 DIAGNOSIS — Z79899 Other long term (current) drug therapy: Secondary | ICD-10-CM | POA: Insufficient documentation

## 2015-04-11 DIAGNOSIS — Z7984 Long term (current) use of oral hypoglycemic drugs: Secondary | ICD-10-CM | POA: Insufficient documentation

## 2015-04-11 LAB — POCT GLYCOSYLATED HEMOGLOBIN (HGB A1C): Hemoglobin A1C: 5.8

## 2015-04-11 LAB — GLUCOSE, POCT (MANUAL RESULT ENTRY): POC Glucose: 119 mg/dl — AB (ref 70–99)

## 2015-04-11 MED ORDER — LISINOPRIL 5 MG PO TABS: 5.0000 mg | ORAL_TABLET | Freq: Every day | ORAL | Status: DC

## 2015-04-11 MED ORDER — METFORMIN HCL 500 MG PO TABS: 250.0000 mg | ORAL_TABLET | Freq: Every evening | ORAL | Status: DC

## 2015-04-11 NOTE — Progress Notes (Signed)
Interpreter line used [name] ID# [id number] Patient here for follow up on his diabetes and general check up

## 2015-04-11 NOTE — Progress Notes (Signed)
Patient ID: Billy Gregory, male   DOB: Jun 05, 1980, 35 y.o.   MRN: [id number] SUBJECTIVE: 35 y.o. male for follow up of diabetes. Diabetic Review of Systems - medication compliance: compliant all of the time, diabetic diet compliance: compliant all of the time, home glucose monitoring: is performed regularly, fasting values range 76-110, further diabetic ROS: no polyuria or polydipsia, no chest pain, dyspnea or TIA's, no numbness, tingling or pain in extremities, no unusual visual symptoms, no hypoglycemia.  Other symptoms and concerns: States that he is not having hypoglycemic events but he still has to eat extra while at work to prevent his sugars from dropping too low even while on the reduced dose of Metformin 250 mg BID.  Current Outpatient Prescriptions  Medication Sig Dispense Refill  . acetaminophen (TYLENOL) 325 MG tablet Take 650 mg by mouth every 6 (six) hours as needed.    Marland Kitchen lisinopril (PRINIVIL,ZESTRIL) 5 MG tablet Take 1 tablet (5 mg total) by mouth daily. 30 tablet 4  . metFORMIN (GLUCOPHAGE) 500 MG tablet Take 0.5 tablets (250 mg total) by mouth 2 (two) times daily with a meal. 60 tablet 3  . Blood Glucose Monitoring Suppl (TRUE METRIX METER) W/DEVICE KIT USE AS INSTRUCTED 1 kit 0  . glucose blood (TRUE METRIX BLOOD GLUCOSE TEST) test strip TEST BLOOD SUGAR DAILY IN THE MORNING OR WHEN FEELING BALDY 100 each 12  . terbinafine (LAMISIL) 250 MG tablet Take 1 tablet (250 mg total) by mouth daily. 30 tablet 2   No current facility-administered medications for this visit.  Review of Systems: Other than what is stated in HPI, all other systems are negative.   OBJECTIVE: Appearance: alert, well appearing, and in no distress, oriented to person, place, and time and normal appearing weight. BP 133/80 mmHg  Pulse 63  Temp(Src) 98 F (36.7 C)  Resp 16  Ht 5' 4"  (1.626 m)  Wt 164 lb (74.39 kg)  BMI 28.14 kg/m2  SpO2 100%  Exam: heart sounds normal rate, regular rhythm, normal S1,  S2, no murmurs, rubs, clicks or gallops, no JVD, no hepatosplenomegaly, no carotid bruits, feet: warm, good capillary refill and no trophic changes or ulcerative lesions  ASSESSMENT:  Diabetes Mellitus: well controlled. His A1C is much improved. I will switch him to taking Metformin 250 mg only once in the evenings to prevent hypoglycemia at work. Explained to patient and his wife that he may eventually be able to come off medication.  Due to language barrier, an interpreter was present during the history-taking and subsequent discussion (and for part of the physical exam) with this patient.   Return for 1 week lab visit and 3 mo PCP .  Lance Bosch, NP 04/11/2015 6:14 PM

## 2015-04-11 NOTE — Patient Instructions (Addendum)
Come back later this week or next week fasting (no breakfast) and get labs drawn if you can. It will only take about 10 minutes. We open at 9 am. I need to check your cholesterol

## 2015-04-17 ENCOUNTER — Ambulatory Visit: Payer: Self-pay | Attending: Internal Medicine

## 2015-04-17 DIAGNOSIS — E119 Type 2 diabetes mellitus without complications: Secondary | ICD-10-CM

## 2015-04-17 LAB — LIPID PANEL
CHOL/HDL RATIO: 4.7 ratio (ref <=5.0)
Cholesterol: 161 mg/dL (ref 125–200)
HDL: 34 mg/dL — AB (ref >=40)
LDL CALC: 104 mg/dL (ref <130)
Triglycerides: 117 mg/dL (ref <150)
VLDL: 23 mg/dL (ref <30)

## 2015-04-17 LAB — BASIC METABOLIC PANEL
BUN: 14 mg/dL (ref 7–25)
CALCIUM: 9.4 mg/dL (ref 8.6–10.3)
CO2: 25 mmol/L (ref 20–31)
Chloride: 105 mmol/L (ref 98–110)
Creat: 0.85 mg/dL (ref 0.60–1.35)
Glucose, Bld: 118 mg/dL — ABNORMAL HIGH (ref 65–99)
POTASSIUM: 5.1 mmol/L (ref 3.5–5.3)
SODIUM: 140 mmol/L (ref 135–146)

## 2015-04-25 ENCOUNTER — Telehealth: Payer: Self-pay

## 2015-04-25 NOTE — Telephone Encounter (Signed)
-----   Message from Lance Bosch, NP sent at 04/25/2015  7:50 AM EST ----- Cholesterol has come up higher than last year.Please go over things that increase cholesterol levels such as breads pasta, rice, tortillas, butters, fried foods, etc.  Please explain that high cholesterol places him at risk for stroke and heart disease especially being a diabetic. If he wants to avoid medication use he should really focus on these changes

## 2015-04-25 NOTE — Telephone Encounter (Signed)
Interpreter line used Billy Gregory ID# [phone] Called patient this am  Patient is aware of his lab results and to focus of diet and exercise

## 2015-05-16 ENCOUNTER — Other Ambulatory Visit: Payer: Self-pay | Admitting: Internal Medicine

## 2015-05-16 MED FILL — LISINOPRIL 5 MG TABLET: 5 | 30 days supply | Qty: 30 | Fill #4

## 2015-05-16 MED FILL — metFORMIN HCL 1000 MG TABS: 1000 | 30 days supply | Qty: 30 | Fill #2

## 2015-05-16 MED FILL — TRUE METRIX TEST STRIP: 25 days supply | Qty: 100 | Fill #2

## 2015-05-16 MED FILL — TRUEplus LANCETS 28G MISC: 25 days supply | Qty: 100 | Fill #0

## 2015-06-14 MED FILL — LISINOPRIL 5 MG TABLET: 5 | 30 days supply | Qty: 30 | Fill #0

## 2015-06-16 MED FILL — TRUE METRIX TEST STRIP: 25 days supply | Qty: 100 | Fill #3

## 2015-06-23 ENCOUNTER — Encounter: Payer: Self-pay | Admitting: Internal Medicine

## 2015-06-23 ENCOUNTER — Ambulatory Visit: Payer: Self-pay | Attending: Internal Medicine | Admitting: Internal Medicine

## 2015-06-23 VITALS — BP 136/86 | HR 82 | Temp 99.2°F | Resp 16 | Ht 64.0 in | Wt 155.4 lb

## 2015-06-23 DIAGNOSIS — E119 Type 2 diabetes mellitus without complications: Secondary | ICD-10-CM | POA: Insufficient documentation

## 2015-06-23 DIAGNOSIS — D2362 Other benign neoplasm of skin of left upper limb, including shoulder: Secondary | ICD-10-CM

## 2015-06-23 DIAGNOSIS — D367 Benign neoplasm of other specified sites: Secondary | ICD-10-CM | POA: Insufficient documentation

## 2015-06-23 DIAGNOSIS — Z7984 Long term (current) use of oral hypoglycemic drugs: Secondary | ICD-10-CM | POA: Insufficient documentation

## 2015-06-23 DIAGNOSIS — Z79899 Other long term (current) drug therapy: Secondary | ICD-10-CM | POA: Insufficient documentation

## 2015-06-23 LAB — GLUCOSE, POCT (MANUAL RESULT ENTRY): POC GLUCOSE: 105 mg/dL — AB (ref 70–99)

## 2015-06-23 LAB — POCT GLYCOSYLATED HEMOGLOBIN (HGB A1C): HEMOGLOBIN A1C: 5.4

## 2015-06-23 MED ORDER — TRUE METRIX METER W/DEVICE KIT: PACK | Status: DC

## 2015-06-23 NOTE — Progress Notes (Signed)
Patient's here for DM f/up. Patient reports feeling good.  Patient reports a bump on left arm x1.57mo now. Patient denies any pain.  Patient requesting a new glucometer.

## 2015-06-23 NOTE — Progress Notes (Signed)
Patient ID: Billy Gregory, male   DOB: 1980/12/24, 35 y.o.   MRN: [id number] SUBJECTIVE: 35 y.o. male for follow up of diabetes. Diabetic Review of Systems - medication compliance: compliant all of the time, diabetic diet compliance: compliant all of the time, further diabetic ROS: no polyuria or polydipsia, no chest pain, dyspnea or TIA's, no numbness, tingling or pain in extremities, no unusual visual symptoms, no hypoglycemia.  Other symptoms and concerns: bump on left upper arm that is not painful or tender to touch. Area has been present for 2 months and has not increased in size.  Current Outpatient Prescriptions  Medication Sig Dispense Refill  . acetaminophen (TYLENOL) 325 MG tablet Take 650 mg by mouth every 6 (six) hours as needed.    . Blood Glucose Monitoring Suppl (TRUE METRIX METER) W/DEVICE KIT USE AS INSTRUCTED 1 kit 0  . glucose blood (TRUE METRIX BLOOD GLUCOSE TEST) test strip TEST BLOOD SUGAR DAILY IN THE MORNING OR WHEN FEELING BALDY 100 each 12  . lisinopril (PRINIVIL,ZESTRIL) 5 MG tablet Take 1 tablet (5 mg total) by mouth daily. 30 tablet 4  . metFORMIN (GLUCOPHAGE) 500 MG tablet Take 0.5 tablets (250 mg total) by mouth every evening. 60 tablet 3  . terbinafine (LAMISIL) 250 MG tablet Take 1 tablet (250 mg total) by mouth daily. 30 tablet 2  . TRUEPLUS LANCETS 28G MISC USE TO CHECK BLOOD SUGARS 3 TIMES DAILY 100 each 3   No current facility-administered medications for this visit.   Review of Systems: Other than what is stated in HPI, all other systems are negative.   OBJECTIVE: Appearance: alert, well appearing, and in no distress, oriented to person, place, and time and normal appearing weight. BP 136/86 mmHg  Pulse 82  Temp(Src) 99.2 F (37.3 C) (Oral)  Resp 16  Ht _0  (1.626 m)  Wt 155 lb 6.4 oz (70.489 kg)  BMI 26.66 kg/m2  SpO2 82%  Exam: heart sounds normal rate, regular rhythm, normal S1, S2, no murmurs, rubs, clicks or gallops, no JVD, chest clear,  no carotid bruits  ASSESSMENT: Billy Gregory was seen today for follow-up and diabetes.  Diagnoses and all orders for this visit:  Type 2 diabetes mellitus without complication, unspecified long term insulin use status (HCC) -     Microalbumin, urine -     Glucose (CBG) -     POCT A1C -     Blood Glucose Monitoring Suppl (TRUE METRIX METER) w/Device KIT; USE AS INSTRUCTED Patient feels more comfortable with his diabetes and would like to try coming off his Metformin for the next 3 months. I agree with this plan since patient was only on Metfromin 250 mg once per day.  I have explained that he should really attempt to make sure his diet stays the same as now and her exercises to keep his A1C at goal. I have also explained that if his A1C increases to much he may require going back on low dose Metformin.   Cyst, dermoid, arm, left No treatment needed at this time. Return precautions discussed with patient.  Interpreter was used to communicate directly with patient for the entire encounter including providing detailed patient instructions.   Return in about 3 months (around 09/22/2015) for Diabetes Mellitus.    Lance Bosch, NP 06/24/2015 12:58 PM

## 2015-06-24 LAB — MICROALBUMIN, URINE: Microalb, Ur: 2.1 mg/dL

## 2015-07-11 MED FILL — TRUE METRIX TEST STRIP: 25 days supply | Qty: 100 | Fill #4

## 2015-07-11 MED FILL — LISINOPRIL 5 MG TABLET: 5 | 30 days supply | Qty: 30 | Fill #0

## 2015-07-11 MED FILL — TRUE METRIX BLOOD GLUCOSE M: W/DEVICE | 365 days supply | Qty: 1 | Fill #0

## 2015-08-11 ENCOUNTER — Other Ambulatory Visit: Payer: Self-pay | Admitting: Internal Medicine

## 2015-08-11 MED FILL — TRUEplus LANCETS 28G MISC: 30 days supply | Qty: 100 | Fill #0

## 2015-08-14 ENCOUNTER — Other Ambulatory Visit: Payer: Self-pay | Admitting: Internal Medicine

## 2015-08-14 MED FILL — LISINOPRIL 5 MG TABLET: 5 | 30 days supply | Qty: 30 | Fill #0

## 2015-09-19 ENCOUNTER — Ambulatory Visit: Payer: Self-pay | Attending: Internal Medicine | Admitting: Internal Medicine

## 2015-09-19 ENCOUNTER — Encounter: Payer: Self-pay | Admitting: Internal Medicine

## 2015-09-19 VITALS — BP 120/75 | HR 67 | Temp 97.7°F | Wt 158.0 lb

## 2015-09-19 DIAGNOSIS — Z79899 Other long term (current) drug therapy: Secondary | ICD-10-CM | POA: Insufficient documentation

## 2015-09-19 DIAGNOSIS — I10 Essential (primary) hypertension: Secondary | ICD-10-CM | POA: Insufficient documentation

## 2015-09-19 DIAGNOSIS — E119 Type 2 diabetes mellitus without complications: Secondary | ICD-10-CM

## 2015-09-19 DIAGNOSIS — E781 Pure hyperglyceridemia: Secondary | ICD-10-CM

## 2015-09-19 LAB — GLUCOSE, POCT (MANUAL RESULT ENTRY): POC GLUCOSE: 102 mg/dL — AB (ref 70–99)

## 2015-09-19 MED ORDER — LISINOPRIL 5 MG PO TABS: 5.0000 mg | ORAL_TABLET | Freq: Every day | ORAL | Status: DC

## 2015-09-19 MED ORDER — PRAVASTATIN SODIUM 20 MG PO TABS: 20.0000 mg | ORAL_TABLET | Freq: Every day | ORAL | Status: DC

## 2015-09-19 MED FILL — PRAVASTATIN NA 20 MG TAB: 20 | 30 days supply | Qty: 30 | Fill #0

## 2015-09-19 MED FILL — ?LISINOPRIL 5 MG TABLET: 5 | 30 days supply | Qty: 30 | Fill #0

## 2015-09-19 NOTE — Progress Notes (Signed)
Billy Gregory, is a 35 y.o. male  MVV:[id number]  PYY:[id number]  DOB - 1980-11-05  CC:  Chief Complaint  Patient presents with  . Establish Care    Re establish Care       HPI: Billy Gregory is a 35 y.o. male here today to establish medical care, last seen in clinic 3/17.  Has been doing very well, off metformin, only taking lisinopril 5 daily.  Good carb controlled diet, glu at home ranging 90-110. Last weekend, he did end up eating more carbs than normal, and did not feel well afterwards. Glucose at time 160, and took  A pill of metformin he had at home.  Pt w/o other c/o.  Patient has No headache, No chest pain, No abdominal pain - No Nausea, No new weakness tingling or numbness, No Cough - SOB.  Here w/ wife and dgt.  Interpreter was used to communicate directly with patient for the entire encounter including providing detailed patient instructions.   ([place] [phone] on ipad)  Review of Systems: Per HPI, o/w all systems reviewed and negative.   No Known Allergies Past Medical History  Diagnosis Date  . Diabetes mellitus without complication ([place])   . Hypertension    Current Outpatient Prescriptions on File Prior to Visit  Medication Sig Dispense Refill  . acetaminophen (TYLENOL) 325 MG tablet Take 650 mg by mouth every 6 (six) hours as needed.    . Blood Glucose Monitoring Suppl (TRUE METRIX METER) w/Device KIT USE AS INSTRUCTED 1 kit 0  . glucose blood (TRUE METRIX BLOOD GLUCOSE TEST) test strip TEST BLOOD SUGAR DAILY IN THE MORNING OR WHEN FEELING BALDY 100 each 12  . terbinafine (LAMISIL) 250 MG tablet Take 1 tablet (250 mg total) by mouth daily. 30 tablet 2  . TRUEPLUS LANCETS 28G MISC USE TO CHECK BLOOD SUGARS 3 TIMES DAILY 100 each 3   No current facility-administered medications on file prior to visit.   Family History  Problem Relation Age of Onset  . Diabetes Paternal Aunt    Social History   Social History  . Marital Status:  Single    Spouse Name: N/A  . Number of Children: N/A  . Years of Education: N/A   Occupational History  .      Housekeeping   Social History Main Topics  . Smoking status: Never Smoker   . Smokeless tobacco: Never Used  . Alcohol Use: No  . Drug Use: No  . Sexual Activity: Not on file   Other Topics Concern  . 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    Objective:   Filed Vitals:   09/19/15 1639  BP: 120/75  Pulse: 67  Temp: 97.7 F (36.5 C)    Filed Weights   09/19/15 1639  Weight: 158 lb (71.668 kg)    BP Readings from Last 3 Encounters:  09/19/15 120/75  06/23/15 136/86  04/11/15 133/80    Physical Exam: Constitutional: Patient appears well-developed and well-nourished. No distress. AAOx3, pleasant,  HENT: Normocephalic, atraumatic, External right and left ear normal. Oropharynx is clear and moist.  Eyes: Conjunctivae and EOM are normal. PERRL, no scleral icterus. Neck: Normal ROM. Neck supple. No JVD.  CVS: RRR, S1/S2 +, no murmurs, no gallops, no carotid bruit.  Pulmonary: Effort and breath sounds normal, no stridor, rhonchi, wheezes, rales.  Abdominal: Soft. BS +, no distension, tenderness, rebound or guarding.  Musculoskeletal: Normal range of motion.  No edema and no tenderness.   Foot exam: bilateral peripheral pulses 2+ (dorsalis pedis and post tibialis pulses), no ulcers noted/no ecchymosis, warm to touch, monofilament testing 3/3 bilat. Sensation intact.  No c/c/e.  Neuro: Alert. Normal reflexes 2+ knees bilaterally, muscle tone coordination wnl. No cranial nerve deficit grossly. Skin: Skin is warm and dry. No rash noted. Not diaphoretic. No erythema. No pallor. Psychiatric: Normal mood and affect. Behavior, judgment, thought content normal.  Lab Results  Component Value Date   WBC 8.8 04/03/2014   HGB 14.2 04/03/2014   HCT 39.4 04/03/2014   MCV 77.6* 04/03/2014   PLT 181 04/03/2014   Lab  Results  Component Value Date   CREATININE 0.85 04/17/2015   BUN 14 04/17/2015   NA 140 04/17/2015   K 5.1 04/17/2015   CL 105 04/17/2015   CO2 25 04/17/2015    Lab Results  Component Value Date   HGBA1C 5.4 06/23/2015   Lipid Panel     Component Value Date/Time   CHOL 161 04/17/2015 0906   TRIG 117 04/17/2015 0906   HDL 34* 04/17/2015 0906   CHOLHDL 4.7 04/17/2015 0906   VLDL 23 04/17/2015 0906   LDLCALC 104 04/17/2015 0906       Depression screen PHQ 2/9 09/19/2015 06/23/2015 04/11/2015 09/08/2014 09/08/2014  Decreased Interest 0 0 0 0 0  Down, Depressed, Hopeless 0 0 0 0 0  PHQ - 2 Score 0 0 0 0 0  Altered sleeping - - - - -  Tired, decreased energy - - - - -  Change in appetite - - - - -  Feeling bad or failure about yourself  - - - - -  Trouble concentrating - - - - -  Moving slowly or fidgety/restless - - - - -  Suicidal thoughts - - - - -  PHQ-9 Score - - - - -    Assessment and plan:   1. Type 2 diabetes mellitus without complication, unspecified long term insulin use status (HCC) - very well controlled, off all meds, diet controlled, last a1c 5.4, suspect initial a1c very high due to acute illness/hemoconcentration - POCT glucose (manual entry) 102 - no more metformin -Pravastatin 22m po qday started for cvs prophylaxis and cardiac protection, per current recommendations. - continue lisinopril 52mqday for now. Low salt/dash diet recd, if remains normal next visit, may consider taking off. - chk cbc/bmp today.   Return in about 6 months (around 03/20/2016), or if symptoms worsen or fail to improve.  The patient was given clear instructions to go to ER or return to medical center if symptoms don't improve, worsen or new problems develop. The patient verbalized understanding. The patient was told to call to get lab results if they haven't heard anything in the next week.    This note has been created with DrAutomotive engineerAny transcriptional errors are unintentional.   DaMaren ReamerMD, MBWakefieldrRichmondNCLincolnia 09/19/2015, 5:11 PM

## 2015-09-19 NOTE — Patient Instructions (Addendum)
La diabetes mellitus y los alimentos (Diabetes Mellitus and Food) Es importante que controle su nivel de azcar en la sangre (glucosa). El nivel de glucosa en sangre depende en gran medida de lo que usted come. Comer alimentos saludables en las cantidades [place] a lo largo del Training and development officer, aproximadamente a la misma hora [employer], lo ayudar a Chief Technology Officer su nivel de Multimedia programmer. Tambin puede ayudarlo a retrasar o Patent attorney de la diabetes mellitus. Comer de [employer] saludable incluso puede ayudarlo a Chartered loss adjuster de presin arterial y a Science writer o Theatre manager un peso saludable.  Entre las recomendaciones generales para alimentarse y Audiological scientist los alimentos de forma saludable, se incluyen las siguientes:  Respetar las comidas principales y comer colaciones con regularidad. Evitar pasar largos perodos sin comer con el fin de perder peso.  Seguir una dieta que consista principalmente en alimentos de origen vegetal, como frutas, vegetales, frutos secos, legumbres y cereales integrales.  Utilizar mtodos de coccin a baja temperatura, como hornear, en lugar de mtodos de coccin a alta temperatura, como frer en abundante aceite. Trabaje con el nutricionista para aprender a Financial planner nutricional de las etiquetas de los alimentos. CMO PUEDEN AFECTARME LOS ALIMENTOS? Carbohidratos Los carbohidratos afectan el nivel de glucosa en sangre ms que cualquier otro tipo de alimento. El nutricionista lo ayudar a Teacher, adult education cuntos carbohidratos puede consumir en cada comida y ensearle a contarlos. El recuento de carbohidratos es importante para mantener la glucosa en sangre en un nivel saludable, en especial si utiliza insulina o toma determinados medicamentos para la diabetes mellitus. Alcohol El alcohol puede provocar disminuciones sbitas de la glucosa en sangre (hipoglucemia), en especial si utiliza insulina o toma determinados medicamentos para la diabetes mellitus. La  hipoglucemia es una afeccin que puede poner en peligro la vida. Los sntomas de la hipoglucemia (somnolencia, mareos y Data processing manager) son similares a los sntomas de haber consumido mucho alcohol.  Si el mdico lo autoriza a beber alcohol, hgalo con moderacin y siga estas pautas:  Las mujeres no deben beber ms de un trago por da, y los hombres no deben beber ms de dos tragos por Training and development officer. Un trago es igual a:  12 onzas (355 ml) de cerveza  5 onzas de vino (150 ml) de vino  1,5onzas (23m) de bebidas espirituosas  No beba con el estmago vaco.  Mantngase hidratado. Beba agua, gaseosas dietticas o t helado sin azcar.  Las gaseosas comunes, los jugos y otros refrescos podran contener muchos carbohidratos y se dCivil Service fast streamer QU ALIMENTOS NO SE RECOMIENDAN? Cuando haga las elecciones de alimentos, es importante que recuerde que todos los alimentos son distintos. Algunos tienen menos nutrientes que otros por porcin, aunque podran tener la misma cantidad de caloras o carbohidratos. Es difcil darle al cuerpo lo que necesita cuando consume alimentos con menos nutrientes. Estos son algunos ejemplos de alimentos que debera evitar ya que contienen muchas caloras y carbohidratos, pero pocos nutrientes:  GPhysicist, medicaltrans (la mayora de los alimentos procesados incluyen grasas trans en la etiqueta de Informacin nutricional).  Gaseosas comunes.  Jugos.  Caramelos.  Dulces, como tortas, pasteles, rosquillas y g[place]  Comidas fritas. QU ALIMENTOS PUEDO COMER? Consuma alimentos ricos en nutrientes, que nutrirn el cuerpo y lo mantendrn saludable. Los alimentos que debe comer tambin dependern de varios factores, como:  Las caloras que necesita.  Los medicamentos que toma.  Su peso.  El nivel de glucosa en sMarist College  El nArrow Rockde presin arterial.  El nivel de colesterol.  Debe consumir una amplia variedad de alimentos, por ejemplo:  Protenas.  Cortes de [employer].  Protenas con bajo contenido de grasas saturadas, como pescado, clara de huevo y frijoles. Evite las carnes procesadas.  Frutas y vegetales.  Frutas y Photographer que pueden ayudar a Chief Technology Officer los niveles sanguneos de [place], como [place], mangos y batatas.  Productos lcteos.  Elija productos lcteos sin grasa o con bajo contenido de [place], como [place], yogur y [place].  Cereales, panes, pastas y arroz.  Elija cereales integrales, como panes multicereales, avena en grano y arroz integral. Estos alimentos pueden ayudar a controlar la presin arterial.  Daphene Jaeger.  Alimentos que contengan grasas saludables, como frutos secos, Musician, aceite de [place], aceite de canola y pescado. TODOS LOS QUE PADECEN DIABETES MELLITUS TIENEN EL [place] PLAN DE [place]? Dado que todas las personas que padecen diabetes mellitus son distintas, no hay un solo plan de comidas que funcione para todos. Es muy importante que se rena con un nutricionista que lo ayudar a crear un plan de comidas adecuado para usted.   Esta informacin no tiene Marine scientist el consejo del mdico. Asegrese de hacerle al mdico cualquier pregunta que tenga.   Document Released: 06/18/2007 Document Revised: 04/01/2014 Elsevier Interactive Patient Education 2016 [place] para comer fuera de su casa si tiene diabetes (Tips for Eating Away From Home If You Have Diabetes) El control del nivel de glucemia, que tambin se conoce como azcar en la [place], puede ser un reto, que se complica an ms cuando uno no prepara sus propias comidas. Los siguientes consejos pueden ayudarlo a Chief Technology Officer la diabetes cuando come fuera de su casa. PLANIFICACIN Organcese si sabe que comer fuera de su casa:  Pregntele al mdico cmo sincronizar las comidas y el medicamento si est en tratamiento con insulina.  Haga una lista de restaurantes cercanos que ofrezcan opciones saludables. Si tienen un men que pueda leer en  su casa, llvelo y planifique lo que pedir con anticipacin.  Busque informacin en lnea del restaurante donde quiera comer. Muchos restaurantes de comida rpida y cadenas de restaurantes incluyen la informacin nutricional en lnea. Tenga en cuenta esta informacin para elegir las opciones ms saludables y calcular los carbohidratos de la comida.  Use un libro de recuento de carbohidratos o una aplicacin mvil para fijarse en el contenido de carbohidratos y el tamao de porcin de lo que desea comer.  Comience a Corporate treasurer de las porciones y a Marine scientist cuntas porciones hay en una unidad. Esto le permitir calcular la cantidad de carbohidratos que puede comer. ALIMENTOS LIBRES Un "alimento libre" es cualquier alimento o bebida que contenga menos de 5g de carbohidratos por porcin. Entre los alimentos libres, se incluyen los siguientes:  Muchos vegetales.  Huevos duros.  Nueces o semillas.  Aceitunas.  Quesos.  Carnes. Estos tipos de alimentos son buenas opciones de bocadillos y en general estn disponibles en los bufs de ensaladas. Como aderezos "libres" para [place], puede usarse jugo de limn, vinagre o un aderezo de bajas caloras (con menos de 20caloras por porcin).  OPCIONES PARA REDUCIR LOS CARBOHIDRATOS  Reemplace el yogur descremado endulzado por el yogur sin azcar. Tambin puede consumir yogur a base de [place] de [place], pero es conveniente una opcin sin azcar o natural, porque tiene menos contenido de carbohidratos.  Pdale al mozo que retire la canasta de pan o las papas de la mesa.  Pida frutas frescas. El buf de ensaladas a menudo ofrece frutas  frescas. Evite las frutas enlatadas, ya que por lo general tienen azcar o almbar.  Pida una ensalada y cmala sin aderezo. Tambin puede crear un aderezo "libre" para ensaladas.  Pida que le [employer] alimentos. Por ejemplo, en lugar de papas fritas, pida una porcin de vegetales, como una ensalada,  judas verdes o brcoli. OTROS CONSEJOS   Si [place] insulina, adminstrela una vez que la comida llegue a la mesa, as las Hotel manager.  Pregntele al mozo sobre el tamao de la porcin antes de pedir la comida y, si la porcin es ms grande de lo que usted debe consumir, pida una caja para llevarse la comida a su casa. Cuando llegue la comida, deje en el plato la cantidad que debe comer y coloque el resto en la caja para llevar.  Considere la posibilidad de Publishing rights manager un plato principal con alguien y de pedir una ensalada como guarnicin.   Esta informacin no tiene Marine scientist el consejo del mdico. Asegrese de hacerle al mdico cualquier pregunta que tenga.   Document Released: 03/11/2005 Document Revised: 11/30/2014 Elsevier Interactive Patient Education [employer].   - Diabetes y actividad fsica (Diabetes and Exercise) Hacer actividad fsica con regularidad es muy importante. No se trata solo de [employer]. Tiene muchos otros beneficios, como por ejemplo:  Mejorar el estado fsico, la flexibilidad y la resistencia.  Aumenta la densidad sea.  Ayuda a Technical sales engineer.  Disminuye la Air traffic controller.  Aumenta la fuerza muscular.  Reduce el estrs y las tensiones.  Mejora el estado de salud general. Las personas diabticas que realizan actividad fsica tienen beneficios adicionales debido al ejercicio:  Reduce el apetito.  El organismo mejora el uso del azcar (glucosa) de la [place].  Ayuda a disminuir o Product/process development scientist.  Disminuye la presin arterial.  Ayuda a disminuir los lpidos en la sangre (colesterol y triglicridos).  El organismo mejora el uso de la insulina porque:  Aumenta la sensibilidad del organismo a la insulina.  Reduce las necesidades de insulina del organismo.  Disminuye el riesgo de enfermedad cardaca por la actividad fsica ya que  disminuye el colesterol y [employer] triglicridos.  Aumenta  los niveles de colesterol bueno (como las lipoprotenas de alta densidad [HDL]) en el organismo.  Disminuye los niveles de glucosa en la [place]. SU PLAN DE ACTIVIDAD  Elija una actividad que disfrute y establezca objetivos realistas. Para ejercitarse sin riesgos, debe comenzar a Psychologist, prison and probation services cualquier actividad fsica nueva lentamente y aumentar la intensidad del ejercicio de forma gradual con el tiempo. Su mdico o educador en diabetes podrn ayudarlo a crear un plan de actividades que lo beneficie. Las recomendaciones generales incluyen lo siguiente:  Psychologist, clinical a los nios para que realicen al menos 60 minutos de actividad fsica Armed forces operational officer.  Estirarse y Optometrist ejercicios de entrenamiento de la fuerza, como yoga o levantamiento de pesas, por lo menos 2 veces por semana.  Realizar en total por lo menos 150 minutos de ejercicios de intensidad moderada cada semana, como caminar a paso ligero o hacer gimnasia acutica.  Hacer ejercicio fsico por lo menos 3 das por semana y no dejar pasar ms de 2 das seguidos sin ejercitarse.  Evitar los perodos largos de inactividad (90 minutos o ms tiempo). Cuando deba pasar mucho tiempo sentado, haga pausas frecuentes para caminar o estirarse. RECOMENDACIONES PARA REALIZAR EJERCICIOS CUANDO SE TIENE DIABETES TIPO 1 O TIPO 2   Controle la glucosa en la sangre antes de comenzar.  Si el nivel de glucosa en la sangre es de ms de 240 mg/dl, controle las cetonas en la [place]. No haga actividad fsica si hay cetonas.  Evite inyectarse insulina en las zonas del cuerpo que ejercitar. Por ejemplo, evite inyectarse insulina en:  Los brazos, si juega al tenis.  Las piernas, si corre.  Lleve un registro de:  Los alimentos que consume antes y despus de TEFL teacher.  Los momentos esperables de picos de accin de la insulina.  Los niveles de glucosa en la sangre antes y despus de hacer ejercicios.  El tipo y cantidad de [place] fsica que  Musician.  Revise los registros con su mdico. El mdico lo ayudar a Actor pautas para ajustar la cantidad de alimento y las cantidades de insulina antes y despus de Field seismologist ejercicios.  Si toma insulina o agentes hipoglucemiantes por va oral, observe si hay signos y sntomas de hipoglucemia. Entre los que se incluyen:  Mareos.  Temblores.  Sudoracin.  Escalofros.  Confusin.  Beba gran cantidad de agua mientras hace ejercicios para evitar la deshidratacin o los golpes de Freight forwarder. Durante la actividad fsica se pierde agua corporal que se debe reponer.  Comente con su mdico antes de comenzar un programa de actividad fsica para verificar que sea seguro para usted. Recuerde, cualquier actividad es mejor que ninguna.   Esta informacin no tiene Marine scientist el consejo del mdico. Asegrese de hacerle al mdico cualquier pregunta que tenga.   Document Released: 03/31/2007 Document Revised: 07/26/2014 Elsevier Interactive Patient Education 2016 [place] de alimentacin DASH (DASH Eating Plan) DASH es la sigla en ingls de "Enfoques Alimentarios para Detener la Hipertensin". El plan de alimentacin DASH ha demostrado bajar la presin arterial elevada (hipertensin). Los beneficios adicionales para la salud pueden incluir la disminucin del riesgo de diabetes mellitus tipo2, enfermedades cardacas e ictus. Este plan tambin puede ayudar a Horticulturist, commercial. QU DEBO SABER ACERCA DEL PLAN DE ALIMENTACIN DASH? Para el plan de alimentacin DASH, seguir las siguientes pautas generales:  Elija los alimentos con un valor porcentual diario de sodio de menos del 5% (segn figura en la etiqueta del alimento).  Use hierbas o aderezos sin sal, en lugar de sal de mesa o sal marina.  Consulte al mdico o farmacutico antes de usar sustitutos de la sal.  Coma productos con bajo contenido de sodio, cuya etiqueta suele decir "bajo contenido de sodio" o "sin agregado de  sal".  Coma alimentos frescos.  Coma ms verduras, frutas y productos lcteos con bajo contenido de [place].  Elija los cereales integrales. Busque la palabra "integral" en Equities trader de la lista de ingredientes.  Elija el pescado y el pollo o el pavo sin piel ms a menudo que las carnes rojas. Limite el consumo de pescado, carne de ave y carne a 6onzas (170g) por Training and development officer.  Limite el consumo de dulces, postres, azcares y bebidas azucaradas.  Elija las grasas saludables para el corazn.  Limite el consumo de queso a 1onza (28g) por Training and development officer.  Consuma ms comida casera y menos de restaurante, de buf y comida rpida.  Limite el consumo de alimentos fritos.  Cocine los alimentos utilizando mtodos que no sean la fritura.  Limite las verduras enlatadas. Si las consume, enjuguelas bien para disminuir el sodio.  Cuando coma en un restaurante, pida que preparen su comida con menos sal o, en lo posible, sin nada de sal. QU ALIMENTOS PUEDO COMER? Pida ayuda a un nutricionista para conocer las  necesidades calricas individuales. Cereales Pan de salvado o integral. Arroz integral. Pastas de salvado o integrales. Quinua, trigo burgol y cereales integrales. Cereales con bajo contenido de sodio. Tortillas de harina de maz o de salvado. Pan de maz integral. Galletas saladas integrales. Galletas con bajo contenido de [place]. Vegetales Verduras frescas o congeladas (crudas, al vapor, asadas o grilladas). Jugos de tomate y verduras con contenido bajo o reducido de sodio. Pasta y salsa de tomate con contenido bajo o [place]. Verduras enlatadas con bajo contenido de sodio o reducido de sodio.  Lambert Mody Lambert Mody frescas, en conserva (en su jugo natural) o frutas congeladas. Carnes y otros productos con protenas Carne de res molida (al 85% o ms [place]), carne de res de animales alimentados con pastos o carne de res sin la grasa. Pollo o pavo sin piel. Carne de pollo o de [place]. Cerdo  sin la grasa. Todos los pescados y frutos de mar. Huevos. Porotos, guisantes o lentejas secos. Frutos secos y semillas sin sal. Frijoles enlatados sin sal. Lcteos Productos lcteos con bajo contenido de grasas, como [place] o al 1%, quesos reducidos en grasas o al 2%, ricota con bajo contenido de grasas o [employer], o yogur natural con bajo contenido de [place]. Quesos con contenido bajo o reducido de sodio. Grasas y Naval architect en barra que no contengan grasas trans. [place] y alios para ensaladas livianos o reducidos en grasas (reducidos en sodio). [place]. Aceites de crtamo, oliva o canola. [place] natural de man o almendra. Otros Palomitas de maz y pretzels sin sal. Los artculos mencionados arriba pueden no ser [employer] de las bebidas o los alimentos recomendados. Comunquese con el nutricionista para conocer ms opciones. QU ALIMENTOS NO SE RECOMIENDAN? Cereales Pan blanco. Pastas blancas. Arroz blanco. Pan de maz refinado. Bagels y croissants. Galletas saladas que contengan grasas trans. Vegetales Vegetales con crema o fritos. Verduras en [place]. Verduras enlatadas comunes. Pasta y salsa de tomate en lata comunes. Jugos comunes de tomate y de verduras. Lambert Mody Frutas secas. Fruta enlatada en almbar liviano o espeso. Jugo de frutas. Carnes y otros productos con protenas [place] de carne con Lobbyist. Costillas, alas de pollo, tocineta, salchicha, mortadela, salame, chinchulines, tocino, perros calientes, salchichas alemanas y embutidos envasados. Frutos secos y semillas con sal. Frijoles con sal en lata. Lcteos Leche entera o al 2%, crema, mezcla de [place] y crema, y queso crema. Yogur entero o endulzado. Quesos o queso azul con alto contenido de Physicist, medical. [place] no lcteas y coberturas batidas. Quesos procesados, quesos para untar o cuajadas. Condimentos Sal de cebolla y ajo, sal condimentada, sal de mesa y sal marina. Salsas en lata y  envasadas. Salsa [place]shire. Salsa trtara. Salsa barbacoa. Salsa teriyaki. Salsa de soja, incluso la que tiene contenido reducido de [place]. Salsa de carne. Salsa de pescado. Salsa de [place]. Salsa rosada. Rbano picante. Ketchup y mostaza. Saborizantes y tiernizantes para carne. Caldo en cubitos. Salsa picante. Salsa tabasco. Adobos. Aderezos para tacos. Salsas. Grasas y aceites [place], [place] en barra, [place] de [place], [place], [place] clarificada y Wendee Copp de tocino. Aceites de coco, de palmiste o de palma. Aderezos comunes para ensalada. Otros Pickles y Donalds. Palomitas de maz y pretzels con sal. Los artculos mencionados arriba pueden no ser [employer] de las bebidas y los alimentos que se Higher education careers adviser. Comunquese con el nutricionista para obtener ms informacin. DNDE Dolan Amen MS INFORMACIN? [place], del Pulmn y de Herbalist (National Heart, Lung, and  Blood Institute): travelstabloid.com   Esta informacin no tiene como fin reemplazar el consejo del mdico. Asegrese de hacerle al mdico cualquier pregunta que tenga.   Document Released: 02/28/2011 Document Revised: 04/01/2014 Elsevier Interactive Patient Education [employer].

## 2015-09-20 LAB — BASIC METABOLIC PANEL WITH GFR
BUN: 18 mg/dL (ref 7–25)
CALCIUM: 9.4 mg/dL (ref 8.6–10.3)
CHLORIDE: 105 mmol/L (ref 98–110)
CO2: 24 mmol/L (ref 20–31)
Creat: 0.94 mg/dL (ref 0.60–1.35)
GFR, Est African American: >89 mL/min (ref >=60)
GFR, Est Non African American: >89 mL/min (ref >=60)
Glucose, Bld: 110 mg/dL — ABNORMAL HIGH (ref 65–99)
Potassium: 3.9 mmol/L (ref 3.5–5.3)
Sodium: 137 mmol/L (ref 135–146)

## 2015-09-20 LAB — CBC WITH DIFFERENTIAL/PLATELET
BASOS ABS: 92 {cells}/uL (ref 0–200)
Basophils Relative: 1 %
EOS ABS: 276 {cells}/uL (ref 15–500)
EOS PCT: 3 %
HCT: 41.9 % (ref 38.5–50.0)
HEMOGLOBIN: 14.9 g/dL (ref 13.2–17.1)
LYMPHS ABS: 2668 {cells}/uL (ref 850–3900)
Lymphocytes Relative: 29 %
MCH: 29.3 pg (ref 27.0–33.0)
MCHC: 35.6 g/dL (ref 32.0–36.0)
MCV: 82.5 fL (ref 80.0–100.0)
MONO ABS: 552 {cells}/uL (ref 200–950)
MONOS PCT: 6 %
MPV: 11.4 fL (ref 7.5–12.5)
NEUTROS PCT: 61 %
Neutro Abs: 5612 cells/uL (ref 1500–7800)
PLATELETS: 228 10*3/uL (ref 140–400)
RBC: 5.08 MIL/uL (ref 4.20–5.80)
RDW: 13.8 % (ref 11.0–15.0)
WBC: 9.2 10*3/uL (ref 3.8–10.8)

## 2015-09-21 MED FILL — TRUE METRIX TEST STRIP: 25 days supply | Qty: 100 | Fill #5

## 2015-09-21 MED FILL — TRUEplus LANCETS 28G MISC: 30 days supply | Qty: 100 | Fill #0

## 2015-10-20 MED FILL — PRAVASTATIN NA 20 MG TAB: 20 | 30 days supply | Qty: 30 | Fill #1

## 2015-10-27 MED FILL — TRUEplus LANCETS 28G MISC: 30 days supply | Qty: 100 | Fill #1

## 2015-10-27 MED FILL — TRUE METRIX TEST STRIP: 25 days supply | Qty: 100 | Fill #6

## 2015-11-01 ENCOUNTER — Other Ambulatory Visit: Payer: Self-pay | Admitting: Internal Medicine

## 2015-11-01 DIAGNOSIS — E119 Type 2 diabetes mellitus without complications: Secondary | ICD-10-CM

## 2015-11-01 MED FILL — !TRUE METRIX BLOOD GLUCOSE: 1 days supply | Qty: 1 | Fill #0

## 2015-11-15 MED FILL — ?LISINOPRIL 5 MG TABLET: 5 | 30 days supply | Qty: 30 | Fill #1

## 2015-11-28 MED FILL — PRAVASTATIN NA 20 MG TAB: 20 | 30 days supply | Qty: 30 | Fill #2

## 2015-11-30 MED FILL — TRUEplus LANCETS 28G MISC: 30 days supply | Qty: 100 | Fill #2

## 2015-11-30 MED FILL — TRUE METRIX TEST STRIP: 25 days supply | Qty: 100 | Fill #7

## 2015-12-13 MED FILL — ?LISINOPRIL 5 MG TABLET: 5 | 30 days supply | Qty: 30 | Fill #2

## 2015-12-27 MED FILL — PRAVASTATIN NA 20 MG TAB: 20 | 30 days supply | Qty: 30 | Fill #3

## 2015-12-27 MED FILL — TRUEplus LANCETS 28G MISC: 30 days supply | Qty: 100 | Fill #3

## 2016-01-12 MED FILL — TRUE METRIX TEST STRIP: 25 days supply | Qty: 100 | Fill #8

## 2016-01-15 MED FILL — LISINOPRIL 5 MG TABLET: 5 | 30 days supply | Qty: 30 | Fill #3

## 2016-01-29 MED FILL — PRAVASTATIN NA 20 MG TAB: 20 | 30 days supply | Qty: 30 | Fill #4

## 2016-02-19 MED FILL — LISINOPRIL 5 MG TABLET: 5 | 30 days supply | Qty: 30 | Fill #4

## 2016-03-04 ENCOUNTER — Encounter: Payer: Self-pay | Admitting: Internal Medicine

## 2016-03-04 ENCOUNTER — Ambulatory Visit: Payer: Self-pay | Attending: Internal Medicine | Admitting: Internal Medicine

## 2016-03-04 VITALS — BP 138/80 | HR 75 | Temp 98.8°F | Resp 16 | Wt 161.4 lb

## 2016-03-04 DIAGNOSIS — Z114 Encounter for screening for human immunodeficiency virus [HIV]: Secondary | ICD-10-CM | POA: Insufficient documentation

## 2016-03-04 DIAGNOSIS — Z7984 Long term (current) use of oral hypoglycemic drugs: Secondary | ICD-10-CM | POA: Insufficient documentation

## 2016-03-04 DIAGNOSIS — Z23 Encounter for immunization: Secondary | ICD-10-CM | POA: Insufficient documentation

## 2016-03-04 DIAGNOSIS — E119 Type 2 diabetes mellitus without complications: Secondary | ICD-10-CM | POA: Insufficient documentation

## 2016-03-04 DIAGNOSIS — E785 Hyperlipidemia, unspecified: Secondary | ICD-10-CM | POA: Insufficient documentation

## 2016-03-04 DIAGNOSIS — Z79899 Other long term (current) drug therapy: Secondary | ICD-10-CM | POA: Insufficient documentation

## 2016-03-04 DIAGNOSIS — I10 Essential (primary) hypertension: Secondary | ICD-10-CM | POA: Insufficient documentation

## 2016-03-04 LAB — BASIC METABOLIC PANEL WITH GFR
BUN: 14 mg/dL (ref 7–25)
CHLORIDE: 104 mmol/L (ref 98–110)
CO2: 29 mmol/L (ref 20–31)
CREATININE: 0.81 mg/dL (ref 0.60–1.35)
Calcium: 9.5 mg/dL (ref 8.6–10.3)
GFR, Est African American: >89 mL/min (ref >=60)
GFR, Est Non African American: >89 mL/min (ref >=60)
Glucose, Bld: 103 mg/dL — ABNORMAL HIGH (ref 65–99)
Potassium: 4.6 mmol/L (ref 3.5–5.3)
SODIUM: 139 mmol/L (ref 135–146)

## 2016-03-04 LAB — GLUCOSE, POCT (MANUAL RESULT ENTRY): POC Glucose: 96 mg/dl (ref 70–99)

## 2016-03-04 LAB — POCT GLYCOSYLATED HEMOGLOBIN (HGB A1C): Hemoglobin A1C: 5.8

## 2016-03-04 MED ORDER — METFORMIN HCL ER 500 MG PO TB24: 500.0000 mg | ORAL_TABLET | Freq: Every day | ORAL | 3 refills | Status: DC

## 2016-03-04 MED ORDER — LISINOPRIL 5 MG PO TABS: 5.0000 mg | ORAL_TABLET | Freq: Every day | ORAL | 3 refills | Status: DC

## 2016-03-04 MED ORDER — PRAVASTATIN SODIUM 20 MG PO TABS: 20.0000 mg | ORAL_TABLET | Freq: Every day | ORAL | 3 refills | Status: DC

## 2016-03-04 MED FILL — METFORMIN HCL ER 500 MG TAB: 500 | 30 days supply | Qty: 30 | Fill #0

## 2016-03-04 MED FILL — PRAVASTATIN NA 20 MG TAB: 20 | 30 days supply | Qty: 30 | Fill #0

## 2016-03-04 MED FILL — TRUEplus LANCETS 28G MISC: 30 days supply | Qty: 100 | Fill #1

## 2016-03-04 NOTE — Progress Notes (Signed)
Billy Gregory, is a 35 y.o. male  WUJ:[id number]  [id number]  DOB - [birth year]  Chief Complaint  Patient presents with  . Diabetes        Subjective:   Billy Gregory is a 35 y.o. male here today for a follow up visit., last seen 09/19/15 for dm.  Pt states doing well, no c/o, only on lisinopril 5 and pravastatin 20 qhs currently.    Here w/ his young dgts.  Admits to being less rigid on his diet lately, more tortillas, etc.   Patient has No headache, No chest pain, No abdominal pain - No Nausea, No new weakness tingling or numbness, No Cough - SOB.   Interpreter was used to communicate directly with patient for the entire encounter including providing detailed patient instructions.  Problem  Hyperlipidemia  Htn (Hypertension), Benign    ALLERGIES: No Known Allergies  PAST MEDICAL HISTORY: Past Medical History:  Diagnosis Date  . Diabetes mellitus without complication ([place])   . Hypertension     MEDICATIONS AT HOME: Prior to Admission medications   Medication Sig Start Date End Date Taking? Authorizing Provider  acetaminophen (TYLENOL) 325 MG tablet Take 650 mg by mouth every 6 (six) hours as needed.   Yes Historical Provider, MD  Blood Glucose Monitoring Suppl (TRUE METRIX METER) w/Device KIT USE AS INSTRUCTED 11/01/15  Yes Olugbemiga E Jegede, MD  glucose blood (TRUE METRIX BLOOD GLUCOSE TEST) test strip TEST BLOOD SUGAR DAILY IN THE MORNING OR WHEN FEELING BALDY 02/07/15  Yes Lance Bosch, NP  lisinopril (PRINIVIL,ZESTRIL) 5 MG tablet Take 1 tablet (5 mg total) by mouth daily. 03/04/16  Yes Maren Reamer, MD  pravastatin (PRAVACHOL) 20 MG tablet Take 1 tablet (20 mg total) by mouth daily. 03/04/16  Yes Dawn Lazarus Gowda, MD  TRUEPLUS LANCETS 28G MISC USE TO CHECK BLOOD SUGARS 3 TIMES DAILY 08/14/15  Yes Tresa Garter, MD  metFORMIN (GLUCOPHAGE XR) 500 MG 24 hr tablet Take 1 tablet (500 mg total) by mouth daily with breakfast. 03/04/16    Maren Reamer, MD  terbinafine (LAMISIL) 250 MG tablet Take 1 tablet (250 mg total) by mouth daily. Patient not taking: Reported on 03/04/2016 09/07/14   Gean Birchwood, DPM     Objective:   Vitals:   03/04/16 1411  BP: 138/80  Pulse: 75  Resp: 16  Temp: 98.8 F (37.1 C)  TempSrc: Oral  SpO2: 97%  Weight: 161 lb 6.4 oz (73.2 kg)    Exam General appearance : Awake, alert, not in any distress. Speech Clear. Not toxic looking, pleasant. HEENT: Atraumatic and Normocephalic, pupils equally reactive to light. Neck: supple, no JVD.   Chest:Good air entry bilaterally, no added sounds. CVS: S1 S2 regular, no murmurs/gallups or rubs. Abdomen: Bowel sounds active, Non tender and not distended with no gaurding, rigidity or rebound. Foot exam: bilateral peripheral pulses 2+ (dorsalis pedis and post tibialis pulses), no ulcers noted/no ecchymosis, warm to touch, monofilament testing 3/3 bilat. Sensation intact.  No c/c/e. Neurology: Awake alert, and oriented X 3, CN II-XII grossly intact, Non focal Skin:No Rash  Data Review Lab Results  Component Value Date   HGBA1C 5.4 06/23/2015   HGBA1C 5.80 04/11/2015   HGBA1C 5.80 11/22/2014    Depression screen PHQ 2/9 03/04/2016 09/19/2015 06/23/2015 04/11/2015 09/08/2014  Decreased Interest 0 0 0 0 0  Down, Depressed, Hopeless 0 0 0 0 0  PHQ - 2 Score 0 0 0 0 0  Altered sleeping - - - - -  Tired, decreased energy - - - - -  Change in appetite - - - - -  Feeling bad or failure about yourself  - - - - -  Trouble concentrating - - - - -  Moving slowly or fidgety/restless - - - - -  Suicidal thoughts - - - - -  PHQ-9 Score - - - - -      Assessment & Plan   1. Type 2 diabetes mellitus without complication, without long-term current use of insulin ([place]) Lengthy discussion w/ pt re: diet and exercise, which he states he will restart again. Wants to restart metformin for now, and try to get off once better controlled. - POCT glucose  (manual entry) - POCT glycosylated hemoglobin (Hb A1C) 5.8 - BASIC METABOLIC PANEL WITH GFR - Microalbumin/Creatinine Ratio, Urine  2. Hyperlipidemia, unspecified hyperlipidemia type In setting of dm, not currently fasting. Recd continue for cva/cad protection - renewed pravastatin 20 qhs  3. HTN (hypertension), benign - low salt/MS DASH discuss Renewed lisinopril 5 qd, if remains elevated at next visit, will recd hctz added.  4. Encounter for screening for HIV - HIV antibody (with reflex)  6. Pneumococcal 23 and flu vac today 7. Per pt, just had mild cough today from cold weather, afebrile.   Patient have been counseled extensively about nutrition and exercise  Return in about 3 months (around 06/02/2016).  The patient was given clear instructions to go to ER or return to medical center if symptoms don't improve, worsen or new problems develop. The patient verbalized understanding. The patient was told to call to get lab results if they haven't heard anything in the next week.   This note has been created with Surveyor, quantity. Any transcriptional errors are unintentional.   Maren Reamer, MD, [place] and [facility] [place], [place]   03/04/2016, 2:28 PM

## 2016-03-04 NOTE — Patient Instructions (Addendum)
Needs to renew orange card/ cone discount - financial aid  La diabetes mellitus y los alimentos (Diabetes Mellitus and Food) Es importante que controle su nivel de azcar en la sangre (glucosa). El nivel de glucosa en sangre depende en gran medida de lo que usted come. Comer alimentos saludables en las cantidades [place] a lo largo del Training and development officer, aproximadamente a la misma hora [employer], lo ayudar a Chief Technology Officer su nivel de Multimedia programmer. Tambin puede ayudarlo a retrasar o Patent attorney de la diabetes mellitus. Comer de [employer] Services saludable incluso puede ayudarlo a Chartered loss adjuster de presin arterial y a Science writer o Theatre manager un peso saludable. Entre las recomendaciones generales para alimentarse y Audiological scientist los alimentos de forma saludable, se incluyen las siguientes:  Respetar las comidas principales y comer colaciones con regularidad. Evitar pasar largos perodos sin comer con el fin de perder peso.  Seguir una dieta que consista principalmente en alimentos de origen vegetal, como frutas, vegetales, frutos secos, legumbres y cereales integrales.  Utilizar mtodos de coccin a baja temperatura, como hornear, en lugar de mtodos de coccin a alta temperatura, como frer en abundante aceite. Trabaje con el nutricionista para aprender a Financial planner nutricional de las etiquetas de los alimentos. CMO PUEDEN AFECTARME LOS ALIMENTOS? Carbohidratos  Los carbohidratos afectan el nivel de glucosa en sangre ms que cualquier otro tipo de alimento. El nutricionista lo ayudar a Teacher, adult education cuntos carbohidratos puede consumir en cada comida y ensearle a contarlos. El recuento de carbohidratos es importante para mantener la glucosa en sangre en un nivel saludable, en especial si utiliza insulina o toma determinados medicamentos para la diabetes mellitus. Alcohol  El alcohol puede provocar disminuciones sbitas de la glucosa en sangre (hipoglucemia), en especial si utiliza insulina o toma  determinados medicamentos para la diabetes mellitus. La hipoglucemia es una afeccin que puede poner en peligro la vida. Los sntomas de la hipoglucemia (somnolencia, mareos y Data processing manager) son similares a los sntomas de haber consumido mucho alcohol. Si el mdico lo autoriza a beber alcohol, hgalo con moderacin y siga estas pautas:  Las mujeres no deben beber ms de un trago por da, y los hombres no deben beber ms de dos tragos por Training and development officer. Un trago es igual a:  12 onzas (355 ml) de cerveza  5 onzas de vino (150 ml) de vino  1,5onzas (38ml) de bebidas espirituosas  No beba con el estmago vaco.  Mantngase hidratado. Beba agua, gaseosas dietticas o t helado sin azcar.  Las gaseosas comunes, los jugos y otros refrescos podran contener muchos carbohidratos y se Civil Service fast streamer. QU ALIMENTOS NO SE RECOMIENDAN? Cuando haga las elecciones de alimentos, es importante que recuerde que todos los alimentos son distintos. Algunos tienen menos nutrientes que otros por porcin, aunque podran tener la misma cantidad de caloras o carbohidratos. Es difcil darle al cuerpo lo que necesita cuando consume alimentos con menos nutrientes. Estos son algunos ejemplos de alimentos que debera evitar ya que contienen muchas caloras y carbohidratos, pero pocos nutrientes:  Physicist, medical trans (la mayora de los alimentos procesados incluyen grasas trans en la etiqueta de Informacin nutricional).  Gaseosas comunes.  Jugos.  Caramelos.  Dulces, como tortas, pasteles, rosquillas y [place].  Comidas fritas. QU ALIMENTOS PUEDO COMER? Consuma alimentos ricos en nutrientes, que nutrirn el cuerpo y lo mantendrn saludable. Los alimentos que debe comer tambin dependern de varios factores, como:  Las caloras que necesita.  Los medicamentos que toma.  Su peso.  El nivel de glucosa en [place].  El Louann de presin arterial.  El nivel de colesterol. Debe consumir una amplia variedad de alimentos,  por ejemplo:  Protenas.  Cortes de [employer] Corporation.  Protenas con bajo contenido de grasas saturadas, como pescado, clara de huevo y frijoles. Evite las carnes procesadas.  Frutas y vegetales.  Frutas y Photographer que pueden ayudar a Chief Technology Officer los niveles sanguneos de [place], como [place], mangos y batatas.  Productos lcteos.  Elija productos lcteos sin grasa o con bajo contenido de [place], como [place], yogur y [place].  Cereales, panes, pastas y arroz.  Elija cereales integrales, como panes multicereales, avena en grano y arroz integral. Estos alimentos pueden ayudar a controlar la presin arterial.  Daphene Jaeger.  Alimentos que contengan grasas saludables, como frutos secos, Musician, aceite de [place], aceite de canola y pescado. TODOS LOS QUE PADECEN DIABETES MELLITUS TIENEN EL [place] PLAN DE [place]? Dado que todas las personas que padecen diabetes mellitus son distintas, no hay un solo plan de comidas que funcione para todos. Es muy importante que se rena con un nutricionista que lo ayudar a crear un plan de comidas adecuado para usted. Esta informacin no tiene Marine scientist el consejo del mdico. Asegrese de hacerle al mdico cualquier pregunta que tenga. Document Released: 06/18/2007 Document Revised: 04/01/2014 Document Reviewed: 02/05/2013 Elsevier Interactive Patient Education  2017 [place].  -   Diabetes mellitus y actividad fsica (Diabetes Mellitus and Exercise) Hacer actividad fsica habitualmente es importante para el estado de salud general, en especial si tiene diabetes (diabetes mellitus). La actividad fsica no solo se reduce a Pharmacist, hospital. Aporta muchos beneficios para la salud, como aumentar la fuerza muscular y la densidad sea, y reducir las grasas corporales y Dealer. Esto mejora el estado fsico, la flexibilidad y la resistencia, y todo ello redunda en un mejor estado de salud general. La actividad fsica tiene beneficios adicionales para los  diabticos, entre ellos:  Disminuye el apetito.  Ayuda a bajar y Consulting civil engineer glucemia bajo control.  Baja la presin arterial.  Ayuda a controlar las cantidades de sustancias grasas (lpidos) en la [place], como el colesterol y los triglicridos.  Mejora la respuesta del cuerpo a la insulina (optimizacin de la sensibilidad a la insulina).  Reduce la cantidad de insulina que el cuerpo necesita.  Reduce el riesgo de sufrir cardiopata coronaria de la siguiente forma:  [employer] Corporation de colesterol y triglicridos.  Aumenta los niveles de colesterol bueno.  Disminuye la glucemia. QU ES EL PLAN DE ACTIVIDADES? El mdico o un educador para la diabetes certificado pueden ayudarlo a Engineer, petroleum del tipo y de la frecuencia de actividad fsica (plan de actividades) adecuado para usted. Asegrese de lo siguiente:  Haga por lo menos 188minutos semanales de ejercicios de intensidad moderada o vigorosa. Estos podran ser caminatas dinmicas, ciclismo o [place].  Haga ejercicios de elongacin y de fortalecimiento, como yoga o levantamiento de pesas, por lo menos 2veces por semana.  Reparta la actividad en al menos 3das de la semana.  Haga algn tipo de actividad fsica [employer].  No deje pasar ms de 2das seguidos sin hacer algn tipo de actividad fsica.  No permanezca inactivo durante ms de 44minutos seguidos. Tmese descansos frecuentes para caminar o estirarse.  Elija un tipo de ejercicio o de actividad que disfrute y establezca objetivos realistas.  Comience lentamente y aumente de [place] gradual la intensidad del ejercicio con el correr del [place]. QU DEBO SABER ACERCA DEL CONTROL DE LA DIABETES?  Contrlese la glucemia antes y despus de ejercitarse.  Si la glucemia supera los 240mg /dl (13,37mmol/l) antes de ejercitarse, hgase un control de la orina para detectar la presencia de cetonas. Si tiene [employer] orina, no se ejercite  hasta que la glucemia se normalice.  Conozca los sntomas de la glucemia baja (hipoglucemia) y aprenda cmo tratarla. El riesgo de tener hipoglucemia [place] durante y despus de hacer actividad fsica. Los sntomas frecuentes de hipoglucemia pueden incluir los siguientes:  [place].  Ansiedad.  Sudoracin y piel hmeda.  Confusin.  Mareos o sensacin de desvanecimiento.  Aumento de la frecuencia cardaca o palpitaciones.  Visin borrosa.  Hormigueo o adormecimiento alrededor Exxon Mobil Corporation, los labios o la [place].  Temblores o sacudidas.  Irritabilidad.  Tenga una colacin de carbohidratos de accin rpida disponible antes, durante y despus de ejercitarse, a fin de evitar o tratar la hipoglucemia.  Evite inyectarse insulina en las zonas del cuerpo que ejercitar. Por ejemplo, evite inyectarse insulina en:  Los brazos, si juega al tenis.  Las piernas, si corre.  Lleve registros de sus hbitos de actividad fsica. Esto puede ayudarlos a usted y al mdico a Tax adviser de control de la diabetes segn sea necesario. Escriba los siguientes datos:  Los alimentos que consume antes y despus de Field seismologist actividad fsica.  Los niveles de glucosa en la sangre antes y despus de hacer ejercicios.  El tipo y cantidad de [place] fsica que Musician.  Cuando se prev que la insulina alcance su valor mximo, si [place] insulina. No haga actividad fsica en los momentos en que insulina alcanza su valor mximo.  Cuando comience un ejercicio o una actividad nuevos, trabaje con el mdico para asegurarse de que la actividad sea segura para usted y para Secretary/administrator la [place], los medicamentos o la ingesta de alimentos segn sea necesario.  Beba gran cantidad de agua mientras hace ejercicios para evitar la deshidratacin o los golpes de Freight forwarder. Beba suficiente lquido para Consulting civil engineer orina clara o de color amarillo plido. Esta informacin no tiene Marine scientist el consejo del mdico. Asegrese  de hacerle al mdico cualquier pregunta que tenga. Document Released: 03/31/2007 Document Revised: 07/03/2015 Document Reviewed: 08/21/2015 Elsevier Interactive Patient Education  2017 [place] de alimentacin con bajo contenido de sodio (Low-Sodium Eating Plan) El sodio aumenta la presin arterial y hace que el cuerpo retenga lquidos. El consumo de alimentos con menos sodio ayuda a Armed forces technical officer presin arterial, a Forensic psychologist y a Tour manager, el hgado y los riones. Agregar sal (cloruro de sodio) a los alimentos aumenta el aporte de [place]. La mayor parte del sodio proviene de los alimentos enlatados, envasados y congelados. La pizza, la comida rpida y la comida de los restaurantes tambin contienen mucho sodio. Aunque usted tome medicamentos para bajar la presin arterial o reducir el lquido del cuerpo, es importante que disminuya el aporte de sodio de los alimentos. EN QU CONSISTE EL PLAN? La [employer] de las personas deberan limitar la ingesta de sodio a 2300mg  por Training and development officer. El mdico le recomienda que limite su consumo de sodio a 2,000mg  por Training and development officer. QU DEBO SABER ACERCA DE ESTE PLAN DE [place]? Para el plan de alimentacin con bajo contenido de sodio, debe seguir estas pautas generales:  Elija alimentos con un valor porcentual diario de sodio de menos del 5% (segn se indica en la etiqueta).  Use hierbas o aderezos sin sal, en lugar de sal de mesa o sal  marina.  Consulte al mdico o farmacutico antes de usar sustitutos de la sal.  Coma alimentos frescos.  Coma ms frutas y verduras.  Limite las verduras enlatadas. Si las consume, enjuguelas bien para disminuir el sodio.  Limite el consumo de queso a 1onza (28g) por Training and development officer.  Coma productos con bajo contenido de sodio, cuya etiqueta suele decir "bajo contenido de sodio" o "sin agregado de sal".  Evite alimentos que contengan glutamato monosdico (MSG), que a veces se agrega a la comida [place] y a  algunos alimentos enlatados.  Consulte las etiquetas de los alimentos (etiquetas de informacin nutricional) para saber cunto sodio contiene una porcin.  Consuma ms comida casera y menos de restaurante, de buf y comida rpida.  Cuando coma en un restaurante, pida que preparen su comida con menos sal o, en lo posible, sin nada de sal. CMO LEO LA INFORMACIN SOBRE EL SODIO EN LAS ETIQUETAS DE LOS ALIMENTOS? La etiqueta de informacin nutricional indica la cantidad de sodio en una porcin de alimento. Si come ms de una porcin, debe multiplicar la cantidad indicada de sodio por la cantidad de porciones. Las etiquetas de los alimentos tambin pueden indicar lo siguiente:  Sin sodio: menos de 5mg  por porcin.  Cantidad muy baja de sodio: 35mg  o menos por porcin.  Cantidad baja de sodio: 140mg  o menos por porcin.  Menor cantidad de sodio: 50% menos de sodio en una porcin. Por ejemplo, si un alimento generalmente contiene 300 mg de sodio se modifica para ser NVR Inc, tendr 150 mg de sodio.  Sodio reducido: 25% menos de Agricultural consultant. Por ejemplo, si un alimento que por lo general contiene 400mg  de sodio se modifica para convertirse en un alimento de sodio reducido, tendr 300mg  de sodio. QU ALIMENTOS PUEDO COMER? Cereales  Cereales con bajo contenido de sodio, como [place], arroz y trigo [place], y trigo triturado. Galletas con bajo contenido de [place]. Arroz y pastas sin sal. Pan con bajo contenido de [place]. Verduras  Verduras frescas o congeladas. Verduras enlatadas con bajo contenido de sodio o reducido de sodio. Pasta y salsa de tomate con contenido bajo o [place]. Jugos de tomate y verduras con contenido bajo o reducido de sodio. Lambert Mody  Frutas frescas, congeladas y IT sales professional. Jugo de frutas. Carnes y otros productos con protenas  Atn y salmn enlatado con bajo contenido de [place]. Carne de vaca o ave, pescado y frutos de mar frescos o  congelados. Cordero. Frutos secos sin sal. Lentejas, frijoles y guisantes secos, sin sal agregada. Frijoles enlatados sin sal. Sopas caseras sin sal. Huevos. Lcteos  Leche. Leche de soja. Queso ricota. Quesos con contenido bajo o reducido de sodio. Yogur. Condimentos  Hierbas y especias frescas y secas. Aderezos sin sal. [place] y ajo en polvo. Variedades de [place] y ketchup con bajo contenido de sodio. Rbano picante fresco o refrigerado. Jugo de limn. Grasas y aceites  Aderezos para ensalada con contenido reducido de [place]. Mantequilla sin sal. Otros  Palomitas de maz y pretzels sin sal. Los artculos mencionados arriba pueden no ser [employer] Foods Company de las bebidas o los alimentos recomendados. Comunquese con el nutricionista para conocer ms opciones.  QU ALIMENTOS NO SE RECOMIENDAN? Cereales  Cereales instantneos para comer caliente. Mezclas para bizcochos, panqueques y rellenos de pan. Crutones. Mezclas para pastas o arroz con condimento. Envases comerciales de sopa de fideos. Macarrones con queso envasados o congelados. Harina leudante. Galletas saladas comunes. Verduras  Verduras enlatadas comunes. Pasta y [employer]  de tomate en lata comunes. Jugos comunes de tomate y de verduras. Verduras Surveyor, minerals. Papas fritas saladas. Aceitunas. Pepinillos. Salsas. Chucrut. Salsa. Carnes y otros productos con protenas  Carne de vaca, pescado o frutos de mar que est salada, [place], [place], condimentada con especias o con pickles. Panceta, jamn, salchichas, perros calientes, carne curada, carne picada (carne envasada de buey) y embutidos. Cerdo salado. Cecina o charqui. Arenque en escabeche. Anchoas, atn enlatado comn y sardinas. Frutos secos con sal. Ines Bloomer para untar y quesos procesados. Requesn. Queso azul y cottage. Suero de [employer]. Condimentos  Sal de cebolla y ajo, sal condimentada, sal de mesa y sal marina. Salsas en lata y envasadas. Salsa Worcestershire. Salsa  trtara. Salsa barbacoa. Salsa teriyaki. Salsa de soja, incluso la que tiene contenido reducido de [place]. Salsa de carne. Salsa de pescado. Salsa de [place]. Salsa rosada. Rbano picante envasado. Ketchup y mostaza comunes. Saborizantes y tiernizantes para carne. Caldo en cubitos. Salsa picante. Salsa tabasco. Adobos. Aderezos para tacos. Salsas. Grasas y aceites  Aderezos comunes para ensalada. Junction con sal. Margarina. Mantequilla clarificada. Grasa de panceta. Otros  Nachos y papas fritas envasadas. Maz inflado y frituras de maz. Palomitas de maz y pretzels con sal. Sopas enlatadas o en polvo. Pizza. Pasteles y entradas congeladas. Los artculos mencionados arriba pueden no ser [employer] Foods Company de las bebidas y los alimentos que se Higher education careers adviser. Comunquese con el nutricionista para obtener ms informacin.  Esta informacin no tiene Marine scientist el consejo del mdico. Asegrese de hacerle al mdico cualquier pregunta que tenga. Document Released: 03/11/2005 Document Revised: 04/01/2014 Document Reviewed: 01/13/2013 Elsevier Interactive Patient Education  2017 Elsevier Inc. Influenza Virus Vaccine injection (Fluarix) Qu es este medicamento? La VACUNA ANTIGRIPAL ayuda a disminuir el riesgo de contraer la influenza, tambin conocida como la gripe. La vacuna solo ayuda a protegerle contra algunas cepas de influenza. Esta vacuna no ayuda a reducir Catering manager de contraer influenza pandmica [id number]. Este medicamento puede ser utilizado para otros usos; si tiene alguna pregunta consulte con su proveedor de atencin mdica o con su farmacutico. MARCAS COMUNES: Fluarix, Fluzone Qu le debo informar a mi profesional de la salud antes de tomar este medicamento? Necesita saber si usted presenta alguno de los siguientes problemas o situaciones: -trastorno de sangrado como hemofilia -fiebre o infeccin -sndrome de Guillain-Barre u otros problemas neurolgicos -problemas del sistema  inmunolgico -infeccin por el virus de la inmunodeficiencia humana (VIH) o SIDA -niveles bajos de plaquetas en la sangre -esclerosis mltiple -una Risk analyst o inusual a las vacunas antigripales, a los huevos, protenas de pollo, al ltex, a la gentamicina, a otros medicamentos, alimentos, colorantes o conservantes -si est embarazada o buscando quedar embarazada -si est amamantando a un beb Cmo debo utilizar este medicamento? Esta vacuna se administra mediante inyeccin por va intramuscular. Lo administra un profesional de [employer] Home	[place]. Recibir una copia de informacin escrita sobre la vacuna antes de cada vacuna. Asegrese de leer este folleto cada vez cuidadosamente. Este folleto puede cambiar con frecuencia. Hable con su pediatra para informarse acerca del uso de este medicamento en nios. Puede requerir atencin especial. Sobredosis: Pngase en contacto inmediatamente con un centro toxicolgico o una sala de urgencia si usted cree que haya tomado demasiado medicamento. ATENCIN: Con[employer]ra Foods es solo para usted. No comparta este medicamento con nadie. Qu sucede si me olvido de una dosis? No se aplica en este caso. Qu puede interactuar con este medicamento? -quimioterapia o radioterapia -medicamentos que suprimen Industrial/product designer  inmunolgico, tales como etanercept, anakinra, infliximab y adalimumab -medicamentos que tratan o previenen cogulos sanguneos, como warfarina -fenitona -medicamentos esteroideos, como la prednisona o la cortisona -teofilina -vacunas Puede ser que esta lista no menciona todas las posibles interacciones. Informe a su profesional de [employer] Home	[place] de [employer] Corporation productos a base de hierbas, medicamentos de [place] o suplementos nutritivos que est tomando. Si usted fuma, consume bebidas alcohlicas o si utiliza drogas ilegales, indqueselo tambin a su profesional de [employer] Home	[place]. Algunas sustancias pueden interactuar con su medicamento. A qu debo estar  atento al usar Coca[employer]a? Informe a su mdico o a Barrister's clerk de la [employer] todos los efectos secundarios que persistan despus de 3 das. Llame a su proveedor de atencin mdica si se presentan sntomas inusuales dentro de las 6 semanas posteriores a la vacunacin. Es posible que todava pueda contraer la gripe, pero la enfermedad no ser tan fuerte como normalmente. No puede contraer la gripe de esta vacuna. La vacuna antigripal no le protege contra resfros u otras enfermedades que pueden causar [place]. Debe vacunarse cada ao. Qu efectos secundarios puedo tener al Masco Corporation este medicamento? Efectos secundarios que debe informar a su mdico o a Barrister's clerk de la salud tan pronto como sea posible: -reacciones alrgicas como erupcin cutnea, picazn o urticarias, hinchazn de la cara, labios o lengua Efectos secundarios que, por lo general, no requieren atencin mdica (debe informarlos a su mdico o a su profesional de la salud si persisten o si son molestos): -fiebre -dolor de cabeza -molestias y dolores musculares -dolor, sensibilidad, enrojecimiento o Estate agent de la inyeccin -cansancio o debilidad Puede ser que esta lista no menciona todos los posibles efectos secundarios. Comunquese a su mdico por asesoramiento mdico Humana Inc. Usted puede informar los efectos secundarios a la FDA por telfono al 1-800-FDA-1088. Dnde debo guardar mi medicina? Esta vacuna se administra solamente en clnicas, farmacias, consultorio mdico u otro consultorio de un profesional de la salud y no Sports coach en su domicilio. ATENCIN: Este folleto es un resumen. Puede ser que no cubra toda la posible informacin. Si usted tiene preguntas acerca de esta medicina, consulte con su mdico, su farmacutico o su profesional de Technical sales engineer.  2017 Elsevier/Gold Standard (2009-09-12 15:31:40) Edward Jolly antineumoccica de polisacridos: Lo que debe  saber (Pneumococcal Polysaccharide Vaccine: What You Need to Know) 1. Por qu vacunarse? La vacunacin puede proteger a los [employer] (y a algunos nios y adultos ms jvenes) de la enfermedad neumoccica. La enfermedad neumoccica es causada por una bacteria que puede contagiarse de [place] persona a otra por contacto directo. Puede provocar infecciones en los odos y tambin infecciones ms graves en:  los pulmones (neumona),  la sangre (bacteriemia), y  las membranas que cubren el cerebro y la mdula espinal (meningitis). La meningitis puede causar sordera y dao cerebral, y puede ser mortal. Cualquier persona puede contraer la enfermedad neumoccica, pero los nios menores de 2 aos, las personas con [employer], los adultos mayores de 49 aos y los fumadores son los que corren un mayor riesgo. Cada ao, mueren alrededor de State Farm a causa de la enfermedad [employer] States [employer] Corporation [place]. El tratamiento de las infecciones neumoccicas con penicilina y otros medicamentos era ms efectivo en el pasado. Sin embargo, algunas cepas de la bacteria que causa la enfermedad se han vuelto resistentes a estos medicamentos. Esto hace que la prevencin de la enfermedad mediante la vacunacin sea an ms importante. 2. Edward Jolly antineumoccica  de polisacridos (PPSV23) La vacuna antineumoccica de polisacridos (PPSV23) protege contra 23tipos de bacterias neumoccicas. No previene todas las enfermedades neumoccicas. La vacuna PPSV23 se recomienda para las siguientes personas:  Todos los adultos de 65aos en adelante.  Cleora Fleet persona de 2a 64aos que tenga un problema de salud a Barrister's clerk.  [employer] de 2 a 63 aos que tengan el sistema inmunitario dbil.  Los adultos de 19 a 64 aos que fumen o tengan asma. [place] solo necesitan una dosis de la vacuna PPSV. Para ciertos grupos de alto riesgo se recomienda una segunda dosis. [place] de 65 aos deben recibir una dosis de la vacuna, aun si recibieron una o ms dosis antes de [employer]. El mdico puede brindarle ms informacin sobre estas recomendaciones. La mayora de los adultos sanos adquiere proteccin 2 a 3semanas despus de haber recibido la vacuna. 3. Algunas personas no deben recibir la vacuna  Cleora Fleet persona que haya sufrido una reaccin alrgica potencialmente mortal a la PPSV no debe recibir otra dosis.  Las personas que tengan Buyer, retail grave a los componentes de la vacuna PPSV no deben recibirla. Informe a su mdico si sufre de algn tipo de alergia grave.  Cleora Fleet persona que est enferma en un grado moderado o grave el da en que est programada la vacunacin, probablemente deba esperar a recuperarse para recibirla. Por lo general, una persona con una enfermedad leve puede vacunarse.  Los nios menores de 2 aos no deben recibir [employer].  No hay pruebas de que la vacuna PPSV sea perjudicial para las mujeres embarazadas o el feto. No obstante, como precaucin, las mujeres que necesiten aplicarse la vacuna deben hacerlo antes de quedar embarazadas, si es posible. 4. R[place]gos de [place] reaccin a la vacuna Con cualquier medicamento, incluyendo las vacunas, existe la posibilidad de que aparezcan efectos secundarios. Estos son leves y desaparecen por s solos, pero tambin son posibles las reacciones graves. Aproximadamente la mitad de las personas que reciben la PPSV presentan efectos secundarios leves, como enrojecimiento o Management consultant donde se aplic la vacuna, que desaparecen a [place]. Menos de 1 de cada 100personas presenta fiebre, dolores musculares o reacciones locales ms graves. Problemas que podran ocurrir despus de cualquier vacuna:   Las personas a veces se desmayan despus de un procedimiento mdico, incluso despus de recibir Engineer, drilling. Si permanece sentado o recostado durante 15 minutos puede ayudar a Merrill Lynch y  las lesiones causadas por las cadas. Informe al mdico si se siente mareado, tiene cambios en la visin o zumbidos en los odos.  Algunas personas sienten un dolor intenso en el hombro y tienen dificultad para mover el brazo donde se coloc la vacuna. Esto sucede con muy poca frecuencia.  Cualquier medicamento puede causar una reaccin alrgica grave. Dichas reacciones son Orlene Erm poco frecuentes con una vacuna (se calcula que menos de 1en un milln de dosis) y se producen unos minutos a unas horas despus de la vacunacin. Al igual que con cualquier [employer], existe una probabilidad remota de que una vacuna cause una lesin grave o la [place]. Se controla permanentemente la seguridad de las vacunas. Para obtener ms informacin, visite: http://www.aguilar.org/. 5. Qu pasa si hay una reaccin grave? A qu signos debo estar atento?  Observe todo lo que le preocupe, como signos de una reaccin alrgica grave, fiebre muy alta o comportamiento fuera de lo normal. Los signos de una reaccin alrgica grave pueden incluir ronchas,  hinchazn de la cara y la garganta, dificultad para respirar, latidos cardacos acelerados, mareos y debilidad. Generalmente, estos comenzaran entre unos pocos minutos y algunas horas despus de la vacunacin. Qu debo hacer?  Si usted piensa que se trata de una reaccin alrgica grave o de otra emergencia que no puede esperar, llame al 911 o dirjase al hospital ms cercano. Sino, llame a su mdico. Despus, la reaccin debe informarse al Sistema de Informacin sobre Efectos Adversos de las [place] (Vaccine Adverse Event Reporting System, VAERS). Su mdico puede presentar este informe, o puede hacerlo usted mismo a travs del sitio web de VAERS, en [email or web address], o llamando al [phone]. VAERS no brinda recomendaciones mdicas.  6. Cmo puedo obtener ms informacin?  Consulte a su mdico. Este puede darle el prospecto de la vacuna o recomendarle otras fuentes  de informacin.  Comunquese con el servicio de salud de su localidad o su estado.  Comunquese con los Centros para Building surveyor y la Prevencin de Probation officer for Disease Control and Prevention , CDC).  Llame al [phone] (1-800-CDC-INFO), o  Visite el sitio web de [employer] en http://hunter.com/. Declaracin de informacin sobre la vacuna antineumoccica de polisacridos de los CDC (07/16/13) Esta informacin no tiene Marine scientist el consejo del mdico. Asegrese de hacerle al mdico cualquier pregunta que tenga. Document Released: 06/07/2008 Document Revised: 04/01/2014 Document Reviewed: 07/19/2013 Elsevier Interactive Patient Education  2017 [place] diabetes mellitus y los alimentos (Diabetes Mellitus and Food) Es importante que controle su nivel de azcar en la sangre (glucosa). El nivel de glucosa en sangre depende en gran medida de lo que usted come. Comer alimentos saludables en las cantidades [place] a lo largo del Training and development officer, aproximadamente a la misma hora [employer], lo ayudar a Chief Technology Officer su nivel de Multimedia programmer. Tambin puede ayudarlo a retrasar o Patent attorney de la diabetes mellitus. Comer de [employer] Services saludable incluso puede ayudarlo a Chartered loss adjuster de presin arterial y a Science writer o Theatre manager un peso saludable. Entre las recomendaciones generales para alimentarse y Audiological scientist los alimentos de forma saludable, se incluyen las siguientes:  Respetar las comidas principales y comer colaciones con regularidad. Evitar pasar largos perodos sin comer con el fin de perder peso.  Seguir una dieta que consista principalmente en alimentos de origen vegetal, como frutas, vegetales, frutos secos, legumbres y cereales integrales.  Utilizar mtodos de coccin a baja temperatura, como hornear, en lugar de mtodos de coccin a alta temperatura, como frer en abundante aceite. Trabaje con el nutricionista para aprender a Financial planner  nutricional de las etiquetas de los alimentos. CMO PUEDEN AFECTARME LOS ALIMENTOS? Carbohidratos  Los carbohidratos afectan el nivel de glucosa en sangre ms que cualquier otro tipo de alimento. El nutricionista lo ayudar a Teacher, adult education cuntos carbohidratos puede consumir en cada comida y ensearle a contarlos. El recuento de carbohidratos es importante para mantener la glucosa en sangre en un nivel saludable, en especial si utiliza insulina o toma determinados medicamentos para la diabetes mellitus. Alcohol  El alcohol puede provocar disminuciones sbitas de la glucosa en sangre (hipoglucemia), en especial si utiliza insulina o toma determinados medicamentos para la diabetes mellitus. La hipoglucemia es una afeccin que puede poner en peligro la vida. Los sntomas de la hipoglucemia (somnolencia, mareos y Data processing manager) son similares a los sntomas de haber consumido mucho alcohol. Si el mdico lo autoriza a beber alcohol, hgalo con moderacin y siga estas pautas:  Las mujeres no deben beber ms  de un trago por da, y los hombres no deben beber ms de dos tragos por Training and development officer. Un trago es igual a:  12 onzas (355 ml) de cerveza  5 onzas de vino (150 ml) de vino  1,5onzas (64ml) de bebidas espirituosas  No beba con el estmago vaco.  Mantngase hidratado. Beba agua, gaseosas dietticas o t helado sin azcar.  Las gaseosas comunes, los jugos y otros refrescos podran contener muchos carbohidratos y se Civil Service fast streamer. QU ALIMENTOS NO SE RECOMIENDAN? Cuando haga las elecciones de alimentos, es importante que recuerde que todos los alimentos son distintos. Algunos tienen menos nutrientes que otros por porcin, aunque podran tener la misma cantidad de caloras o carbohidratos. Es difcil darle al cuerpo lo que necesita cuando consume alimentos con menos nutrientes. Estos son algunos ejemplos de alimentos que debera evitar ya que contienen muchas caloras y carbohidratos, pero pocos  nutrientes:  Physicist, medical trans (la mayora de los alimentos procesados incluyen grasas trans en la etiqueta de Informacin nutricional).  Gaseosas comunes.  Jugos.  Caramelos.  Dulces, como tortas, pasteles, rosquillas y [place].  Comidas fritas. QU ALIMENTOS PUEDO COMER? Consuma alimentos ricos en nutrientes, que nutrirn el cuerpo y lo mantendrn saludable. Los alimentos que debe comer tambin dependern de varios factores, como:  Las caloras que necesita.  Los medicamentos que toma.  Su peso.  El nivel de glucosa en [place].  El [place] de presin arterial.  El nivel de colesterol. Debe consumir una amplia variedad de alimentos, por ejemplo:  Protenas.  Cortes de [employer] Corporation.  Protenas con bajo contenido de grasas saturadas, como pescado, clara de huevo y frijoles. Evite las carnes procesadas.  Frutas y vegetales.  Frutas y Photographer que pueden ayudar a Chief Technology Officer los niveles sanguneos de [place], como [place], mangos y batatas.  Productos lcteos.  Elija productos lcteos sin grasa o con bajo contenido de [place], como [place], yogur y [place].  Cereales, panes, pastas y arroz.  Elija cereales integrales, como panes multicereales, avena en grano y arroz integral. Estos alimentos pueden ayudar a controlar la presin arterial.  Daphene Jaeger.  Alimentos que contengan grasas saludables, como frutos secos, Musician, aceite de [place], aceite de canola y pescado. TODOS LOS QUE PADECEN DIABETES MELLITUS TIENEN EL [place] PLAN DE [place]? Dado que todas las personas que padecen diabetes mellitus son distintas, no hay un solo plan de comidas que funcione para todos. Es muy importante que se rena con un nutricionista que lo ayudar a crear un plan de comidas adecuado para usted. Esta informacin no tiene Marine scientist el consejo del mdico. Asegrese de hacerle al mdico cualquier pregunta que tenga. Document Released: 06/18/2007 Document Revised: 04/01/2014 Document Reviewed:  02/05/2013 Elsevier Interactive Patient Education  2017 [place] de la diabetes mellitus tipo2 (Preventing Type 2 Diabetes Mellitus) La diabetes tipo2 (diabetes mellitus tipo2) es una enfermedad a largo plazo (crnica) que afecta los niveles de azcar en la sangre (glucosa). Normalmente, una hormona llamada insulina estimula el ingreso de la glucosa en las clulas del cuerpo. Las clulas usan la glucosa para Dealer. En la diabetes tipo2, puede presentarse uno de los siguientes problemas, o ambos:  El organismo no produce la cantidad suficiente de [place].  El organismo no responde de [place] a la insulina que produce (resistencia a la insulina). La resistencia a la insulina o la falta de esta hormona hace que el exceso de glucosa se acumule en la sangre, en lugar de ir a las clulas. Como consecuencia, se  desarrolla glucemia alta (hiperglucemia), que puede causar muchas complicaciones. El sobrepeso o la obesidad, y Catering manager un estilo de vida inactivo (sedentario) pueden aumentar el riesgo de tener diabetes. La diabetes tipo2 se puede retardar o evitar al realizar ciertos cambios en la alimentacin y en el estilo de vida. QU CAMBIOS EN LA ALIMENTACIN SE PUEDEN HACER?  Consuma comidas y colaciones saludables regularmente. Lleve con usted una colacin saludable para cuando tenga [employer], por [place], una fruta o un puado de frutos secos.  Coma carne [place] y protenas con bajo contenido de grasas saturadas, como pollo, pescado, huevos blancos y frijoles. Evite las carnes procesadas.  Coma mucha fruta y verdura, y Ardelia Mems cantidad importante de cereales no procesados (cereales integrales). Se recomienda que consuma lo siguiente:  De 1 a 2tazas de frutas US [employer]ways.  De 2 a 3tazas de [employer].  De 6onzas (170g) a 8onzas (227g) de cereales integrales todos los [place], como avena, salvado, trigo bulgur, arroz  integral, quinua y mijo.  Productos lcteos con bajo contenido de [place], [place], yogur y [place].  Alimentos que contengan grasas saludables, como frutos secos, Musician, aceite de [place] y aceite de canola.  Beba agua [place]. Evite bebidas que contengan ms azcar, como gaseosas y t [place].  Siga las indicaciones del mdico con respecto a las restricciones especficas para las comidas o bebidas.  Controle la cantidad de comida que consume en un momento dado (tamao de la porcin).  Revise las etiquetas de los alimentos para conocer el tamao de la porcin.  Utilice una balanza de cocina para pesar las cantidades de alimentos.  [place] o cocine al vapor los alimentos en vez de frerlos. Cocine con agua o caldo en vez de aceite o manteca.  Limite la ingesta de lo siguiente:  Sal (sodio). No consuma ms de 1cucharadita (2400mg ) de sodio por da. Si tiene [place] cardiopata o hipertensin arterial, consuma menos de  o de cucharadita (1500mg ) de Electrical engineer.  Grasas saturadas. Es la grasa que se encuentra en estado slido a temperatura ambiente, como la [place] o la grasa de la carne. QU CAMBIOS EN EL ESTILO DE VIDA SE PUEDEN HACER? Actividad   Haga actividad fsica de intensidad moderada durante al menos 100minutos como mnimo 5das por semana, o tanto como le haya indicado el mdico.  Pregntele al mdico qu actividades son seguras para usted. Una combinacin de actividades puede ser la mejor opcin, por ejemplo, caminar, practicar natacin, andar en bicicleta y hacer entrenamiento de fuerza.  Trate de agregar la actividad fsica a Conservation officer, nature. Por ejemplo:  Estacione en lugares que estn ms alejados de lo habitual para poder caminar ms. Por ejemplo, estacione en una esquina alejada del estacionamiento cuando vaya a la oficina o a la tienda de comestibles.  D una caminata durante su hora de almuerzo.  Utilice las Clinical cytogeneticist del ascensor o de las  escaleras mecnicas. Prdida de peso   Baje de peso segn se le indique. El mdico puede determinar cuntos kilos tiene que bajar y [place] a que adelgace de Geographical information systems officer segura.  Si tiene sobrepeso u obesidad, es posible que se le indique bajar, por lo menos del 5% al 7% del Engineer, site. Alcohol y tabaco   Limite el consumo de alcohol a no ms de 1 medida por da si es mujer y no est Music therapist, y 2 medidas por da si es hombre. Una medida equivale a 12onzas de cerveza,  5onzas de vino o 1onzas de bebidas alcohlicas de alta graduacin.  No consuma ningn producto que contenga tabaco, lo que incluye cigarrillos, tabaco de Higher education careers adviser y Psychologist, sport and exercise. Si necesita ayuda para dejar de fumar, consulte al [employer]. [place] con el mdico   Contrlese el nivel sanguneo de glucosa con frecuencia como se lo haya indicado el mdico.  Analice los factores de riesgo y cmo puede reducir el riesgo de tener diabetes.  Hgase las pruebas de [employer] se lo haya indicado el mdico. Puede hacerse pruebas de deteccin de forma peridica, especialmente si presenta ciertos factores de riesgo para la diabetes tipo2.  Haga una cita con un especialista en alimentacin y nutricin (nutricionista certificado). Un nutricionista certificado puede ayudarlo a preparar un plan de alimentacin saludable, y a comprender los tamaos de las porciones y las etiquetas de los alimentos. POR QU ESTOS CAMBIOS SON IMPORTANTES?  Al hacer cambios en el estilo de vida y la alimentacin, es posible prevenir o retardar la diabetes tipo2 y los problemas de salud relacionados.  Puede ser difcil reconocer los signos de la diabetes tipo2. La mejor manera de evitar los posibles daos al organismo es tomar medidas para prevenir la enfermedad antes de presentar sntomas. QU PUEDE SUCEDER SI NO SE REALIZAN CAMBIOS?  Los niveles sanguneos de glucosa pueden seguir aumentando. Es peligroso Systems analyst glucemia alta durante  mucho tiempo. Demasiada glucosa en la sangre puede daar los vasos sanguneos, el corazn, los riones, los nervios y los ojos.  Puede desarrollar prediabetes o diabetes tipo2. La diabetes tipo2 puede producir muchos problemas de salud crnicos y complicaciones, por ejemplo:  Cardiopata.  Ictus.  Ceguera.  Enfermedad renal.  Depresin.  Mala circulacin en los pies y en las piernas, que podra llevar a la extraccin quirrgica (amputacin) en casos graves. DNDE ENCONTRAR ASISTENCIA:  Pdale al mdico que le recomiende a un nutricionista certificado, a Radio broadcast assistant para el cuidado de la diabetes o un programa para Sports coach de [place].  Busque grupos para bajar de peso locales o en lnea.  Inscrbase en un gimnasio, club de preparacin fsica o grupo de actividades al [employer], [place] un club para salir a Writer. DNDE ENCONTRAR MS INFORMACIN: Para obtener ms informacin sobre la diabetes y la prevencin de la diabetes, visite los siguientes sitios web:  Asociacin Americana de la Diabetes (American Diabetes Association, ADA): www.diabetes.[place] Diabetes y las Enfermedades Digestivas y [facility] of Diabetes and Digestive and Kidney Diseases): FindSpin.nl Para obtener ms informacin sobre una alimentacin saludable, visite los siguientes sitios web:  Choose My Plate ([place]Pl[place]), Departamento de Agricultura de EE.UU. (U.S. Department of Agriculture, Engineer, structural): http://wiley-williams.com/  Lock[employer] Dietary Guidelines ([place]autas de Designer, multimedia) de la Oficina de Prevencin de Enfermedades y Promocin de Technical sales engineer (Office of Disease Prevention and Health Promotion, [place]): [email or web address].at Resumen  Puede reducir el riesgo de desarrollar diabetes tipo2 al aumentar la actividad fsica, comer alimentos saludables y Sports coach de [address], segn se le indique.  Hable con el mdico sobre el riesgo de  desarrollar diabetes tipo2. Pregntele [employer]  de sangre o las pruebas de deteccin que deba [place]. Esta informacin no tiene Marine scientist el consejo del mdico. Asegrese de hacerle al mdico cualquier pregunta que tenga. Document Released: 05/02/2015 Document Revised: 05/02/2015 Document Reviewed: 05/02/2015 Elsevier Interactive Patient Education  2017 [place].   -  Diabetes mellitus y actividad fsica (Diabetes Mellitus and Exercise) Hacer actividad fsica habitualmente es importante para el estado de [place] general,  en especial si tiene diabetes (diabetes mellitus). La actividad fsica no solo se reduce a Pharmacist, hospital. Aporta muchos beneficios para la salud, como aumentar la fuerza muscular y la densidad sea, y reducir las grasas corporales y Dealer. Esto mejora el estado fsico, la flexibilidad y la resistencia, y todo ello redunda en un mejor estado de salud general. La actividad fsica tiene beneficios adicionales para los diabticos, entre ellos:  Disminuye el apetito.  Ayuda a bajar y Consulting civil engineer glucemia bajo control.  Baja la presin arterial.  Ayuda a controlar las cantidades de sustancias grasas (lpidos) en la [place], como el colesterol y los triglicridos.  Mejora la respuesta del cuerpo a la insulina (optimizacin de la sensibilidad a la insulina).  Reduce la cantidad de insulina que el cuerpo necesita.  Reduce el riesgo de sufrir cardiopata coronaria de la siguiente forma:  [employer] Corporation de colesterol y triglicridos.  Aumenta los niveles de colesterol bueno.  Disminuye la glucemia. QU ES EL PLAN DE ACTIVIDADES? El mdico o un educador para la diabetes certificado pueden ayudarlo a Engineer, petroleum del tipo y de la frecuencia de actividad fsica (plan de actividades) adecuado para usted. Asegrese de lo siguiente:  Haga por lo menos 118minutos semanales de ejercicios de intensidad moderada o vigorosa. Estos podran ser  caminatas dinmicas, ciclismo o [place].  Haga ejercicios de elongacin y de fortalecimiento, como yoga o levantamiento de pesas, por lo menos 2veces por semana.  Reparta la actividad en al menos 3das de la semana.  Haga algn tipo de actividad fsica [employer].  No deje pasar ms de 2das seguidos sin hacer algn tipo de actividad fsica.  No permanezca inactivo durante ms de 68minutos seguidos. Tmese descansos frecuentes para caminar o estirarse.  Elija un tipo de ejercicio o de actividad que disfrute y establezca objetivos realistas.  Comience lentamente y aumente de [place] gradual la intensidad del ejercicio con el correr del [place]. QU DEBO SABER ACERCA DEL CONTROL DE LA DIABETES?  Contrlese la glucemia antes y despus de ejercitarse.  Si la glucemia supera los 240mg /dl (13,39mmol/l) antes de ejercitarse, hgase un control de la orina para detectar la presencia de cetonas. Si tiene Emerson [employer] orina, no se ejercite hasta que la glucemia se normalice.  Conozca los sntomas de la glucemia baja (hipoglucemia) y aprenda cmo tratarla. El riesgo de tener hipoglucemia [place] durante y despus de hacer actividad fsica. Los sntomas frecuentes de hipoglucemia pueden incluir los siguientes:  [place].  Ansiedad.  Sudoracin y piel hmeda.  Confusin.  Mareos o sensacin de desvanecimiento.  Aumento de la frecuencia cardaca o palpitaciones.  Visin borrosa.  Hormigueo o adormecimiento alrededor Exxon Mobil Corporation, los labios o la [place].  Temblores o sacudidas.  Irritabilidad.  Tenga una colacin de carbohidratos de accin rpida disponible antes, durante y despus de ejercitarse, a fin de evitar o tratar la hipoglucemia.  Evite inyectarse insulina en las zonas del cuerpo que ejercitar. Por ejemplo, evite inyectarse insulina en:  Los brazos, si juega al tenis.  Las piernas, si corre.  Lleve registros de sus hbitos de actividad fsica. Esto puede  ayudarlos a usted y al mdico a Tax adviser de control de la diabetes segn sea necesario. Escriba los siguientes datos:  Los alimentos que consume antes y despus de Field seismologist actividad fsica.  Los niveles de glucosa en la sangre antes y despus de hacer ejercicios.  El tipo y cantidad de [place] fsica que Musician.  Cuando  se prev que la insulina alcance su valor mximo, si [place] insulina. No haga actividad fsica en los momentos en que insulina alcanza su valor mximo.  Cuando comience un ejercicio o una actividad nuevos, trabaje con el mdico para asegurarse de que la actividad sea segura para usted y para Secretary/administrator la [place], los medicamentos o la ingesta de alimentos segn sea necesario.  Beba gran cantidad de agua mientras hace ejercicios para evitar la deshidratacin o los golpes de Freight forwarder. Beba suficiente lquido para Consulting civil engineer orina clara o de color amarillo plido. Esta informacin no tiene Marine scientist el consejo del mdico. Asegrese de hacerle al mdico cualquier pregunta que tenga. Document Released: 03/31/2007 Document Revised: 07/03/2015 Document Reviewed: 08/21/2015 Elsevier Interactive Patient Education  2017 [place] de alimentacin DASH (DASH Eating Plan) DASH es la sigla en ingls de "Enfoques Alimentarios para Detener la Hipertensin". El plan de alimentacin DASH ha demostrado bajar la presin arterial elevada (hipertensin). Los beneficios adicionales para la salud pueden incluir la disminucin del riesgo de diabetes mellitus tipo2, enfermedades cardacas e ictus. Este plan tambin puede ayudar a Horticulturist, commercial. QU DEBO SABER ACERCA DEL PLAN DE ALIMENTACIN DASH? Para el plan de alimentacin DASH, seguir las siguientes pautas generales:  Elija los alimentos que contienen menos de 150 miligramos de sodio por porcin (segn se indica en la etiqueta de los alimentos).  Use hierbas o aderezos sin sal, en lugar de sal de mesa o sal  marina.  Consulte al mdico o farmacutico antes de usar sustitutos de la sal.  Consuma los productos con menor contenido de sodio. Estos productos suelen estar etiquetados como "bajo en sodio" o "sin agregado de sal".  Coma alimentos frescos. No consuma una gran cantidad de alimentos enlatados.  Coma ms verduras, frutas y productos lcteos con bajo contenido de [place].  Elija los cereales integrales. Busque la palabra "integral" en Equities trader de la lista de ingredientes.  Elija el pescado y el pollo o el pavo sin piel ms a menudo que las carnes rojas. Limite el consumo de pescado, carne de ave y carne a 6onzas (170g) por Training and development officer.  Limite el consumo de dulces, postres, azcares y bebidas azucaradas.  Elija las grasas saludables para el corazn.  Consuma ms comida casera y menos de restaurante, de buf y comida rpida.  Limite el consumo de alimentos fritos.  No fra los alimentos. A la hora de cocinarlos, opte por hornearlos, hervirlos, grillarlos y asarlos a Administrator, arts.  Cuando coma en un restaurante, pida que preparen su comida con menos sal o, en lo posible, sin nada de sal. QU ALIMENTOS PUEDO COMER? Pida ayuda a un nutricionista para conocer las necesidades calricas individuales. Cereales  Pan de salvado o integral. Arroz integral. Pastas de salvado o integrales. Quinua, trigo burgol y cereales integrales. Cereales con bajo contenido de sodio. Tortillas de harina de maz o de salvado. Pan de maz integral. Galletas saladas integrales. Galletas con bajo contenido de [place]. Vegetales  Verduras frescas o congeladas (crudas, al vapor, asadas o grilladas). Jugos de tomate y verduras con contenido bajo o reducido de sodio. Pasta y salsa de tomate con contenido bajo o [place]. Verduras enlatadas con bajo contenido de sodio o reducido de sodio. Lambert Mody  Lambert Mody frescas, en conserva (en su jugo natural) o frutas congeladas. Carnes y otros productos con protenas  Carne  de res molida (al 85% o ms [place]), carne de res de animales alimentados con pastos o carne de res  sin la grasa. Pollo o pavo sin piel. Carne de pollo o de [place]. Cerdo sin la grasa. Todos los pescados y frutos de mar. Huevos. Porotos, guisantes o lentejas secos. Frutos secos y semillas sin sal. Frijoles enlatados sin sal. Lcteos  Productos lcteos con bajo contenido de grasas, como [place] o al 1%, quesos reducidos en grasas o al 2%, ricota con bajo contenido de grasas o [employer], o yogur natural con bajo contenido de [place]. Quesos con contenido bajo o reducido de sodio. Grasas y Advertising copywriter en barra que no contengan grasas trans. [place] y alios para ensaladas livianos o reducidos en grasas (reducidos en sodio). [place]. Aceites de crtamo, oliva o canola. [place] natural de man o almendra. Otros  Palomitas de maz y pretzels sin sal. Los artculos mencionados arriba pueden no ser [employer] de las bebidas o los alimentos recomendados. Comunquese con el nutricionista para conocer ms opciones.  QU ALIMENTOS NO SE RECOMIENDAN? Cereales  Pan blanco. Pastas blancas. Arroz blanco. Pan de maz refinado. Bagels y croissants. Galletas saladas que contengan grasas trans. Vegetales  Vegetales con crema o fritos. Verduras en [place]. Verduras enlatadas comunes. Pasta y salsa de tomate en lata comunes. Jugos comunes de tomate y de verduras. Lambert Mody  Fruta enlatada en almbar liviano o espeso. Jugo de frutas. Carnes y otros productos con protenas  [place]ort[place] de carne con Lobbyist. Costillas, alas de pollo, tocineta, salchicha, mortadela, salame, chinchulines, tocino, perros calientes, salchichas alemanas y embutidos envasados. Frutos secos y semillas con sal. Frijoles con sal en lata. Lcteos  Leche entera o al 2%, crema, mezcla de [place] y crema, y queso crema. Yogur entero o endulzado. Quesos o queso azul con alto contenido de Physicist, medical. [place] no lcteas y  coberturas batidas. Quesos procesados, quesos para untar o cuajadas. Condimentos  Sal de cebolla y ajo, sal condimentada, sal de mesa y sal marina. Salsas en lata y envasadas. Salsa [place]. Salsa trtara. Salsa barbacoa. Salsa teriyaki. Salsa de soja, incluso la que tiene contenido reducido de [place]. Salsa de carne. Salsa de pescado. Salsa de [place]. Salsa rosada. Rbano picante. Ketchup y mostaza. Saborizantes y tiernizantes para carne. Caldo en cubitos. Salsa picante. Salsa tabasco. Adobos. Aderezos para tacos. Salsas. Grasas y aceites  [place], [place] en barra, [place] de [place], [place], [place] clarificada y Wendee Copp de tocino. Aceites de coco, de palmiste o de palma. Aderezos comunes para ensalada. Otros  Pickles y [place]. Palomitas de maz y pretzels con sal. Los artculos mencionados arriba pueden no ser [employer] de las bebidas y los alimentos que se Higher education careers adviser. Comunquese con el nutricionista para obtener ms informacin.  DNDE Dolan Amen MS INFORMACIN? [place], del Pulmn y de Herbalist ([employer] Heart, Lung, and [place]): [email or web address].com Esta informacin no tiene Marine scientist el consejo del mdico. Asegrese de hacerle al mdico cualquier pregunta que tenga. Document Released: 02/28/2011 Document Revised: 07/03/2015 Document Reviewed: 01/13/2013 Elsevier Interactive Patient Education  2017 [employer].

## 2016-03-05 ENCOUNTER — Ambulatory Visit: Payer: Self-pay

## 2016-03-05 LAB — HIV ANTIBODY (ROUTINE TESTING W REFLEX): HIV 1&2 Ab, 4th Generation: NONREACTIVE

## 2016-03-05 LAB — MICROALBUMIN / CREATININE URINE RATIO
CREATININE, URINE: 136 mg/dL (ref 20–370)
Microalb Creat Ratio: 10 mcg/mg creat (ref <30)
Microalb, Ur: 1.3 mg/dL

## 2016-03-06 ENCOUNTER — Telehealth: Payer: Self-pay

## 2016-03-06 ENCOUNTER — Other Ambulatory Visit: Payer: Self-pay | Admitting: Internal Medicine

## 2016-03-06 DIAGNOSIS — E119 Type 2 diabetes mellitus without complications: Secondary | ICD-10-CM

## 2016-03-06 NOTE — Telephone Encounter (Signed)
error 

## 2016-03-06 NOTE — Telephone Encounter (Signed)
[employer] Id: [id number] Contacted pt to go over lab results pt is aware and doesn't have any questions or concerns

## 2016-03-07 ENCOUNTER — Other Ambulatory Visit: Payer: Self-pay | Admitting: Pharmacist

## 2016-03-07 MED ORDER — GLUCOSE BLOOD VI STRP: ORAL_STRIP | 3 refills | Status: DC

## 2016-03-08 MED FILL — TRUE METRIX TEST STRIP: 30 days supply | Qty: 100 | Fill #0

## 2016-03-11 ENCOUNTER — Other Ambulatory Visit: Payer: Self-pay | Admitting: Internal Medicine

## 2016-03-20 MED FILL — LISINOPRIL 5 MG TABLET: 5 | 30 days supply | Qty: 30 | Fill #5

## 2016-04-01 MED FILL — PRAVASTATIN NA 20 MG TAB: 20 | 30 days supply | Qty: 30 | Fill #1

## 2016-04-01 MED FILL — TRUEplus LANCETS 28G MISC: 30 days supply | Qty: 100 | Fill #2

## 2016-04-03 MED FILL — TRUE METRIX TEST STRIP: 30 days supply | Qty: 100 | Fill #1

## 2016-04-19 MED FILL — LISINOPRIL 5 MG TABLET: 5 | 30 days supply | Qty: 30 | Fill #6

## 2016-04-30 MED FILL — METFORMIN HCL ER 500 MG TAB: 500 | 30 days supply | Qty: 30 | Fill #1

## 2016-04-30 MED FILL — PRAVASTATIN NA 20 MG TAB: 20 | 30 days supply | Qty: 30 | Fill #2

## 2016-05-20 MED FILL — TRUE METRIX TEST STRIP: 30 days supply | Qty: 100 | Fill #2

## 2016-05-20 MED FILL — ?LISINOPRIL 5 MG TABLET: 5 | 30 days supply | Qty: 30 | Fill #7

## 2016-05-20 MED FILL — TRUEplus LANCETS 28G MISC: 30 days supply | Qty: 100 | Fill #3

## 2016-06-06 MED FILL — PRAVASTATIN NA 20 MG TAB: 20 | 30 days supply | Qty: 30 | Fill #3

## 2016-06-11 ENCOUNTER — Ambulatory Visit: Payer: Self-pay | Attending: Internal Medicine | Admitting: Internal Medicine

## 2016-06-11 ENCOUNTER — Encounter: Payer: Self-pay | Admitting: Internal Medicine

## 2016-06-11 VITALS — BP 141/91 | HR 72 | Temp 98.3°F | Resp 16 | Wt 161.0 lb

## 2016-06-11 DIAGNOSIS — I10 Essential (primary) hypertension: Secondary | ICD-10-CM | POA: Insufficient documentation

## 2016-06-11 DIAGNOSIS — Z7984 Long term (current) use of oral hypoglycemic drugs: Secondary | ICD-10-CM | POA: Insufficient documentation

## 2016-06-11 DIAGNOSIS — Z1321 Encounter for screening for nutritional disorder: Secondary | ICD-10-CM | POA: Insufficient documentation

## 2016-06-11 DIAGNOSIS — Z23 Encounter for immunization: Secondary | ICD-10-CM

## 2016-06-11 DIAGNOSIS — E119 Type 2 diabetes mellitus without complications: Secondary | ICD-10-CM | POA: Insufficient documentation

## 2016-06-11 DIAGNOSIS — Z125 Encounter for screening for malignant neoplasm of prostate: Secondary | ICD-10-CM | POA: Insufficient documentation

## 2016-06-11 LAB — LIPID PANEL
CHOL/HDL RATIO: 4.1 ratio (ref <5.0)
Cholesterol: 136 mg/dL (ref <200)
HDL: 33 mg/dL — AB (ref >40)
LDL Cholesterol: 77 mg/dL (ref <100)
Triglycerides: 132 mg/dL (ref <150)
VLDL: 26 mg/dL (ref <30)

## 2016-06-11 LAB — BASIC METABOLIC PANEL WITH GFR
BUN: 17 mg/dL (ref 7–25)
CALCIUM: 9.6 mg/dL (ref 8.6–10.3)
CO2: 23 mmol/L (ref 20–31)
CREATININE: 0.83 mg/dL (ref 0.60–1.35)
Chloride: 105 mmol/L (ref 98–110)
GFR, Est African American: >89 mL/min (ref >=60)
GFR, Est Non African American: >89 mL/min (ref >=60)
GLUCOSE: 106 mg/dL — AB (ref 65–99)
Potassium: 4.4 mmol/L (ref 3.5–5.3)
SODIUM: 138 mmol/L (ref 135–146)

## 2016-06-11 LAB — GLUCOSE, POCT (MANUAL RESULT ENTRY): POC Glucose: 121 mg/dl — AB (ref 70–99)

## 2016-06-11 MED ORDER — METFORMIN HCL ER 500 MG PO TB24
500.0000 mg | ORAL_TABLET | Freq: Every day | ORAL | 3 refills | Status: DC
Start: 2016-06-11 — End: 2016-06-12

## 2016-06-11 MED ORDER — LISINOPRIL 10 MG PO TABS: 10.0000 mg | ORAL_TABLET | Freq: Every day | ORAL | 3 refills | Status: DC

## 2016-06-11 MED ORDER — PRAVASTATIN SODIUM 20 MG PO TABS: 20.0000 mg | ORAL_TABLET | Freq: Every day | ORAL | 3 refills | Status: DC

## 2016-06-11 MED FILL — LISINOPRIL 10 MG TABLET: 10 | 30 days supply | Qty: 30 | Fill #0

## 2016-06-11 NOTE — Patient Instructions (Addendum)
[name] 2 wks for bp check   Plan de alimentacin con bajo contenido de sodio (Low-Sodium Eating Plan) El sodio aumenta la presin arterial y hace que el cuerpo retenga lquidos. El consumo de alimentos con menos sodio ayuda a Armed forces technical officer presin arterial, a Forensic psychologist y a Tour manager, el hgado y los riones. Agregar sal (cloruro de sodio) a los alimentos aumenta el aporte de [place]. La mayor parte del sodio proviene de los alimentos enlatados, envasados y congelados. La pizza, la comida rpida y la comida de los restaurantes tambin contienen mucho sodio. Aunque usted tome medicamentos para bajar la presin arterial o reducir el lquido del cuerpo, es importante que disminuya el aporte de sodio de los alimentos. EN QU CONSISTE EL PLAN? La [employer] de las personas deberan limitar la ingesta de sodio a 2300mg  por Training and development officer. El mdico le recomienda que limite su consumo de sodio a 000mg  por Training and development officer. QU DEBO SABER ACERCA DE ESTE PLAN DE [place]? Para el plan de alimentacin con bajo contenido de sodio, debe seguir estas pautas generales:  Elija alimentos con un valor porcentual diario de sodio de menos del 5% (segn se indica en la etiqueta).  Use hierbas o aderezos sin sal, en lugar de sal de mesa o sal marina.  Consulte al mdico o farmacutico antes de usar sustitutos de la sal.  Coma alimentos frescos.  Coma ms frutas y verduras.  Limite las verduras enlatadas. Si las consume, enjuguelas bien para disminuir el sodio.  Limite el consumo de queso a 1onza (28g) por Training and development officer.  Coma productos con bajo contenido de sodio, cuya etiqueta suele decir "bajo contenido de sodio" o "sin agregado de sal".  Evite alimentos que contengan glutamato monosdico (MSG), que a veces se agrega a la comida [place] y a algunos alimentos enlatados.  Consulte las etiquetas de los alimentos (etiquetas de informacin nutricional) para saber cunto sodio contiene una porcin.  Consuma ms  comida casera y menos de restaurante, de buf y comida rpida.  Cuando coma en un restaurante, pida que preparen su comida con menos sal o, en lo posible, sin nada de sal. CMO LEO LA INFORMACIN SOBRE EL SODIO EN LAS ETIQUETAS DE LOS ALIMENTOS? La etiqueta de informacin nutricional indica la cantidad de sodio en una porcin de alimento. Si come ms de una porcin, debe multiplicar la cantidad indicada de sodio por la cantidad de porciones. Las etiquetas de los alimentos tambin pueden indicar lo siguiente:  Sin sodio: menos de 5mg  por porcin.  Cantidad muy baja de sodio: 35mg  o menos por porcin.  Cantidad baja de sodio: 140mg  o menos por porcin.  Menor cantidad de sodio: 50% menos de sodio en una porcin. Por ejemplo, si un alimento generalmente contiene 300 mg de sodio se modifica para ser [employer], tendr 150 mg de sodio.  Sodio reducido: 25% menos de Agricultural consultant. Por ejemplo, si un alimento que por lo general contiene 400mg  de sodio se modifica para convertirse en un alimento de sodio reducido, tendr 300mg  de sodio. QU ALIMENTOS PUEDO COMER? Cereales Cereales con bajo contenido de sodio, como [place], arroz y trigo [place], y trigo triturado. Galletas con bajo contenido de [place]. Arroz y pastas sin sal. Pan con bajo contenido de [place]. Verduras Verduras frescas o congeladas. Verduras enlatadas con bajo contenido de sodio o reducido de sodio. Pasta y salsa de tomate con contenido bajo o [place]. Jugos de tomate y verduras con contenido bajo o reducido  de sodio. Lambert Mody Frutas frescas, congeladas y IT sales professional. Jugo de frutas. Carnes y otros productos con protenas Atn y salmn enlatado con bajo contenido de [place]. Carne de vaca o ave, pescado y frutos de mar frescos o congelados. Cordero. Frutos secos sin sal. Lentejas, frijoles y guisantes secos, sin sal agregada. Frijoles enlatados sin sal. Sopas caseras sin sal. Huevos. Lcteos Leche.  Leche de soja. Queso ricota. Quesos con contenido bajo o reducido de sodio. Yogur. Condimentos Hierbas y especias frescas y secas. Aderezos sin sal. Cebolla y ajo en polvo. Variedades de [place] y ketchup con bajo contenido de sodio. Rbano picante fresco o refrigerado. Jugo de limn. Grasas y aceites Aderezos para ensalada con contenido reducido de [place]. Mantequilla sin sal. Otros Palomitas de maz y pretzels sin sal. Los artculos mencionados arriba pueden no ser [employer] de las bebidas o los alimentos recomendados. Comunquese con el nutricionista para conocer ms opciones. QU ALIMENTOS NO SE RECOMIENDAN? Cereales Cereales instantneos para comer caliente. Mezclas para bizcochos, panqueques y rellenos de pan. Crutones. Mezclas para pastas o arroz con condimento. Envases comerciales de sopa de fideos. Macarrones con queso envasados o congelados. Harina leudante. Galletas saladas comunes. Verduras Verduras enlatadas comunes. Pasta y salsa de tomate en lata comunes. Jugos comunes de tomate y de verduras. Verduras Surveyor, minerals. Papas fritas saladas. Aceitunas. Pepinillos. Salsas. Chucrut. Salsa. Carnes y otros productos con protenas Carne de vaca, pescado o frutos de mar que est salada, [place], [place], condimentada con especias o con pickles. Panceta, jamn, salchichas, perros calientes, carne curada, carne picada (carne envasada de buey) y embutidos. Cerdo salado. Cecina o charqui. Arenque en escabeche. Anchoas, atn enlatado comn y sardinas. Frutos secos con sal. Ines Bloomer para untar y quesos procesados. Requesn. Queso azul y cottage. Suero de [place]. Condimentos Sal de cebolla y ajo, sal condimentada, sal de mesa y sal marina. Salsas en lata y envasadas. Salsa [place]. Salsa trtara. Salsa barbacoa. Salsa teriyaki. Salsa de soja, incluso la que tiene contenido reducido de [place]. Salsa de carne. Salsa de pescado. Salsa de [place]. Salsa rosada. Rbano picante  envasado. Ketchup y mostaza comunes. Saborizantes y tiernizantes para carne. Caldo en cubitos. Salsa picante. Salsa tabasco. Adobos. Aderezos para tacos. Salsas. Grasas y aceites Aderezos comunes para ensalada. [place] con sal. Margarina. Mantequilla clarificada. Grasa de panceta. Otros Nachos y papas fritas envasadas. Maz inflado y frituras de maz. Palomitas de maz y pretzels con sal. Sopas enlatadas o en polvo. Pizza. Pasteles y entradas congeladas. Los artculos mencionados arriba pueden no ser [employer] de las bebidas y los alimentos que se Higher education careers adviser. Comunquese con el nutricionista para obtener ms informacin. Esta informacin no tiene Marine scientist el consejo del mdico. Asegrese de hacerle al mdico cualquier pregunta que tenga. Document Released: 03/11/2005 Document Revised: 04/01/2014 Document Reviewed: 01/13/2013 Elsevier Interactive Patient Education  2017 [place] de la diabetes mellitus tipo2 (Preventing Type 2 Diabetes Mellitus) La diabetes tipo2 (diabetes mellitus tipo2) es una enfermedad a largo plazo (crnica) que afecta los niveles de azcar en la sangre (glucosa). Normalmente, una hormona llamada insulina estimula el ingreso de la glucosa en las clulas del cuerpo. Las clulas usan la glucosa para Dealer. En la diabetes tipo2, puede presentarse uno de los siguientes problemas, o ambos:  El organismo no produce la cantidad suficiente de [place].  El organismo no responde de [place] a la insulina que produce (resistencia a la insulina). La resistencia a la insulina o la falta de  esta hormona hace que el exceso de glucosa se acumule en la sangre, en lugar de ir a las clulas. Como consecuencia, se desarrolla glucemia alta (hiperglucemia), que puede causar muchas complicaciones. El sobrepeso o la obesidad, y Catering manager un estilo de vida inactivo (sedentario) pueden aumentar el riesgo de tener diabetes. La diabetes  tipo2 se puede retardar o evitar al realizar ciertos cambios en la alimentacin y en el estilo de vida. QU CAMBIOS EN LA ALIMENTACIN SE PUEDEN HACER?  Consuma comidas y colaciones saludables regularmente. Lleve con usted una colacin saludable para cuando tenga [employer], por [place], una fruta o un puado de frutos secos.  Coma carne [place] y protenas con bajo contenido de grasas saturadas, como pollo, pescado, huevos blancos y frijoles. Evite las carnes procesadas.  Coma mucha fruta y verdura, y Ardelia Mems cantidad importante de cereales no procesados (cereales integrales). Se recomienda que consuma lo siguiente:  De 1 a 2tazas de frutas [employer].  De 2 a 3tazas de [employer].  De 6onzas (170g) a 8onzas (227g) de cereales integrales todos los [place], como avena, salvado, trigo bulgur, arroz integral, quinua y mijo.  Productos lcteos con bajo contenido de [place], [place], yogur y [place].  Alimentos que contengan grasas saludables, como frutos secos, Musician, aceite de [place] y aceite de canola.  Beba agua [place]. Evite bebidas que contengan ms azcar, como gaseosas y t [place].  Siga las indicaciones del mdico con respecto a las restricciones especficas para las comidas o bebidas.  Controle la cantidad de comida que consume en un momento dado (tamao de la porcin).  Revise las etiquetas de los alimentos para conocer el tamao de la porcin.  Utilice una balanza de cocina para pesar las cantidades de alimentos.  Saltee o cocine al vapor los alimentos en vez de frerlos. Cocine con agua o caldo en vez de aceite o manteca.  Limite la ingesta de lo siguiente:  Sal (sodio). No consuma ms de 1cucharadita (2400mg ) de Electrical engineer. Si tiene [place] cardiopata o hipertensin arterial, consuma menos de  o de cucharadita (1500mg ) de sodio por da.  Grasas saturadas. Es la grasa que se encuentra en estado slido a temperatura  ambiente, como la [place] o la grasa de la carne. QU CAMBIOS EN EL ESTILO DE VIDA SE PUEDEN HACER? Actividad  Haga actividad fsica de intensidad moderada durante al menos 64minutos como mnimo 5das por semana, o tanto como le haya indicado el mdico.  Pregntele al mdico qu actividades son seguras para usted. Una combinacin de actividades puede ser la mejor opcin, por ejemplo, caminar, practicar natacin, andar en bicicleta y hacer entrenamiento de fuerza.  Trate de agregar la actividad fsica a Conservation officer, nature. Por ejemplo:  Estacione en lugares que estn ms alejados de lo habitual para poder caminar ms. Por ejemplo, estacione en una esquina alejada del estacionamiento cuando vaya a la oficina o a la tienda de comestibles.  D una caminata durante su hora de almuerzo.  Utilice las Clinical cytogeneticist del ascensor o de las escaleras mecnicas. Prdida de peso  Baje de peso segn se le indique. El mdico puede determinar cuntos kilos tiene que bajar y [place] a que adelgace de Geographical information systems officer segura.  Si tiene sobrepeso u obesidad, es posible que se le indique bajar, por lo menos del 5% al 7% del Engineer, site. Alcohol y tabaco  Limite el consumo de alcohol a no ms de 1 medida por da si es  mujer y no est embarazada, y 2 medidas por da si es hombre. Una medida equivale a 12onzas de cerveza, 5onzas de vino o 1onzas de bebidas alcohlicas de alta graduacin.  No consuma ningn producto que contenga tabaco, lo que incluye cigarrillos, tabaco de Higher education careers adviser y Psychologist, sport and exercise. Si necesita ayuda para dejar de fumar, consulte al [employer]. [place] con el mdico  Contrlese el nivel sanguneo de glucosa con frecuencia como se lo haya indicado el mdico.  Analice los factores de riesgo y cmo puede reducir el riesgo de tener diabetes.  Hgase las pruebas de [employer] se lo haya indicado el mdico. Puede hacerse pruebas de deteccin de forma peridica, especialmente si presenta ciertos  factores de riesgo para la diabetes tipo2.  Haga una cita con un especialista en alimentacin y nutricin (nutricionista certificado). Un nutricionista certificado puede ayudarlo a preparar un plan de alimentacin saludable, y a comprender los tamaos de las porciones y las etiquetas de los alimentos. POR QU ESTOS CAMBIOS SON IMPORTANTES?  Al hacer cambios en el estilo de vida y la alimentacin, es posible prevenir o retardar la diabetes tipo2 y los problemas de salud relacionados.  Puede ser difcil reconocer los signos de la diabetes tipo2. La mejor manera de evitar los posibles daos al organismo es tomar medidas para prevenir la enfermedad antes de presentar sntomas. QU PUEDE SUCEDER SI NO SE REALIZAN CAMBIOS?  Los niveles sanguneos de glucosa pueden seguir aumentando. Es peligroso Systems analyst glucemia alta durante mucho tiempo. Demasiada glucosa en la sangre puede daar los vasos sanguneos, el corazn, los riones, los nervios y los ojos.  Puede desarrollar prediabetes o diabetes tipo2. La diabetes tipo2 puede producir muchos problemas de salud crnicos y complicaciones, por ejemplo:  Cardiopata.  Ictus.  Ceguera.  Enfermedad renal.  Depresin.  Mala circulacin en los pies y en las piernas, que podra llevar a la extraccin quirrgica (amputacin) en casos graves. DNDE ENCONTRAR ASISTENCIA:  Pdale al mdico que le recomiende a un nutricionista certificado, a Radio broadcast assistant para el cuidado de la diabetes o un programa para Sports coach de [place].  Busque grupos para bajar de peso locales o en lnea.  Inscrbase en un gimnasio, club de preparacin fsica o grupo de actividades al [employer], [place] un club para salir a Writer. DNDE ENCONTRAR MS INFORMACIN: Para obtener ms informacin sobre la diabetes y la prevencin de la diabetes, visite los siguientes sitios web:  Asociacin Americana de la Diabetes (American Diabetes Association, ADA): www.diabetes.[place] Diabetes y las Enfermedades Digestivas y [facility] of Diabetes and Digestive and Kidney Diseases): FindSpin.nl Para obtener ms informacin sobre una alimentacin saludable, visite los siguientes sitios web:  Choose My Plate (MiPlato), Departamento de Agricultura de EE.UU. (U.S. Department of Agriculture, Engineer, structural): http://wiley-williams.com/  [employer] Dietary Guidelines (Pautas de Designer, multimedia) de la Oficina de Prevencin de Enfermedades y Promocin de Technical sales engineer (Office of Disease Prevention and Health Promotion, [place]): SurferLive.at Resumen  Puede reducir el riesgo de desarrollar diabetes tipo2 al aumentar la actividad fsica, comer alimentos saludables y Sports coach de Gas, segn se le indique.  Hable con el mdico sobre el riesgo de desarrollar diabetes tipo2. Pregntele [employer]  de sangre o las pruebas de deteccin que deba [place]. Esta informacin no tiene Marine scientist el consejo del mdico. Asegrese de hacerle al mdico cualquier pregunta que tenga. Document Released: 05/02/2015 Document Revised: 05/02/2015 Document Reviewed: 05/02/2015 Elsevier Interactive Patient Education  2017 [place] Td (contra la  difteria y el ttanos): Lo que debe saber (Td Vaccine [Tetanus and Diphtheria]: What You Need to Know) 1. Por qu vacunarse? El ttanos y la difteria son enfermedades muy graves. Son Futures trader frecuentes en los [place] actualmente, pero las personas que se infectan suelen tener complicaciones graves. La vacuna Td se [place] para proteger a los adolescentes y a los adultos de ambas enfermedades. Tanto el ttanos como la difteria son infecciones causadas por bacterias. La difteria se transmite de persona a persona a travs de la tos o el estornudo. La bacteria que causa el ttanos entra al cuerpo a travs de cortes, raspones o heridas. El TTANOS (trismo) provoca  entumecimiento y Writer dolorosa de los msculos, por lo general, en todo el cuerpo.  Puede causar el endurecimiento de los msculos de la cabeza y el cuello, de modo que impide abrir la boca, tragar y en algunos casos, Ambulance person. El ttanos es causa de muerte en aproximadamente 1de cada 10personas que contraen la infeccin, incluso despus de que reciben la mejor atencin mdica. La DIFTERIA puede hacer que se forme una membrana gruesa en la parte posterior de la garganta.  Puede causar problemas respiratorios, parlisis, insuficiencia cardaca e incluso la muerte. Antes de las vacunas, en los [place] se informaban 200000 casos de difteria y cientos de casos de ttanos cada ao. Desde que comenz la vacunacin, los informes de casos de ambas enfermedades se han reducido en un 99%. 2. Edward Jolly Td La vacuna Td protege a adolescentes y adultos contra el ttanos y la difteria. La vacuna Td habitualmente se aplica como dosis de refuerzo cada 10aos, pero tambin puede administrarse antes si la persona sufre una [place] o herida sucia y grave. A veces, en lugar de la vacuna Td, se recomienda una vacuna llamada Tdap, que protege contra la tosferina, adems de proteger contra el ttanos y la difteria. El mdico o la persona que le aplique la vacuna puede darle ms informacin al [employer] Corporation. La Td puede administrarse de manera segura simultneamente con otras vacunas. 3. Algunas personas no deben recibir la vacuna  Una persona que alguna vez ha tenido una reaccin alrgica potencialmente mortal a una dosis anterior de cualquier vacuna contra el ttanos o la difteria, O que tenga una alergia grave a cualquier parte de esta vacuna, no debe recibir la vacuna Td. Informe a la persona que le aplica la vacuna si usted tiene cualquier alergia grave.  Consulte con su mdico si:  tuvo hinchazn o dolor intenso despus de recibir cualquier vacuna contra la difteria o el ttanos,  alguna vez ha  sufrido el sndrome de [place],  no se siente Pharmacologist en que se ha programado la vacuna. 4. [place] de [place] reaccin a la vacuna Con cualquier medicamento, incluyendo las vacunas, existe la posibilidad de que aparezcan efectos secundarios. Suelen ser leves y desaparecen por s solos. Tambin son posibles las reacciones graves, pero en raras ocasiones. Collegedale personas a las que se les aplica la vacuna Td no tienen ningn problema. Problemas leves despus de la vacuna Td: (No interfirieron en otras actividades)  Dolor en el lugar donde se aplic la vacuna (alrededor de 8de cada 10personas)  Enrojecimiento o hinchazn en el lugar donde se aplic la vacuna (alrededor de 1de cada 4personas)  Fiebre leve (poco frecuente)  Dolor de Pensions consultant (alrededor de 1de cada 4personas)  Cansancio (alrededor de 1de cada 4personas) Problemas moderados despus de la vacuna Td: (Interfirieron en otras actividades, pero no requirieron atencin  mdica)  Fiebre superior a 102F (38,8C) (poco frecuente) Problemas graves despus de la vacuna Td: (Impidieron Optometrist las [employer]; requirieron atencin mdica)  Clinical cytogeneticist, dolor intenso, sangrado o enrojecimiento en el brazo en que se aplic la vacuna (poco frecuente). Problemas que podran ocurrir despus de cualquier vacuna:  Las personas a veces se desmayan despus de un procedimiento mdico, incluida la vacunacin. Si permanece sentado o recostado durante 15 minutos puede ayudar a [employer] y las lesiones causadas por las cadas. Informe al mdico si se siente mareado, tiene cambios en la visin o zumbidos en los odos.  Algunas personas sienten un dolor intenso en el hombro y tienen dificultad para mover el brazo donde se coloc la vacuna. Esto sucede con muy poca frecuencia.  Cualquier medicamento puede causar una reaccin alrgica grave. Dichas reacciones son Orlene Erm poco frecuentes con una vacuna (se calcula  que menos de 1en un milln de dosis) y se producen de unos minutos a unas horas despus de Writer. Al igual que con cualquier [employer], existe una probabilidad muy remota de que una vacuna cause una lesin grave o la [place]. Se controla permanentemente la seguridad de las vacunas. Para obtener ms informacin, visite: http://www.aguilar.org/. 5. Qu pasa si hay una reaccin grave? A qu signos debo estar atento?  Observe todo lo que le preocupe, como signos de una reaccin alrgica grave, fiebre muy alta o comportamiento fuera de lo normal. Los signos de una reaccin alrgica grave pueden incluir ronchas, hinchazn de la cara y la garganta, dificultad para respirar, latidos cardacos acelerados, mareos y debilidad. Generalmente, estos comenzaran entre unos pocos minutos y algunas horas despus de la vacunacin. Qu debo hacer?  Si usted piensa que se trata de una reaccin alrgica grave o de otra emergencia que no puede esperar, llame al 911 o dirjase al hospital ms cercano. Sino, llame a su mdico.  Despus, la reaccin debe informarse al Sistema de Informacin sobre Efectos Adversos de las [place] (Vaccine Adverse Event Reporting System, VAERS). Su mdico puede presentar este informe, o puede hacerlo usted mismo a travs del sitio web de VAERS, en www.[email or web address].es, o llamando al ([phone]. VAERS no brinda recomendaciones mdicas. 6. [place] Compensacin de Daos por [place] de Compensacin de Daos por Clinical biochemist (National Vaccine Injury Compensation Program, VICP) es un programa federal que fue creado para Patent examiner a las personas que puedan haber sufrido daos al recibir ciertas vacunas. Aquellas personas que consideren que han sufrido un dao como consecuencia de una vacuna y [place] saber ms acerca del programa y de cmo presentar Raechel Ch[place], pueden llamar al [phone] o visitar su sitio web en  GoldCloset.com.ee. Hay un lmite de tiempo para presentar un reclamo de compensacin. 7. Cmo puedo obtener ms informacin?  Consulte a su mdico. Este puede darle el prospecto de la vacuna o recomendarle otras fuentes de informacin.  Comunquese con el servicio de salud de su localidad o su estado.  Comunquese con los Centros para Building surveyor y la Prevencin de Probation officer for Disease Control and Prevention , CDC).  Llame al [phone] (1-800-CDC-INFO).  Visite el sitio Biomedical engineer en http://hunter.com/. Declaracin de informacin sobre la vacuna contra la difteria y el ttanos (Td) de los CDC (07/04/15) Esta informacin no tiene Marine scientist el consejo del mdico. Asegrese de hacerle al mdico cualquier pregunta que tenga. Document Released: 06/27/2008 Document Revised: 04/01/2014 Document Reviewed: 07/04/2015 Elsevier Interactive Patient Education  2017 [employer].

## 2016-06-11 NOTE — Progress Notes (Signed)
Billy Gregory, is a 36 y.o. male  HGD:[id number]  DQQ:[id number]  DOB - 1980-09-17  Chief Complaint  Patient presents with  . Diabetes        Subjective:   Billy Gregory is a 36 y.o. male here today for a follow up visit for htn and predm. Of note, he is doing well and has not c/o. He mentions he has been taking his metformin 1/2 tab daily due to it making him more hungry.    Denies tob/etoh.   He notes his bp was up about 1 month ago  136/76, in afternoon.  He has been watching his diet more, eating less salt.  Patient has No headache, No chest pain, No abdominal pain - No Nausea, No new weakness tingling or numbness, No Cough - SOB.  Here w/ his wife and young dgt.  Interpreter was used to communicate directly with patient for the entire encounter including providing detailed patient instructions.   No problems updated.  ALLERGIES: No Known Allergies  PAST MEDICAL HISTORY: Past Medical History:  Diagnosis Date  . Diabetes mellitus without complication ([place])   . Hypertension     MEDICATIONS AT HOME: Prior to Admission medications   Medication Sig Start Date End Date Taking? Authorizing Provider  acetaminophen (TYLENOL) 325 MG tablet Take 650 mg by mouth every 6 (six) hours as needed.    Historical Provider, MD  Blood Glucose Monitoring Suppl (TRUE METRIX METER) w/Device KIT USE AS INSTRUCTED 11/01/15   Tresa Garter, MD  glucose blood (TRUE METRIX BLOOD GLUCOSE TEST) test strip Test your blood sugar daily in the morning or when feeling badly 03/07/16   Maren Reamer, MD  lisinopril (PRINIVIL,ZESTRIL) 10 MG tablet Take 1 tablet (10 mg total) by mouth daily. 06/11/16   Maren Reamer, MD  metFORMIN (GLUCOPHAGE XR) 500 MG 24 hr tablet Take 1 tablet (500 mg total) by mouth daily with breakfast. 06/11/16   Maren Reamer, MD  pravastatin (PRAVACHOL) 20 MG tablet Take 1 tablet (20 mg total) by mouth daily. 06/11/16   Maren Reamer, MD    terbinafine (LAMISIL) 250 MG tablet Take 1 tablet (250 mg total) by mouth daily. Patient not taking: Reported on 03/04/2016 09/07/14   Gean Birchwood, DPM  TRUEPLUS LANCETS 28G MISC USE TO CHECK BLOOD SUGARS 3 TIMES DAILY 08/14/15   Tresa Garter, MD     Objective:   Vitals:   06/11/16 1421  BP: (!) 141/91  Pulse: 72  Resp: 16  Temp: 98.3 F (36.8 C)  TempSrc: Oral  SpO2: 98%  Weight: 161 lb (73 kg)    Exam General appearance : Awake, alert, not in any distress. Speech Clear. Not toxic looking, pleasant. HEENT: Atraumatic and Normocephalic, pupils equally reactive to light. Neck: supple, no JVD. No cervical lymphadenopathy.  Chest:Good air entry bilaterally, no added sounds. CVS: S1 S2 regular, no murmurs/gallups or rubs. Abdomen: Bowel sounds active, Non tender and not distended with no gaurding, rigidity or rebound. Extremities: B/L Lower Ext shows no edema, both legs are warm to touch Neurology: Awake alert, and oriented X 3, CN II-XII grossly intact, Non focal Skin:No Rash  Data Review Lab Results  Component Value Date   HGBA1C 5.8 03/04/2016   HGBA1C 5.4 06/23/2015   HGBA1C 5.80 04/11/2015    Depression screen PHQ 2/9 06/11/2016 03/04/2016 09/19/2015 06/23/2015 04/11/2015  Decreased Interest 0 0 0 0 0  Down, Depressed, Hopeless 0 0 0 0 0  PHQ -  2 Score 0 0 0 0 0  Altered sleeping - - - - -  Tired, decreased energy - - - - -  Change in appetite - - - - -  Feeling bad or failure about yourself  - - - - -  Trouble concentrating - - - - -  Moving slowly or fidgety/restless - - - - -  Suicidal thoughts - - - - -  PHQ-9 Score - - - - -      Assessment & Plan   1. Type 2 diabetes mellitus without complication, without long-term current use of insulin (HCC)  - in prediabetes range - taking 1/2 tab metformin qd currently due to increase appetite/hunger w/ full tab. - POCT glucose (manual entry) - Lipid Panel - BASIC METABOLIC PANEL WITH GFR  2.  Essential hypertension Uncontrolled. Encourage low salt diet, info discussed. - increased lisinopril 5 mg to 30m qd - repeat bp in 2 wks w/ TCarilyn Goodpasture- if sbp >130, than dc lisinopril 10 and start prinzide 10-12.5 qd.   3. Encounter for vitamin deficiency screening - VITAMIN D 25 Hydroxy (Vit-D Deficiency, Fractures)  4. Prostate cancer screening - PSA     Patient have been counseled extensively about nutrition and exercise  Return in about 3 months (around 09/11/2016), or if symptoms worsen or fail to improve.  The patient was given clear instructions to go to ER or return to medical center if symptoms don't improve, worsen or new problems develop. The patient verbalized understanding. The patient was told to call to get lab results if they haven't heard anything in the next week.   This note has been created with DSurveyor, quantity Any transcriptional errors are unintentional.   DMaren Reamer MD, [facility] [place]  06/11/2016, 2:31 PM

## 2016-06-12 ENCOUNTER — Other Ambulatory Visit: Payer: Self-pay | Admitting: Internal Medicine

## 2016-06-12 LAB — HEMOGLOBIN A1C
Hgb A1c MFr Bld: 5.3 % (ref <5.7)
Mean Plasma Glucose: 105 mg/dL

## 2016-06-12 LAB — PSA: PSA: 0.6 ng/mL (ref <=4.0)

## 2016-06-12 LAB — VITAMIN D 25 HYDROXY (VIT D DEFICIENCY, FRACTURES): Vit D, 25-Hydroxy: 21 ng/mL — ABNORMAL LOW (ref 30–100)

## 2016-06-12 MED ORDER — METFORMIN HCL ER 500 MG PO TB24: 250.0000 mg | ORAL_TABLET | ORAL | 3 refills | Status: DC

## 2016-06-13 ENCOUNTER — Telehealth: Payer: Self-pay

## 2016-06-13 ENCOUNTER — Other Ambulatory Visit: Payer: Self-pay | Admitting: Pharmacist

## 2016-06-13 MED ORDER — METFORMIN HCL 500 MG PO TABS: 250.0000 mg | ORAL_TABLET | Freq: Every day | ORAL | 0 refills | Status: DC

## 2016-06-13 MED FILL — metFORMIN HCL 500 MG TABS: 500 | 30 days supply | Qty: 15 | Fill #0

## 2016-06-13 NOTE — Telephone Encounter (Signed)
-----   Message from Maren Reamer, MD sent at 06/12/2016  8:40 AM EDT ----- No diabetes or prediabetes by labs now.  He can take 1/2 tab of metformin every other day.  Great job on diet! Vit D low. Can take otc vitamin d 5,000 IU daily to increase it.    Vit D very low. Very low vit d, can cause bone/muscle pain.  Prostate cancer screening normal/neg, cholesterol looks great. Since he is not even prediabetic any more, he can STOP the cholesterol med/Pravastatin for now - keep up w/ his healthy diet!  Kidney looks great. thanks

## 2016-06-13 NOTE — Telephone Encounter (Signed)
CMA call to go over lab results  Patient Verify DOB  Patient was aware and understood   

## 2016-06-25 ENCOUNTER — Ambulatory Visit: Payer: Self-pay | Attending: Internal Medicine | Admitting: *Deleted

## 2016-06-25 VITALS — BP 142/88 | HR 87

## 2016-06-25 DIAGNOSIS — I10 Essential (primary) hypertension: Secondary | ICD-10-CM

## 2016-06-25 MED ORDER — LISINOPRIL-HYDROCHLOROTHIAZIDE 10-12.5 MG PO TABS: 1.0000 | ORAL_TABLET | Freq: Every day | ORAL | 3 refills | Status: DC

## 2016-06-25 MED FILL — LISINOPRIL-HCTZ 10-12.5 MG: 10-12.5 | 30 days supply | Qty: 30 | Fill #0

## 2016-06-25 NOTE — Addendum Note (Signed)
Addended by: Carilyn Goodpasture on: 06/25/2016 05:30 PM   Modules accepted: Orders

## 2016-06-25 NOTE — Progress Notes (Signed)
Pt here for f/u BP check after office visit. Pt denies chest pain, SOB, HA, new vison concerns, or generalized swelling.  Pt states he has been taking medication as prescribed Blood pressure taken while patient is sitting. BP 143/87 and 142/88.    Medication adjustments per Dr. Janne Napoleon made. Pt aware to: -d/c Lisinopril  - begin Prinzide 10-12.5 mg.  Pt  verbalized understanding. Interpreter assistance used. Nurse visit will be routed to provider.Carilyn Goodpasture, RN, BSN

## 2016-06-25 NOTE — Patient Instructions (Addendum)
Stop Lisinopril  Start taking Prinzide 10-12.5 mg. Pick up from pharmacy at [facility]

## 2016-07-09 MED FILL — METFORMIN HCL ER 500 MG TAB: 500 | 30 days supply | Qty: 30 | Fill #2

## 2016-07-09 MED FILL — TRUE METRIX TEST STRIP: 30 days supply | Qty: 100 | Fill #3

## 2016-07-18 ENCOUNTER — Other Ambulatory Visit: Payer: Self-pay | Admitting: Internal Medicine

## 2016-07-18 MED FILL — LISINOPRIL-HCTZ 10-12.5 MG: 10-12.5 | 30 days supply | Qty: 30 | Fill #1

## 2016-07-19 MED FILL — TRUEplus LANCETS 28G MISC: 33 days supply | Qty: 100 | Fill #0

## 2016-08-08 ENCOUNTER — Encounter: Payer: Self-pay | Admitting: Internal Medicine

## 2016-08-09 ENCOUNTER — Encounter: Payer: Self-pay | Admitting: Internal Medicine

## 2016-08-12 ENCOUNTER — Encounter: Payer: Self-pay | Admitting: Internal Medicine

## 2016-08-20 MED FILL — LISINOPRIL-HCTZ 10-12.5 MG: 10-12.5 | 30 days supply | Qty: 30 | Fill #2

## 2016-08-22 ENCOUNTER — Other Ambulatory Visit: Payer: Self-pay | Admitting: Internal Medicine

## 2016-08-22 MED FILL — TRUEplus LANCETS 28G MISC: 33 days supply | Qty: 100 | Fill #1

## 2016-08-26 ENCOUNTER — Other Ambulatory Visit: Payer: Self-pay | Admitting: Pharmacist

## 2016-08-26 MED ORDER — GLUCOSE BLOOD VI STRP: ORAL_STRIP | 3 refills | Status: DC

## 2016-08-26 MED FILL — TRUE METRIX TEST STRIP: 25 days supply | Qty: 100 | Fill #0

## 2016-09-13 ENCOUNTER — Ambulatory Visit: Payer: Self-pay | Attending: Family Medicine | Admitting: Family Medicine

## 2016-09-13 ENCOUNTER — Encounter: Payer: Self-pay | Admitting: Family Medicine

## 2016-09-13 VITALS — BP 153/77 | HR 98 | Temp 97.8°F | Wt 159.6 lb

## 2016-09-13 DIAGNOSIS — B432 Subcutaneous pheomycotic abscess and cyst: Secondary | ICD-10-CM | POA: Insufficient documentation

## 2016-09-13 DIAGNOSIS — L84 Corns and callosities: Secondary | ICD-10-CM

## 2016-09-13 DIAGNOSIS — E119 Type 2 diabetes mellitus without complications: Secondary | ICD-10-CM

## 2016-09-13 DIAGNOSIS — L729 Follicular cyst of the skin and subcutaneous tissue, unspecified: Secondary | ICD-10-CM

## 2016-09-13 DIAGNOSIS — Z7984 Long term (current) use of oral hypoglycemic drugs: Secondary | ICD-10-CM | POA: Insufficient documentation

## 2016-09-13 DIAGNOSIS — I10 Essential (primary) hypertension: Secondary | ICD-10-CM

## 2016-09-13 LAB — GLUCOSE, POCT (MANUAL RESULT ENTRY): POC GLUCOSE: 192 mg/dL — AB (ref 70–99)

## 2016-09-13 MED ORDER — METFORMIN HCL 500 MG PO TABS: 250.0000 mg | ORAL_TABLET | Freq: Every day | ORAL | 3 refills | Status: DC

## 2016-09-13 MED ORDER — LISINOPRIL-HYDROCHLOROTHIAZIDE 20-25 MG PO TABS: 1.0000 | ORAL_TABLET | Freq: Every day | ORAL | 3 refills | Status: DC

## 2016-09-13 MED FILL — ?METFORMIN HCL 500MG TABLET: 500 | 30 days supply | Qty: 15 | Fill #0

## 2016-09-13 MED FILL — LISINOPRIL-HCTZ 20-25 MG TA: 20-25 | 30 days supply | Qty: 30 | Fill #0

## 2016-09-13 NOTE — Patient Instructions (Addendum)

## 2016-09-13 NOTE — Progress Notes (Signed)
Subjective:  Patient ID: Billy Gregory, male    DOB: [birth year]  Age: 36 y.o. MRN: [id number]  CC: Hypertension   HPI Billy Gregory is a 36 year old male with a history of hypertension, type 2 diabetes mellitus (A1c 5.3) who presents today to establish care with me. He was previously followed by Dr. Janne Napoleon who is no longer with the practice.  He has been compliant with his Hypertensive but his blood pressure is elevated today. He is also compliant with a low-sodium diet and diabetic diet.  He takes his metformin 250 mg every other day and denies any hypoglycemic episodes. Denies visual complaints of numbness in extremities.  He does have a skin lump in his left upper arm which he has had for years and denies increase in size or tenderness. He has also noticed small calluses on his feet which occurred when he wore shoes that were too tight but have improved ever since he got larger sized shoes.  Past Medical History:  Diagnosis Date  . Diabetes mellitus without complication ([place])   . Hypertension     No past surgical history on file.  No Known Allergies   Outpatient Medications Prior to Visit  Medication Sig Dispense Refill  . acetaminophen (TYLENOL) 325 MG tablet Take 650 mg by mouth every 6 (six) hours as needed.    . Blood Glucose Monitoring Suppl (TRUE METRIX METER) w/Device KIT USE AS INSTRUCTED 1 kit 0  . glucose blood (TRUE METRIX BLOOD GLUCOSE TEST) test strip Test your blood sugar daily in the morning or when feeling badly 100 each 3  . TRUEPLUS LANCETS 28G MISC USE TO CHECK BLOOD SUGARS 3 TIMES DAILY 100 each 3  . lisinopril-hydrochlorothiazide (PRINZIDE,ZESTORETIC) 10-12.5 MG tablet Take 1 tablet by mouth daily. 90 tablet 3  . metFORMIN (GLUCOPHAGE) 500 MG tablet Take 0.5 tablets (250 mg total) by mouth daily. 30 tablet 0  . terbinafine (LAMISIL) 250 MG tablet Take 1 tablet (250 mg total) by mouth daily. (Patient not taking: Reported on 09/13/2016) 30  tablet 2   No facility-administered medications prior to visit.     ROS Review of Systems  Constitutional: Negative for activity change and appetite change.  HENT: Negative for sinus pressure and sore throat.   Eyes: Negative for visual disturbance.  Respiratory: Negative for cough, chest tightness and shortness of breath.   Cardiovascular: Negative for chest pain and leg swelling.  Gastrointestinal: Negative for abdominal distention, abdominal pain, constipation and diarrhea.  Endocrine: Negative.   Genitourinary: Negative for dysuria.  Musculoskeletal: Negative for joint swelling and myalgias.  Skin: Negative for rash.       Skin lump  Allergic/Immunologic: Negative.   Neurological: Negative for weakness, light-headedness and numbness.  Psychiatric/Behavioral: Negative for dysphoric mood and suicidal ideas.    Objective:  BP (!) 153/77   Pulse 98   Temp 97.8 F (36.6 C) (Oral)   Wt 159 lb 9.6 oz (72.4 kg)   SpO2 99%   BMI 27.40 kg/m   BP/Weight 09/13/2016 06/25/2016 08/09/6158  Systolic BP 737 106 269  Diastolic BP 77 88 91  Wt. (Lbs) 159.6 - 161  BMI 27.4 - 27.64      Physical Exam  Constitutional: He is oriented to person, place, and time. He appears well-developed and well-nourished.  Cardiovascular: Normal rate, normal heart sounds and intact distal pulses.   No murmur heard. Pulmonary/Chest: Effort normal and breath sounds normal. He has no wheezes. He has no rales. He exhibits no tenderness.  Abdominal: Soft. Bowel sounds are normal. He exhibits no distension and no mass. There is no tenderness.  Musculoskeletal: Normal range of motion.  Minute slot nontender callus on feet bilaterally  Neurological: He is alert and oriented to person, place, and time.  Skin:  1.5 x 1.5 cm soft fluctuant swelling on the left upper arm which is not tender   Psychiatric: He has a normal mood and affect.      Lab Results  Component Value Date   HGBA1C 5.3 06/11/2016     Assessment & Plan:   1. Diabetes mellitus, new onset ([place]) Controlled with A1c of 5.3 - POCT glucose (manual entry) - metFORMIN (GLUCOPHAGE) 500 MG tablet; Take 0.5 tablets (250 mg total) by mouth daily.  Dispense: 30 tablet; Refill: 3  2. HTN (hypertension), benign Uncontrolled Increased dose of lisinopril/HCTZ - lisinopril-hydrochlorothiazide (PRINZIDE,ZESTORETIC) 20-25 MG tablet; Take 1 tablet by mouth daily.  Dispense: 30 tablet; Refill: 3  3. Subcutaneous cyst Advised to observe If increasing in size he will need referral for excision  4. Callus Soft callus Discussed foot care Need podiatry referral if worsening   Meds ordered this encounter  Medications  . lisinopril-hydrochlorothiazide (PRINZIDE,ZESTORETIC) 20-25 MG tablet    Sig: Take 1 tablet by mouth daily.    Dispense:  30 tablet    Refill:  3    Discontinue 10/12.5  . metFORMIN (GLUCOPHAGE) 500 MG tablet    Sig: Take 0.5 tablets (250 mg total) by mouth daily.    Dispense:  30 tablet    Refill:  3    Follow-up: Return in about 3 months (around 12/14/2016) for follow up on Hypertension and diabetes mellitus.   Arnoldo Morale MD

## 2016-10-02 MED FILL — TRUE METRIX TEST STRIP: 25 days supply | Qty: 100 | Fill #1

## 2016-10-02 MED FILL — TRUEplus LANCETS 28G MISC: 30 days supply | Qty: 100 | Fill #0

## 2016-10-15 MED FILL — LISINOPRIL-HCTZ 20-25 MG TA: 20-25 | 30 days supply | Qty: 30 | Fill #1

## 2016-10-15 MED FILL — ?METFORMIN HCL 500MG TABLET: 500 | 30 days supply | Qty: 15 | Fill #1

## 2016-11-18 MED FILL — LISINOPRIL-HCTZ 20-25 MG TA: 20-25 | 30 days supply | Qty: 30 | Fill #2

## 2016-11-18 MED FILL — TRUE METRIX TEST STRIP: 25 days supply | Qty: 100 | Fill #2

## 2016-12-20 ENCOUNTER — Encounter: Payer: Self-pay | Admitting: Family Medicine

## 2016-12-20 ENCOUNTER — Ambulatory Visit: Payer: Self-pay | Attending: Family Medicine | Admitting: Family Medicine

## 2016-12-20 VITALS — BP 130/78 | HR 101 | Temp 98.1°F | Wt 159.6 lb

## 2016-12-20 DIAGNOSIS — E119 Type 2 diabetes mellitus without complications: Secondary | ICD-10-CM | POA: Insufficient documentation

## 2016-12-20 DIAGNOSIS — I10 Essential (primary) hypertension: Secondary | ICD-10-CM | POA: Insufficient documentation

## 2016-12-20 DIAGNOSIS — Z23 Encounter for immunization: Secondary | ICD-10-CM | POA: Insufficient documentation

## 2016-12-20 DIAGNOSIS — Z7984 Long term (current) use of oral hypoglycemic drugs: Secondary | ICD-10-CM | POA: Insufficient documentation

## 2016-12-20 DIAGNOSIS — Z79899 Other long term (current) drug therapy: Secondary | ICD-10-CM | POA: Insufficient documentation

## 2016-12-20 LAB — GLUCOSE, POCT (MANUAL RESULT ENTRY): POC GLUCOSE: 157 mg/dL — AB (ref 70–99)

## 2016-12-20 LAB — POCT GLYCOSYLATED HEMOGLOBIN (HGB A1C): HEMOGLOBIN A1C: 5.6

## 2016-12-20 MED ORDER — LISINOPRIL-HYDROCHLOROTHIAZIDE 20-25 MG PO TABS: 1.0000 | ORAL_TABLET | Freq: Every day | ORAL | 3 refills | Status: DC

## 2016-12-20 MED ORDER — METFORMIN HCL 500 MG PO TABS: 250.0000 mg | ORAL_TABLET | Freq: Every day | ORAL | 3 refills | Status: DC

## 2016-12-20 MED ORDER — ATORVASTATIN CALCIUM 20 MG PO TABS: 20.0000 mg | ORAL_TABLET | Freq: Every day | ORAL | 3 refills | Status: DC

## 2016-12-20 NOTE — Patient Instructions (Signed)
Diabetes Mellitus and Food It is important for you to manage your blood sugar (glucose) level. Your blood glucose level can be greatly affected by what you eat. Eating healthier foods in the appropriate amounts throughout the day at about the same time each day will help you control your blood glucose level. It can also help slow or prevent worsening of your diabetes mellitus. Healthy eating may even help you improve the level of your blood pressure and reach or maintain a healthy weight. General recommendations for healthful eating and cooking habits include:  Eating meals and snacks regularly. Avoid going long periods of time without eating to lose weight.  Eating a diet that consists mainly of plant-based foods, such as fruits, vegetables, nuts, legumes, and whole grains.  Using low-heat cooking methods, such as baking, instead of high-heat cooking methods, such as deep frying.  Work with your dietitian to make sure you understand how to use the Nutrition Facts information on food labels. How can food affect me? Carbohydrates Carbohydrates affect your blood glucose level more than any other type of food. Your dietitian will help you determine how many carbohydrates to eat at each meal and teach you how to count carbohydrates. Counting carbohydrates is important to keep your blood glucose at a healthy level, especially if you are using insulin or taking certain medicines for diabetes mellitus. Alcohol Alcohol can cause sudden decreases in blood glucose (hypoglycemia), especially if you use insulin or take certain medicines for diabetes mellitus. Hypoglycemia can be a life-threatening condition. Symptoms of hypoglycemia (sleepiness, dizziness, and disorientation) are similar to symptoms of having too much alcohol. If your health care provider has given you approval to drink alcohol, do so in moderation and use the following guidelines:  Women should not have more than one drink per day, and men  should not have more than two drinks per day. One drink is equal to: ? 12 oz of beer. ? 5 oz of wine. ? 1 oz of hard liquor.  Do not drink on an empty stomach.  Keep yourself hydrated. Have water, diet soda, or unsweetened iced tea.  Regular soda, juice, and other mixers might contain a lot of carbohydrates and should be counted.  What foods are not recommended? As you make food choices, it is important to remember that all foods are not the same. Some foods have fewer nutrients per serving than other foods, even though they might have the same number of calories or carbohydrates. It is difficult to get your body what it needs when you eat foods with fewer nutrients. Examples of foods that you should avoid that are high in calories and carbohydrates but low in nutrients include:  Trans fats (most processed foods list trans fats on the Nutrition Facts label).  Regular soda.  Juice.  Candy.  Sweets, such as cake, pie, doughnuts, and cookies.  Fried foods.  What foods can I eat? Eat nutrient-rich foods, which will nourish your body and keep you healthy. The food you should eat also will depend on several factors, including:  The calories you need.  The medicines you take.  Your weight.  Your blood glucose level.  Your blood pressure level.  Your cholesterol level.  You should eat a variety of foods, including:  Protein. ? Lean cuts of meat. ? Proteins low in saturated fats, such as fish, egg whites, and beans. Avoid processed meats.  Fruits and vegetables. ? Fruits and vegetables that may help control blood glucose levels, such as apples,   mangoes, and yams.  Dairy products. ? Choose fat-free or low-fat dairy products, such as milk, yogurt, and cheese.  Grains, bread, pasta, and rice. ? Choose whole grain products, such as multigrain bread, whole oats, and brown rice. These foods may help control blood pressure.  Fats. ? Foods containing healthful fats, such as  nuts, avocado, olive oil, canola oil, and fish.  Does everyone with diabetes mellitus have the same meal plan? Because every person with diabetes mellitus is different, there is not one meal plan that works for everyone. It is very important that you meet with a dietitian who will help you create a meal plan that is just right for you. This information is not intended to replace advice given to you by your health care provider. Make sure you discuss any questions you have with your health care provider. Document Released: 12/06/2004 Document Revised: 08/17/2015 Document Reviewed: 02/05/2013 Elsevier Interactive Patient Education  2017 Elsevier Inc.  

## 2016-12-20 NOTE — Progress Notes (Signed)
Subjective:  Patient ID: Billy Gregory, male    DOB: 08-Aug-1980  Age: 36 y.o. MRN: [id number]  CC: Diabetes and Hypertension  HPI Billy Gregory is a 36 year old male presenting for follow-up on Diabetes and Hypertension. He has been compliant with his medications and tries to follow a low-sodium. He admits to trying to reduce carbs but still eats 5-6 tortillas daily. He does not exercise but states that he stays active because he works [employer] care.   He denies hypoglycemia, visual complaints and numbness in extremities. Denies SOB, fevers, or chest pains. Admits to increased stress at work and ocassional headaches when it's very hot outside and his is working.     He is requesting the flu vaccine at this time.  Past Medical History:  Diagnosis Date  . Diabetes mellitus without complication ([place])   . Hypertension     No past surgical history on file.  Outpatient Medications Prior to Visit  Medication Sig Dispense Refill  . acetaminophen (TYLENOL) 325 MG tablet Take 650 mg by mouth every 6 (six) hours as needed.    . Blood Glucose Monitoring Suppl (TRUE METRIX METER) w/Device KIT USE AS INSTRUCTED 1 kit 0  . glucose blood (TRUE METRIX BLOOD GLUCOSE TEST) test strip Test your blood sugar daily in the morning or when feeling badly 100 each 3  . TRUEPLUS LANCETS 28G MISC USE TO CHECK BLOOD SUGARS 3 TIMES DAILY 100 each 3  . lisinopril-hydrochlorothiazide (PRINZIDE,ZESTORETIC) 20-25 MG tablet Take 1 tablet by mouth daily. 30 tablet 3  . metFORMIN (GLUCOPHAGE) 500 MG tablet Take 0.5 tablets (250 mg total) by mouth daily. 30 tablet 3  . terbinafine (LAMISIL) 250 MG tablet Take 1 tablet (250 mg total) by mouth daily. (Patient not taking: Reported on 09/13/2016) 30 tablet 2   No facility-administered medications prior to visit.     ROS Review of Systems  Constitutional: Negative for activity change and appetite change.  HENT: Negative for sinus pressure and sore throat.    Eyes: Negative for visual changes Respiratory: Negative for cough, chest tightness and shortness of breath.   Cardiovascular: Negative for chest pain, edema, or dyspnea.  Gastrointestinal: Negative abdominal pain, diarrhea, or constipation.   Genitourinary: Negative for dysuria.  Musculoskeletal: Negative for joint swelling and myalgias.  Skin: Negative for rash.    Neurological: Negative for weakness, light-headedness and numbness.  Psychiatric/Behavioral: Negative for dysphoric mood   Objective:  BP 130/78   Pulse (!) 101   Temp 98.1 F (36.7 C) (Oral)   Wt 159 lb 9.6 oz (72.4 kg)   SpO2 100%   BMI 27.40 kg/m   BP/Weight 12/20/2016 5/46/5035 06/29/5679  Systolic BP 275 170 017  Diastolic BP 78 77 88  Wt. (Lbs) 159.6 159.6 -  BMI 27.4 27.4 -    Physical Exam Constitutional: He is oriented to person, place, and time. He appears well-developed and well-nourished.  Cardiovascular: regular rate and rhythm, normal heart sounds, no murmurs, rub or gallop, no pedal edema Respiratory: clear to auscultation bilaterally, no wheezes, no rales, no rhonchi Abdomen: soft, not tender to palpation, normal bowel sounds, no enlarged organs Extremities: Full ROM  Skin: warm and dry. Neurological: alert, oriented x3, Psychological: normal mood and affect.  Lab Results  Component Value Date   HGBA1C 5.6 12/20/2016   Lab Results  Component Value Date   CHOL 136 06/11/2016   HDL 33 (L) 06/11/2016   LDLCALC 77 06/11/2016   TRIG 132 06/11/2016  CHOLHDL 4.1 06/11/2016   BMET    Component Value Date/Time   NA 138 06/11/2016 1441   K 4.4 06/11/2016 1441   CL 105 06/11/2016 1441   CO2 23 06/11/2016 1441   GLUCOSE 106 (H) 06/11/2016 1441   BUN 17 06/11/2016 1441   CREATININE 0.83 06/11/2016 1441   CALCIUM 9.6 06/11/2016 1441   GFRNONAA >89 06/11/2016 1441   GFRAA >89 06/11/2016 1441     Assessment & Plan:   1. Type 2 diabetes mellitus without complication, without long-term  current use of insulin (HCC) Controlled with a A1C of 5.6 today POCT glucose (157)  Discussed importance of a yearly ophthalmology exam and advised patient to make an appointment. Also discussed the benefit of a statin in preventing complictions of diabetes. Patient is agreeable to starting atorvastatin.   - metFORMIN (GLUCOPHAGE) 500 MG tablet; Take 0.5 tablets (250 mg total) by mouth daily.  Dispense: 30 tablet; Refill: 3 - atorvastatin (LIPITOR) 20 MG tablet; Take 1 tablet (20 mg total) by mouth daily.  Dispense: 30 tablet; Refill: 3  2. HTN (hypertension), benign Controlled We have discussed target BP range and blood pressure goal. I have advised patient to check BP regularly. We discussed the importance of compliance with medical therapy and DASH diet recommended, consequences of uncontrolled hypertension discussed. Discussed importance of hydration especially during the summer months.  - continue current BP medications Also discussed ways to cope with stress in the work place including relaxtion and deep breathing.   - lisinopril-hydrochlorothiazide (PRINZIDE,ZESTORETIC) 20-25 MG tablet; Take 1 tablet by mouth daily.  Dispense: 30 tablet; Refill: 3 - Basic Metabolic Panel   3. Need for influenza vaccination Given today.   Meds ordered this encounter  Medications  . metFORMIN (GLUCOPHAGE) 500 MG tablet    Sig: Take 0.5 tablets (250 mg total) by mouth daily.    Dispense:  30 tablet    Refill:  3  . lisinopril-hydrochlorothiazide (PRINZIDE,ZESTORETIC) 20-25 MG tablet    Sig: Take 1 tablet by mouth daily.    Dispense:  30 tablet    Refill:  3    Discontinue 10/12.5  . atorvastatin (LIPITOR) 20 MG tablet    Sig: Take 1 tablet (20 mg total) by mouth daily.    Dispense:  30 tablet    Refill:  3    Follow-up: Return in about 3 months (around 03/21/2017) for follow up of Diabetes.

## 2016-12-21 LAB — BASIC METABOLIC PANEL
BUN/Creatinine Ratio: 25 — ABNORMAL HIGH (ref 9–20)
BUN: 18 mg/dL (ref 6–20)
CALCIUM: 9.7 mg/dL (ref 8.7–10.2)
CO2: 23 mmol/L (ref 20–29)
CREATININE: 0.72 mg/dL — AB (ref 0.76–1.27)
Chloride: 99 mmol/L (ref 96–106)
GFR calc Af Amer: 138 mL/min/{1.73_m2} (ref >59)
GFR, EST NON AFRICAN AMERICAN: 119 mL/min/{1.73_m2} (ref >59)
GLUCOSE: 132 mg/dL — AB (ref 65–99)
Potassium: 4.6 mmol/L (ref 3.5–5.2)
Sodium: 137 mmol/L (ref 134–144)

## 2016-12-23 MED FILL — LISINOPRIL-HCTZ 20-25 MG TA: 20-25 | 30 days supply | Qty: 30 | Fill #0

## 2016-12-23 MED FILL — ?ATORVASTATIN 20 MG TABLET: 20 | 30 days supply | Qty: 30 | Fill #0

## 2016-12-23 MED FILL — ?METFORMIN HCL 500MG TABLET: 500 | 30 days supply | Qty: 15 | Fill #0

## 2016-12-24 ENCOUNTER — Telehealth: Payer: Self-pay

## 2016-12-24 NOTE — Telephone Encounter (Signed)
Pt was called and informed of lab results.  [phone]

## 2016-12-30 MED FILL — TRUE METRIX TEST STRIP: 25 days supply | Qty: 100 | Fill #3

## 2017-01-13 MED FILL — TRUEplus LANCETS 28G MISC: 30 days supply | Qty: 100 | Fill #1

## 2017-01-21 MED FILL — ATORVASTATIN 20 MG TABLET: 20 | 30 days supply | Qty: 30 | Fill #1

## 2017-02-05 MED FILL — LISINOPRIL-HCTZ 20-25 MG TA: 20-25 | 30 days supply | Qty: 30 | Fill #1

## 2017-02-17 ENCOUNTER — Other Ambulatory Visit: Payer: Self-pay | Admitting: Internal Medicine

## 2017-02-17 MED FILL — TRUEplus LANCETS 28G MISC: 30 days supply | Qty: 100 | Fill #2

## 2017-02-17 MED FILL — TRUE METRIX GLUCOSE TEST ST: 30 days supply | Qty: 100 | Fill #0

## 2017-02-21 MED FILL — ?ATORVASTATIN 20 MG TABLET: 20 | 30 days supply | Qty: 30 | Fill #2

## 2017-02-28 ENCOUNTER — Ambulatory Visit: Payer: Self-pay | Attending: Family Medicine

## 2017-03-06 MED FILL — LISINOPRIL-HCTZ 20-25 MG TA: 20-25 | 30 days supply | Qty: 30 | Fill #2

## 2017-03-06 MED FILL — ?METFORMIN HCL 500MG TABLET: 500 | 30 days supply | Qty: 15 | Fill #2

## 2017-03-21 ENCOUNTER — Ambulatory Visit: Payer: Self-pay | Attending: Family Medicine | Admitting: Family Medicine

## 2017-03-21 ENCOUNTER — Encounter: Payer: Self-pay | Admitting: Family Medicine

## 2017-03-21 VITALS — BP 133/76 | HR 91 | Temp 98.0°F | Resp 18 | Ht 64.0 in | Wt 159.0 lb

## 2017-03-21 DIAGNOSIS — I10 Essential (primary) hypertension: Secondary | ICD-10-CM | POA: Insufficient documentation

## 2017-03-21 DIAGNOSIS — E119 Type 2 diabetes mellitus without complications: Secondary | ICD-10-CM | POA: Insufficient documentation

## 2017-03-21 DIAGNOSIS — Z79899 Other long term (current) drug therapy: Secondary | ICD-10-CM | POA: Insufficient documentation

## 2017-03-21 DIAGNOSIS — Z7984 Long term (current) use of oral hypoglycemic drugs: Secondary | ICD-10-CM | POA: Insufficient documentation

## 2017-03-21 LAB — GLUCOSE, POCT (MANUAL RESULT ENTRY): POC Glucose: 127 mg/dL — AB (ref 70–99)

## 2017-03-21 MED ORDER — ATORVASTATIN CALCIUM 20 MG PO TABS: 20.0000 mg | ORAL_TABLET | Freq: Every day | ORAL | 3 refills | Status: DC

## 2017-03-21 MED ORDER — LISINOPRIL-HYDROCHLOROTHIAZIDE 20-25 MG PO TABS: 1.0000 | ORAL_TABLET | Freq: Every day | ORAL | 3 refills | Status: DC

## 2017-03-21 MED ORDER — METFORMIN HCL 500 MG PO TABS: 250.0000 mg | ORAL_TABLET | Freq: Every day | ORAL | 3 refills | Status: DC

## 2017-03-21 NOTE — Patient Instructions (Signed)
Diabetes mellitus y nutrición  Diabetes Mellitus and Nutrition  Si sufre de diabetes (diabetes mellitus), es muy importante tener hábitos alimenticios saludables debido a que sus niveles de azúcar en la sangre (glucosa) se ven afectados en gran medida por lo que come y bebe. Comer alimentos saludables en las cantidades adecuadas, aproximadamente a la misma hora todos los días, lo ayudará a:  · Controlar la glucemia.  · Disminuir el riesgo de sufrir una enfermedad cardíaca.  · Mejorar la presión arterial.  · Alcanzar o mantener un peso saludable.    Todas las personas que sufren de diabetes son diferentes y cada una tiene necesidades diferentes en cuanto a un plan de alimentación. El médico puede recomendarle que trabaje con un especialista en dietas y nutrición (nutricionista) para elaborar el mejor plan para usted. Su plan de alimentación puede variar según factores como:  · Las calorías que necesita.  · Los medicamentos que toma.  · Su peso.  · Sus niveles de glucemia, presión arterial y colesterol.  · Su nivel de actividad.  · Otras afecciones que tenga, como enfermedades cardíacas o renales.    ¿Cómo me afectan los carbohidratos?  Los carbohidratos afectan el nivel de glucemia más que cualquier otro tipo de alimento. La ingesta de carbohidratos naturalmente aumenta la cantidad glucosa en la sangre. El recuento de carbohidratos es un método destinado a llevar un registro de la cantidad de carbohidratos que se ingieren. El recuento de carbohidratos es importante para mantener la glucemia a un nivel saludable, en especial si utiliza insulina o toma determinados medicamentos por vía oral para la diabetes.  Es importante saber la cantidad de carbohidratos que se pueden ingerir en cada comida sin correr ningún riesgo. Esto es diferente en cada persona. El nutricionista puede ayudarlo a calcular la cantidad de carbohidratos que debe ingerir en cada comida y colación.   Los alimentos que contienen carbohidratos incluyen:  · Pan, cereal, arroz, pasta y galletas.  · Papas y maíz.  · Guisantes, frijoles y lentejas.  · Leche y yogur.  · Frutas y jugo.  · Postres, como pasteles, galletitas, helado y caramelos.    ¿Cómo me afecta el alcohol?  El alcohol puede provocar disminuciones súbitas de la glucemia (hipoglucemia), en especial si utiliza insulina o toma determinados medicamentos por vía oral para la diabetes. La hipoglucemia es una afección potencialmente mortal. Los síntomas de la hipoglucemia (somnolencia, mareos y confusión) son similares a los síntomas de haber consumido demasiado alcohol.  Si el médico afirma que el alcohol es seguro para usted, siga estas pautas:  · Limite el consumo de alcohol a no más de 1 medida por día si es mujer y no está embarazada, y a 2 medidas si es hombre. Una medida equivale a 12 oz (355 ml) de cerveza, 5 oz (148 ml) de vino o 1½ oz (44 ml) de bebidas de alta graduación alcohólica.  · No beba con el estómago vacío.  · Manténgase hidratado con agua, gaseosas dietéticas o té helado sin azúcar.  · Tenga en cuenta que las gaseosas comunes, los jugos y otros refrescos pueden contener mucha azúcar y se deben contar como carbohidratos.    Consejos para seguir este plan  Leer las etiquetas de los alimentos  · Comience por controlar el tamaño de la porción en la etiqueta. La cantidad de calorías, carbohidratos, grasas y otros nutrientes mencionados en la etiqueta se basan en una porción del alimento. Muchos alimentos contienen más de una porción por envase.  · Verifique la cantidad total de gramos (g)   de carbohidratos totales en una porción. Puede calcular la cantidad de porciones de carbohidratos al dividir el total de carbohidratos por 15. Por ejemplo, si un alimento posee un total de 30 g de carbohidratos, equivale a 2 porciones de carbohidratos.  · Verifique la cantidad de gramos (g) de grasas saturadas y grasas trans  en una porción. Escoja alimentos que no contengan grasa o que tengan un bajo contenido.  · Controle la cantidad de miligramos (mg) de sodio en una porción. La mayoría de las personas deben limitar la ingesta de sodio total a menos de 2300 mg por día.  · Siempre consulte la información nutricional de los alimentos etiquetados como “con bajo contenido de grasa” o “sin grasa”. Estos alimentos pueden ser más altos en azúcar agregada o en carbohidratos refinados y deben evitarse.  · Hable con el nutricionista para identificar sus objetivos diarios en cuanto a los nutrientes mencionados en la etiqueta.  De compras  · Evite comprar alimentos procesados, enlatados o prehechos. Estos alimentos tienden a tener mayor cantidad de grasa, sodio y azúcar agregada.  · Compre en la zona exterior de la tienda de comestibles. Esta incluye frutas y vegetales frescos, granos a granel, carnes frescas y productos lácteos frescos.  Cocción  · Utilice métodos de cocción a baja temperatura, como hornear, en lugar de métodos de cocción a alta temperatura, como freír en abundante aceite.  · Cocine con aceites saludables, como el aceite de oliva, canola o girasol.  · Evite cocinar con manteca, crema o carnes con alto contenido de grasa.  Planificación de las comidas  · Consuma las comidas y las colaciones de forma regular, preferentemente a la misma hora todos los días. Evite pasar largos períodos de tiempo sin comer.  · Consuma alimentos ricos en fibra, como frutas frescas, verduras, frijoles y cereales integrales. Consulte al nutricionista sobre cuántas porciones de carbohidratos puede consumir en cada comida.  · Consuma entre 4 y 6 onzas de proteínas magras por día, como carnes magras, pollo, pescado, huevos o tofu. 1 onza equivale a 1 onza de carne, pollo o pescado, 1 huevo, o 1/4 taza de tofu.  · Coma algunos alimentos por día que contengan grasas saludables, como aguacates, frutos secos, semillas y pescado.  Estilo de vida     · Controle su nivel de glucemia con regularidad.  · Haga ejercicio al menos 30 minutos, 5 días o más por semana, o como se lo haya indicado el médico.  · Tome los medicamentos como se lo haya indicado el médico.  · No consuma ningún producto que contenga nicotina o tabaco, como cigarrillos y cigarrillos electrónicos. Si necesita ayuda para dejar de fumar, consulte al médico.  · Trabaje con un asesor o instructor en diabetes para identificar estrategias para controlar el estrés y cualquier desafío emocional y social.  ¿Cuáles son algunas de las preguntas que puedo hacerle a mi médico?  · ¿Es necesario que me reúna con un instructor en diabetes?  · ¿Es necesario que me reúna con un nutricionista?  · ¿A qué número puedo llamar si tengo preguntas?  · ¿Cuáles son los mejores momentos para controlar la glucemia?  Dónde encontrar más información:  · Asociación Americana de la Diabetes (American Diabetes Association): diabetes.org/food-and-fitness/food  · Academia de Nutrición y Dietética (Academy of Nutrition and Dietetics): www.eatright.org/resources/health/diseases-and-conditions/diabetes  · Instituto Nacional de la Diabetes y las Enfermedades Digestivas y Renales (National Institute of Diabetes and Digestive and Kidney Diseases) (Institutos Nacionales de Salud, NIH): www.niddk.nih.gov/health-information/diabetes/overview/diet-eating-physical-activity  Resumen  · Un plan de alimentación saludable   lo ayudará a controlar la glucemia y mantener un estilo de vida saludable.  · Trabajar con un especialista en dietas y nutrición (nutricionista) puede ayudarlo a elaborar el mejor plan de alimentación para usted.  · Tenga en cuenta que los carbohidratos y el alcohol tienen efectos inmediatos en sus niveles de glucemia. Es importante contar los carbohidratos y consumir alcohol con prudencia.  Esta información no tiene como fin reemplazar el consejo del médico. Asegúrese de hacerle al médico cualquier pregunta que tenga.   Document Released: 06/18/2007 Document Revised: 07/01/2016 Document Reviewed: 07/01/2016  Elsevier Interactive Patient Education © 2018 [employer] Inc.

## 2017-03-21 NOTE — Progress Notes (Signed)
Subjective:  Patient ID: Billy Gregory, male    DOB: 1981-02-22  Age: 36 y.o. MRN: [id number]  CC: Follow-up   HPI Billy Gregory is a 36 year old male with a history of hypertension, type 2 diabetes mellitus (A1c 5.6) who presents today follow-up visit.  His blood sugars have been controlled and he has been taking 250 mg of metformin every other day and denies hypoglycemia. Denies numbness in extremities, visual concerns and is not up-to-date on annual eye exam. He has been compliant with his antihypertensive and tolerates his medications with no adverse effects.  He is accompanied by his wife today and has no acute concerns.  Past Medical History:  Diagnosis Date  . Diabetes mellitus without complication ([place])   . Hypertension     History reviewed. No pertinent surgical history.  No Known Allergies   Outpatient Medications Prior to Visit  Medication Sig Dispense Refill  . acetaminophen (TYLENOL) 325 MG tablet Take 650 mg by mouth every 6 (six) hours as needed.    . Blood Glucose Monitoring Suppl (TRUE METRIX METER) w/Device KIT USE AS INSTRUCTED 1 kit 0  . glucose blood test strip Test your blood sugar daily in the morning or when feeling badly 100 each 3  . TRUEPLUS LANCETS 28G MISC USE TO CHECK BLOOD SUGARS 3 TIMES DAILY 100 each 3  . atorvastatin (LIPITOR) 20 MG tablet Take 1 tablet (20 mg total) by mouth daily. 30 tablet 3  . lisinopril-hydrochlorothiazide (PRINZIDE,ZESTORETIC) 20-25 MG tablet Take 1 tablet by mouth daily. 30 tablet 3  . metFORMIN (GLUCOPHAGE) 500 MG tablet Take 0.5 tablets (250 mg total) by mouth daily. 30 tablet 3  . terbinafine (LAMISIL) 250 MG tablet Take 1 tablet (250 mg total) by mouth daily. (Patient not taking: Reported on 09/13/2016) 30 tablet 2   No facility-administered medications prior to visit.     ROS Review of Systems  Constitutional: Negative for activity change and appetite change.  HENT: Negative for sinus pressure  and sore throat.   Eyes: Negative for visual disturbance.  Respiratory: Negative for cough, chest tightness and shortness of breath.   Cardiovascular: Negative for chest pain and leg swelling.  Gastrointestinal: Negative for abdominal distention, abdominal pain, constipation and diarrhea.  Endocrine: Negative.   Genitourinary: Negative for dysuria.  Musculoskeletal: Negative for joint swelling and myalgias.  Skin: Negative for rash.  Allergic/Immunologic: Negative.   Neurological: Negative for weakness, light-headedness and numbness.  Psychiatric/Behavioral: Negative for dysphoric mood and suicidal ideas.    Objective:  BP 133/76 (BP Location: Right Arm, Patient Position: Sitting, Cuff Size: Normal)   Pulse 91   Temp 98 F (36.7 C) (Oral)   Resp 18   Ht 5' 4"  (1.626 m)   Wt 159 lb (72.1 kg)   SpO2 99%   BMI 27.29 kg/m   BP/Weight 03/21/2017 12/20/2016 4/81/8563  Systolic BP 149 702 637  Diastolic BP 76 78 77  Wt. (Lbs) 159 159.6 159.6  BMI 27.29 27.4 27.4      Physical Exam  Constitutional: He is oriented to person, place, and time. He appears well-developed and well-nourished.  Cardiovascular: Normal rate, normal heart sounds and intact distal pulses.  No murmur heard. Pulmonary/Chest: Effort normal and breath sounds normal. He has no wheezes. He has no rales. He exhibits no tenderness.  Abdominal: Soft. Bowel sounds are normal. He exhibits no distension and no mass. There is no tenderness.  Musculoskeletal: Normal range of motion.  Neurological: He is alert and oriented to person,  place, and time.  Skin: Skin is warm and dry.  Psychiatric: He has a normal mood and affect.     Lab Results  Component Value Date   HGBA1C 5.6 12/20/2016    Assessment & Plan:   1. Type 2 diabetes mellitus without complication, without long-term current use of insulin (HCC) Controlled with A1c of 5.6 Currently taking metformin 250 mg every other day Diabetic diet - Glucose  (CBG) - atorvastatin (LIPITOR) 20 MG tablet; Take 1 tablet (20 mg total) by mouth daily.  Dispense: 30 tablet; Refill: 3 - metFORMIN (GLUCOPHAGE) 500 MG tablet; Take 0.5 tablets (250 mg total) by mouth daily.  Dispense: 30 tablet; Refill: 3  2. HTN (hypertension), benign Controlled Low-sodium diet - lisinopril-hydrochlorothiazide (PRINZIDE,ZESTORETIC) 20-25 MG tablet; Take 1 tablet by mouth daily.  Dispense: 30 tablet; Refill: 3   Meds ordered this encounter  Medications  . atorvastatin (LIPITOR) 20 MG tablet    Sig: Take 1 tablet (20 mg total) by mouth daily.    Dispense:  30 tablet    Refill:  3  . lisinopril-hydrochlorothiazide (PRINZIDE,ZESTORETIC) 20-25 MG tablet    Sig: Take 1 tablet by mouth daily.    Dispense:  30 tablet    Refill:  3  . metFORMIN (GLUCOPHAGE) 500 MG tablet    Sig: Take 0.5 tablets (250 mg total) by mouth daily.    Dispense:  30 tablet    Refill:  3    Follow-up: Return in about 3 months (around 06/19/2017) for Follow up on diabetes mellitus.   Arnoldo Morale MD

## 2017-04-01 ENCOUNTER — Other Ambulatory Visit: Payer: Self-pay | Admitting: Family Medicine

## 2017-04-01 DIAGNOSIS — E119 Type 2 diabetes mellitus without complications: Secondary | ICD-10-CM

## 2017-04-01 MED FILL — ?ATORVASTATIN 20 MG TABLET: 20 | 30 days supply | Qty: 30 | Fill #3

## 2017-04-18 MED FILL — TRUE METRIX GLUCOSE TEST ST: 30 days supply | Qty: 100 | Fill #1

## 2017-04-18 MED FILL — LISINOPRIL-HCTZ 20-25 MG TA: 20-25 | 30 days supply | Qty: 30 | Fill #3

## 2017-05-06 MED FILL — ?ATORVASTATIN 20MG TABL: 20 | 30 days supply | Qty: 30 | Fill #0

## 2017-05-20 MED FILL — metFORMIN HCL 500 MG TABS: 500 | 30 days supply | Qty: 15 | Fill #3

## 2017-05-20 MED FILL — LISINOPRIL-HCTZ 20-25 MG TA: 20-25 | 30 days supply | Qty: 30 | Fill #0

## 2017-06-10 MED FILL — ATORVASTATIN 20 MG TABLET: 20 | 30 days supply | Qty: 30 | Fill #1

## 2017-06-23 ENCOUNTER — Ambulatory Visit: Payer: Self-pay | Attending: Family Medicine | Admitting: Family Medicine

## 2017-06-23 ENCOUNTER — Encounter: Payer: Self-pay | Admitting: Family Medicine

## 2017-06-23 VITALS — BP 143/91 | HR 91 | Temp 97.8°F | Ht 64.0 in | Wt 160.4 lb

## 2017-06-23 DIAGNOSIS — Z79899 Other long term (current) drug therapy: Secondary | ICD-10-CM | POA: Insufficient documentation

## 2017-06-23 DIAGNOSIS — E119 Type 2 diabetes mellitus without complications: Secondary | ICD-10-CM

## 2017-06-23 DIAGNOSIS — E118 Type 2 diabetes mellitus with unspecified complications: Secondary | ICD-10-CM | POA: Insufficient documentation

## 2017-06-23 DIAGNOSIS — Z7984 Long term (current) use of oral hypoglycemic drugs: Secondary | ICD-10-CM | POA: Insufficient documentation

## 2017-06-23 DIAGNOSIS — I10 Essential (primary) hypertension: Secondary | ICD-10-CM | POA: Insufficient documentation

## 2017-06-23 LAB — POCT GLYCOSYLATED HEMOGLOBIN (HGB A1C): HEMOGLOBIN A1C: 6.2

## 2017-06-23 LAB — GLUCOSE, POCT (MANUAL RESULT ENTRY): POC Glucose: 108 mg/dl — AB (ref 70–99)

## 2017-06-23 MED ORDER — METFORMIN HCL 500 MG PO TABS: 250.0000 mg | ORAL_TABLET | Freq: Every day | ORAL | 6 refills | Status: DC

## 2017-06-23 MED ORDER — ATORVASTATIN CALCIUM 20 MG PO TABS: 20.0000 mg | ORAL_TABLET | Freq: Every day | ORAL | 6 refills | Status: DC

## 2017-06-23 MED ORDER — LISINOPRIL-HYDROCHLOROTHIAZIDE 20-25 MG PO TABS: 1.0000 | ORAL_TABLET | Freq: Every day | ORAL | 6 refills | Status: DC

## 2017-06-23 MED FILL — metFORMIN HCL 500 MG TABS: 500 | 30 days supply | Qty: 15 | Fill #0

## 2017-06-23 MED FILL — LISINOPRIL-HCTZ 20-25 MG TA: 20-25 | 30 days supply | Qty: 30 | Fill #0

## 2017-06-23 NOTE — Progress Notes (Signed)
Subjective:  Patient ID: Billy Gregory, male    DOB: 03/11/1981  Age: 37 y.o. MRN: [id number]  CC: Diabetes   HPI Billy Gregory  is a 37 -year-old male with a history of hypertension, type 2 diabetes mellitus (A1c 6.2) who presents today follow-up visit.  His A1c has trended up from 5.6 and is now 6.2 and he has been taking 250 mg of metformin every other day and denies hypoglycemia. Denies numbness in extremities, visual concerns and is not up-to-date on annual eye exam but has an upcoming appointment for an eye exam next week. He has been compliant with his antihypertensive and tolerates his medications with no adverse effects. He is accompanied by his wife today and has no acute concerns. The major form of exercise he gets is at his job where he walks frequently.  Past Medical History:  Diagnosis Date  . Diabetes mellitus without complication ([place])   . Hypertension     History reviewed. No pertinent surgical history.  Family History  Problem Relation Age of Onset  . Diabetes Paternal Aunt     Outpatient Medications Prior to Visit  Medication Sig Dispense Refill  . acetaminophen (TYLENOL) 325 MG tablet Take 650 mg by mouth every 6 (six) hours as needed.    . Blood Glucose Monitoring Suppl (TRUE METRIX METER) w/Device KIT USE AS INSTRUCTED 1 kit 0  . glucose blood test strip Test your blood sugar daily in the morning or when feeling badly 100 each 3  . TRUEPLUS LANCETS 28G MISC USE TO CHECK BLOOD SUGARS 3 TIMES DAILY 100 each 3  . atorvastatin (LIPITOR) 20 MG tablet Take 1 tablet (20 mg total) by mouth daily. 30 tablet 3  . lisinopril-hydrochlorothiazide (PRINZIDE,ZESTORETIC) 20-25 MG tablet Take 1 tablet by mouth daily. 30 tablet 3  . metFORMIN (GLUCOPHAGE) 500 MG tablet Take 0.5 tablets (250 mg total) by mouth daily. 30 tablet 3   No facility-administered medications prior to visit.     ROS Review of Systems  Constitutional: Negative for activity  change and appetite change.  HENT: Negative for sinus pressure and sore throat.   Eyes: Negative for visual disturbance.  Respiratory: Negative for cough, chest tightness and shortness of breath.   Cardiovascular: Negative for chest pain and leg swelling.  Gastrointestinal: Negative for abdominal distention, abdominal pain, constipation and diarrhea.  Endocrine: Negative.   Genitourinary: Negative for dysuria.  Musculoskeletal: Negative for joint swelling and myalgias.  Skin: Negative for rash.  Allergic/Immunologic: Negative.   Neurological: Negative for weakness, light-headedness and numbness.  Psychiatric/Behavioral: Negative for dysphoric mood and suicidal ideas.    Objective:  BP (!) 143/91   Pulse 91   Temp 97.8 F (36.6 C) (Oral)   Ht 5' 4"  (1.626 m)   Wt 160 lb 6.4 oz (72.8 kg)   SpO2 99%   BMI 27.53 kg/m   BP/Weight 06/23/2017 03/21/2017 6/33/3545  Systolic BP 625 638 937  Diastolic BP 91 76 78  Wt. (Lbs) 160.4 159 159.6  BMI 27.53 27.29 27.4      Physical Exam  Constitutional: He is oriented to person, place, and time. He appears well-developed and well-nourished.  Cardiovascular: Normal rate, normal heart sounds and intact distal pulses.  No murmur heard. Pulmonary/Chest: Effort normal and breath sounds normal. He has no wheezes. He has no rales. He exhibits no tenderness.  Abdominal: Soft. Bowel sounds are normal. He exhibits no distension and no mass. There is no tenderness.  Musculoskeletal: Normal range of motion.  Neurological: He is alert and oriented to person, place, and time.     CMP Latest Ref Rng & Units 12/20/2016 06/11/2016 03/04/2016  Glucose 65 - 99 mg/dL 132(H) 106(H) 103(H)  BUN 6 - 20 mg/dL 18 17 14   Creatinine 0.76 - 1.27 mg/dL 0.72(L) 0.83 0.81  Sodium 134 - 144 mmol/L 137 138 139  Potassium 3.5 - 5.2 mmol/L 4.6 4.4 4.6  Chloride 96 - 106 mmol/L 99 105 104  CO2 20 - 29 mmol/L 23 23 29   Calcium 8.7 - 10.2 mg/dL 9.7 9.6 9.5  Total  Protein 6.1 - 8.1 g/dL - - -  Total Bilirubin 0.2 - 1.2 mg/dL - - -  Alkaline Phos 40 - 115 U/L - - -  AST 10 - 40 U/L - - -  ALT 9 - 46 U/L - - -    Lab Results  Component Value Date   HGBA1C 6.2 06/23/2017    Lab Results  Component Value Date   HGBA1C 6.2 06/23/2017    Assessment & Plan:   1. Type 2 diabetes mellitus with complication, without long-term current use of insulin (HCC) Controlled with A1c of 6.2 which has trended up from 5.6 previously Advised to increase metformin 50 mg from every other day to daily dosing Diabetic diet - POCT glycosylated hemoglobin (Hb A1C)  2. HTN (hypertension), benign Slightly elevated No regimen change Counseled on blood pressure goal of less than 130/80, low-sodium, DASH diet, medication compliance, 150 minutes of moderate intensity exercise per week. Discussed medication compliance, adverse effects. - lisinopril-hydrochlorothiazide (PRINZIDE,ZESTORETIC) 20-25 MG tablet; Take 1 tablet by mouth daily.  Dispense: 30 tablet; Refill: 6  3. Type 2 diabetes mellitus without complication, without long-term current use of insulin (HCC) Has an upcoming appointment for eye exam next week - POCT glucose (manual entry) - atorvastatin (LIPITOR) 20 MG tablet; Take 1 tablet (20 mg total) by mouth daily.  Dispense: 30 tablet; Refill: 6 - metFORMIN (GLUCOPHAGE) 500 MG tablet; Take 0.5 tablets (250 mg total) by mouth daily.  Dispense: 30 tablet; Refill: 6 - Lipid panel; Future - CMP14+EGFR; Future - Microalbumin/Creatinine Ratio, Urine; Future   Meds ordered this encounter  Medications  . lisinopril-hydrochlorothiazide (PRINZIDE,ZESTORETIC) 20-25 MG tablet    Sig: Take 1 tablet by mouth daily.    Dispense:  30 tablet    Refill:  6  . atorvastatin (LIPITOR) 20 MG tablet    Sig: Take 1 tablet (20 mg total) by mouth daily.    Dispense:  30 tablet    Refill:  6  . metFORMIN (GLUCOPHAGE) 500 MG tablet    Sig: Take 0.5 tablets (250 mg total) by  mouth daily.    Dispense:  30 tablet    Refill:  6    Follow-up: Return in about 6 months (around 12/23/2017) for follow up of diabetes and hypertension.   Charlott Rakes MD

## 2017-06-24 MED FILL — TRUEplus LANCETS 28G MISC: 30 days supply | Qty: 100 | Fill #3

## 2017-06-24 MED FILL — LISINOPRIL-HCTZ 20-25 MG TA: 20-25 | 30 days supply | Qty: 30 | Fill #1

## 2017-06-24 MED FILL — TRUE METRIX TEST STRIP: 30 days supply | Qty: 100 | Fill #2

## 2017-06-27 ENCOUNTER — Ambulatory Visit: Payer: Self-pay | Attending: Family Medicine

## 2017-06-27 DIAGNOSIS — E119 Type 2 diabetes mellitus without complications: Secondary | ICD-10-CM | POA: Insufficient documentation

## 2017-06-27 NOTE — Progress Notes (Signed)
Patient here for lab visit only 

## 2017-06-28 LAB — CMP14+EGFR
A/G RATIO: 1.7 (ref 1.2–2.2)
ALK PHOS: 47 IU/L (ref 39–117)
ALT: 26 IU/L (ref 0–44)
AST: 20 IU/L (ref 0–40)
Albumin: 4.6 g/dL (ref 3.5–5.5)
BILIRUBIN TOTAL: 1.5 mg/dL — AB (ref 0.0–1.2)
BUN/Creatinine Ratio: 22 — ABNORMAL HIGH (ref 9–20)
BUN: 21 mg/dL — ABNORMAL HIGH (ref 6–20)
CO2: 25 mmol/L (ref 20–29)
Calcium: 9.3 mg/dL (ref 8.7–10.2)
Chloride: 101 mmol/L (ref 96–106)
Creatinine, Ser: 0.97 mg/dL (ref 0.76–1.27)
GFR calc Af Amer: 114 mL/min/{1.73_m2} (ref >59)
GFR calc non Af Amer: 99 mL/min/{1.73_m2} (ref >59)
GLOBULIN, TOTAL: 2.7 g/dL (ref 1.5–4.5)
Glucose: 108 mg/dL — ABNORMAL HIGH (ref 65–99)
POTASSIUM: 4.3 mmol/L (ref 3.5–5.2)
SODIUM: 140 mmol/L (ref 134–144)
Total Protein: 7.3 g/dL (ref 6.0–8.5)

## 2017-06-28 LAB — MICROALBUMIN / CREATININE URINE RATIO
CREATININE, UR: 149.1 mg/dL
Microalb/Creat Ratio: 9.4 mg/g creat (ref 0.0–30.0)
Microalbumin, Urine: 14 ug/mL

## 2017-06-28 LAB — LIPID PANEL
CHOL/HDL RATIO: 3 ratio (ref 0.0–5.0)
Cholesterol, Total: 109 mg/dL (ref 100–199)
HDL: 36 mg/dL — ABNORMAL LOW (ref >39)
LDL CALC: 58 mg/dL (ref 0–99)
TRIGLYCERIDES: 74 mg/dL (ref 0–149)
VLDL Cholesterol Cal: 15 mg/dL (ref 5–40)

## 2017-07-14 ENCOUNTER — Telehealth: Payer: Self-pay

## 2017-07-14 NOTE — Telephone Encounter (Signed)
Patient was called and informed of lab results via [phone])

## 2017-07-22 MED FILL — ATORVASTATIN 20 MG TABLET: 20 | 30 days supply | Qty: 30 | Fill #2

## 2017-07-22 MED FILL — LISINOPRIL-HCTZ 20-25 MG TA: 20-25 | 30 days supply | Qty: 30 | Fill #1

## 2017-08-15 ENCOUNTER — Other Ambulatory Visit: Payer: Self-pay | Admitting: Internal Medicine

## 2017-08-15 MED FILL — TRUE METRIX TEST STRIP: 30 days supply | Qty: 100 | Fill #3

## 2017-08-15 MED FILL — TRUEplus LANCETS 28G MISC: 30 days supply | Qty: 100 | Fill #0

## 2017-08-15 MED FILL — LISINOPRIL-HCTZ 20-25 MG TA: 20-25 | 30 days supply | Qty: 30 | Fill #2

## 2017-08-15 MED FILL — ATORVASTATIN 20 MG TABLET: 20 | 30 days supply | Qty: 30 | Fill #3

## 2017-08-15 NOTE — Telephone Encounter (Signed)
Patient last seen 06/23/17. Refilled lancets today.

## 2017-09-19 MED FILL — ?METFORMIN HCL 500MG TABS: 500 | 30 days supply | Qty: 15 | Fill #1

## 2017-09-30 MED FILL — ATORVASTATIN 20 MG TABLET: 20 | 30 days supply | Qty: 30 | Fill #0

## 2017-10-08 MED FILL — LISINOPRIL-HCTZ 20-25 MG TA: 20-25 | 30 days supply | Qty: 30 | Fill #3

## 2017-10-29 ENCOUNTER — Other Ambulatory Visit: Payer: Self-pay | Admitting: Family Medicine

## 2017-10-29 MED FILL — ?METFORMIN HCL 500MG TABS: 500 | 30 days supply | Qty: 15 | Fill #2

## 2017-10-29 MED FILL — TRUE METRIX TEST STRIP: 25 days supply | Qty: 100 | Fill #0

## 2017-11-04 MED FILL — LISINOPRIL-HCTZ 20-25 MG TA: 20-25 | 30 days supply | Qty: 30 | Fill #4

## 2017-11-04 MED FILL — ATORVASTATIN 20 MG TABLET: 20 | 30 days supply | Qty: 30 | Fill #1

## 2017-11-19 ENCOUNTER — Ambulatory Visit: Payer: Self-pay | Attending: Family Medicine

## 2017-12-08 MED FILL — ?LISINOPRIL-HCTZ 20/25 TAB: 20-25 | 30 days supply | Qty: 30 | Fill #5

## 2017-12-08 MED FILL — ?METFORMIN HCL 500MG TABS: 500 | 30 days supply | Qty: 15 | Fill #3

## 2017-12-18 MED FILL — ATORVASTATIN 20 MG TABLET: 20 | 30 days supply | Qty: 30 | Fill #2

## 2018-01-15 ENCOUNTER — Ambulatory Visit: Payer: Self-pay | Attending: Family Medicine | Admitting: Family Medicine

## 2018-01-15 ENCOUNTER — Other Ambulatory Visit: Payer: Self-pay

## 2018-01-15 ENCOUNTER — Encounter: Payer: Self-pay | Admitting: Family Medicine

## 2018-01-15 VITALS — BP 133/87 | HR 80 | Temp 98.1°F | Resp 16 | Wt 163.4 lb

## 2018-01-15 DIAGNOSIS — E118 Type 2 diabetes mellitus with unspecified complications: Secondary | ICD-10-CM | POA: Insufficient documentation

## 2018-01-15 DIAGNOSIS — E119 Type 2 diabetes mellitus without complications: Secondary | ICD-10-CM

## 2018-01-15 DIAGNOSIS — Z79899 Other long term (current) drug therapy: Secondary | ICD-10-CM | POA: Insufficient documentation

## 2018-01-15 DIAGNOSIS — Z7984 Long term (current) use of oral hypoglycemic drugs: Secondary | ICD-10-CM | POA: Insufficient documentation

## 2018-01-15 DIAGNOSIS — I10 Essential (primary) hypertension: Secondary | ICD-10-CM | POA: Insufficient documentation

## 2018-01-15 LAB — POCT CBG (FASTING - GLUCOSE)-MANUAL ENTRY: GLUCOSE FASTING, POC: 135 mg/dL — AB (ref 70–99)

## 2018-01-15 LAB — POCT GLYCOSYLATED HEMOGLOBIN (HGB A1C): HEMOGLOBIN A1C: 6.2 % — AB (ref 4.0–5.6)

## 2018-01-15 MED ORDER — ATORVASTATIN CALCIUM 20 MG PO TABS: 20.0000 mg | ORAL_TABLET | Freq: Every day | ORAL | 6 refills | Status: DC

## 2018-01-15 MED ORDER — METFORMIN HCL 500 MG PO TABS: 250.0000 mg | ORAL_TABLET | Freq: Every day | ORAL | 6 refills | Status: DC

## 2018-01-15 MED ORDER — LISINOPRIL-HYDROCHLOROTHIAZIDE 20-25 MG PO TABS: 1.0000 | ORAL_TABLET | Freq: Every day | ORAL | 6 refills | Status: DC

## 2018-01-15 MED FILL — ?ATORVASTATIN 20 MG TABLET: 20 | 30 days supply | Qty: 30 | Fill #3

## 2018-01-15 MED FILL — ?METFORMIN HCL 500MG TABS: 500 | 30 days supply | Qty: 15 | Fill #1

## 2018-01-15 MED FILL — LISINOPRIL-HCTZ 20-25 MG TA: 20-25 | 30 days supply | Qty: 30 | Fill #6

## 2018-01-15 NOTE — Progress Notes (Signed)
Subjective:  Patient ID: Billy Gregory, male    DOB: 11-26-1980  Age: 37 y.o. MRN: [id number]  CC: Follow-up   HPI Billy Gregory  is a 10 -year-old male with a history of hypertension, type 2 diabetes mellitus (A1c 6.2) who presents today follow-up visit. He denies hypoglycemia, numbness in extremities and has been compliant with 250 mg of metformin daily.  His eye exam was 2 days ago with [facility] and no evidence of retinopathy. Tolerating his antihypertensive with no complaints of adverse effects.  He is also on a statin.  He adheres to low-sodium, diabetic diet and exercises regularly. He has no additional concerns today.  Past Medical History:  Diagnosis Date  . Diabetes mellitus without complication ([place])   . Hypertension     No past surgical history on file.  No Known Allergies   Outpatient Medications Prior to Visit  Medication Sig Dispense Refill  . acetaminophen (TYLENOL) 325 MG tablet Take 650 mg by mouth every 6 (six) hours as needed.    . cholecalciferol (VITAMIN D) 1000 units tablet Take 5,000 Units by mouth daily.    Marland Kitchen atorvastatin (LIPITOR) 20 MG tablet Take 1 tablet (20 mg total) by mouth daily. 30 tablet 6  . lisinopril-hydrochlorothiazide (PRINZIDE,ZESTORETIC) 20-25 MG tablet Take 1 tablet by mouth daily. 30 tablet 6  . metFORMIN (GLUCOPHAGE) 500 MG tablet Take 0.5 tablets (250 mg total) by mouth daily. 30 tablet 6  . Blood Glucose Monitoring Suppl (TRUE METRIX METER) w/Device KIT USE AS INSTRUCTED 1 kit 0  . glucose blood test strip TEST YOUR BLOOD SUGAR ONCE DAILY IN THE MORNING OR WHEN FEELING BADLY 100 each 2  . TRUEPLUS LANCETS 28G MISC USE TO CHECK BLOOD SUGARS 3 TIMES DAILY 100 each 3   No facility-administered medications prior to visit.     ROS Review of Systems  Constitutional: Negative for activity change and appetite change.  HENT: Negative for sinus pressure and sore throat.   Eyes: Negative for visual disturbance.    Respiratory: Negative for cough, chest tightness and shortness of breath.   Cardiovascular: Negative for chest pain and leg swelling.  Gastrointestinal: Negative for abdominal distention, abdominal pain, constipation and diarrhea.  Endocrine: Negative.   Genitourinary: Negative for dysuria.  Musculoskeletal: Negative for joint swelling and myalgias.  Skin: Negative for rash.  Allergic/Immunologic: Negative.   Neurological: Negative for weakness, light-headedness and numbness.  Psychiatric/Behavioral: Negative for dysphoric mood and suicidal ideas.    Objective:  BP 133/87   Pulse 80   Temp 98.1 F (36.7 C)   Resp 16   Wt 163 lb 6.4 oz (74.1 kg)   SpO2 100%   BMI 28.05 kg/m   BP/Weight 01/15/2018 06/23/2017 08/65/7846  Systolic BP 962 952 841  Diastolic BP 87 91 76  Wt. (Lbs) 163.4 160.4 159  BMI 28.05 27.53 27.29      Physical Exam  Constitutional: He is oriented to person, place, and time. He appears well-developed and well-nourished.  HENT:  Right Ear: External ear normal.  Left Ear: External ear normal.  Mouth/Throat: Oropharynx is clear and moist.  Neck: No JVD present.  Cardiovascular: Normal rate, normal heart sounds and intact distal pulses.  No murmur heard. Pulmonary/Chest: Effort normal and breath sounds normal. He has no wheezes. He has no rales. He exhibits no tenderness.  Abdominal: Soft. Bowel sounds are normal. He exhibits no distension and no mass. There is no tenderness.  Musculoskeletal: Normal range of motion.  Neurological: He is  alert and oriented to person, place, and time.  Skin: Skin is warm and dry.    CMP Latest Ref Rng & Units 06/27/2017 12/20/2016 06/11/2016  Glucose 65 - 99 mg/dL 108(H) 132(H) 106(H)  BUN 6 - 20 mg/dL 21(H) 18 17  Creatinine 0.76 - 1.27 mg/dL 0.97 0.72(L) 0.83  Sodium 134 - 144 mmol/L 140 137 138  Potassium 3.5 - 5.2 mmol/L 4.3 4.6 4.4  Chloride 96 - 106 mmol/L 101 99 105  CO2 20 - 29 mmol/L 25 23 23   Calcium 8.7 - 10.2  mg/dL 9.3 9.7 9.6  Total Protein 6.0 - 8.5 g/dL 7.3 - -  Total Bilirubin 0.0 - 1.2 mg/dL 1.5(H) - -  Alkaline Phos 39 - 117 IU/L 47 - -  AST 0 - 40 IU/L 20 - -  ALT 0 - 44 IU/L 26 - -    Lipid Panel     Component Value Date/Time   CHOL 109 06/27/2017 0835   TRIG 74 06/27/2017 0835   HDL 36 (L) 06/27/2017 0835   CHOLHDL 3.0 06/27/2017 0835   CHOLHDL 4.1 06/11/2016 1441   VLDL 26 06/11/2016 1441   LDLCALC 58 06/27/2017 0835    Lab Results  Component Value Date   HGBA1C 6.2 (A) 01/15/2018    Assessment & Plan:   1. Type 2 diabetes mellitus with complication, without long-term current use of insulin (HCC) Controlled with A1c of 6.2 - Glucose (CBG), Fasting - HgB A1c  2. Type 2 diabetes mellitus without complication, without long-term current use of insulin (HCC) Controlled Continue diabetic diet Up-to-date on annual eye exam Continue lifestyle modifications - atorvastatin (LIPITOR) 20 MG tablet; Take 1 tablet (20 mg total) by mouth daily.  Dispense: 30 tablet; Refill: 6 - metFORMIN (GLUCOPHAGE) 500 MG tablet; Take 0.5 tablets (250 mg total) by mouth daily.  Dispense: 30 tablet; Refill: 6  3. HTN (hypertension), benign Controlled Counseled on blood pressure goal of less than 130/80, low-sodium, DASH diet, medication compliance, 150 minutes of moderate intensity exercise per week. Discussed medication compliance, adverse effects. - lisinopril-hydrochlorothiazide (PRINZIDE,ZESTORETIC) 20-25 MG tablet; Take 1 tablet by mouth daily.  Dispense: 30 tablet; Refill: 6   Meds ordered this encounter  Medications  . atorvastatin (LIPITOR) 20 MG tablet    Sig: Take 1 tablet (20 mg total) by mouth daily.    Dispense:  30 tablet    Refill:  6  . lisinopril-hydrochlorothiazide (PRINZIDE,ZESTORETIC) 20-25 MG tablet    Sig: Take 1 tablet by mouth daily.    Dispense:  30 tablet    Refill:  6  . metFORMIN (GLUCOPHAGE) 500 MG tablet    Sig: Take 0.5 tablets (250 mg total) by mouth  daily.    Dispense:  30 tablet    Refill:  6    Follow-up: Return in about 6 months (around 07/17/2018) for follow up of chronic medical conditions.   Charlott Rakes MD

## 2018-01-15 NOTE — Progress Notes (Signed)
F/u DM HTN

## 2018-02-18 MED FILL — ?ATORVASTATIN 20 MG TABLET: 20 | 30 days supply | Qty: 30 | Fill #4

## 2018-04-01 MED FILL — ?ATORVASTATIN 20 MG TABLET: 20 | 30 days supply | Qty: 30 | Fill #4

## 2018-04-01 MED FILL — LISINOPRIL-HCTZ 20-25 MG TA: 20-25 | 30 days supply | Qty: 30 | Fill #0

## 2018-05-25 MED FILL — ?ATORVASTATIN 20 MG TABLET: 20 | 30 days supply | Qty: 30 | Fill #5

## 2018-05-25 MED FILL — ?METFORMIN HCL 500MG TABL: 500 | 30 days supply | Qty: 30 | Fill #0

## 2018-05-25 MED FILL — LISINOPRIL-HCTZ 20-25 MG TA: 20-25 | 30 days supply | Qty: 30 | Fill #1

## 2018-09-07 ENCOUNTER — Other Ambulatory Visit: Payer: Self-pay | Admitting: Family Medicine

## 2019-02-08 ENCOUNTER — Ambulatory Visit: Payer: Self-pay | Attending: Family Medicine | Admitting: Family Medicine

## 2019-02-08 ENCOUNTER — Encounter: Payer: Self-pay | Admitting: Family Medicine

## 2019-02-08 ENCOUNTER — Other Ambulatory Visit: Payer: Self-pay

## 2019-02-08 VITALS — BP 159/100 | HR 82 | Temp 98.2°F | Ht 64.0 in | Wt 169.6 lb

## 2019-02-08 DIAGNOSIS — E119 Type 2 diabetes mellitus without complications: Secondary | ICD-10-CM

## 2019-02-08 DIAGNOSIS — I10 Essential (primary) hypertension: Secondary | ICD-10-CM

## 2019-02-08 DIAGNOSIS — R253 Fasciculation: Secondary | ICD-10-CM

## 2019-02-08 LAB — POCT GLYCOSYLATED HEMOGLOBIN (HGB A1C): HbA1c, POC (controlled diabetic range): 6.6 % (ref 0.0–7.0)

## 2019-02-08 LAB — GLUCOSE, POCT (MANUAL RESULT ENTRY): POC Glucose: 131 mg/dl — AB (ref 70–99)

## 2019-02-08 MED ORDER — TRUEPLUS LANCETS 28G MISC: 6 refills | Status: DC

## 2019-02-08 MED ORDER — TRUE METRIX METER W/DEVICE KIT: PACK | 0 refills | Status: AC

## 2019-02-08 MED ORDER — GLUCOSE BLOOD VI STRP: ORAL_STRIP | 6 refills | Status: DC

## 2019-02-08 MED ORDER — LISINOPRIL-HYDROCHLOROTHIAZIDE 20-25 MG PO TABS: 1.0000 | ORAL_TABLET | Freq: Every day | ORAL | 6 refills | Status: DC

## 2019-02-08 MED ORDER — METFORMIN HCL 500 MG PO TABS: 250.0000 mg | ORAL_TABLET | Freq: Every day | ORAL | 6 refills | Status: DC

## 2019-02-08 MED ORDER — ATORVASTATIN CALCIUM 20 MG PO TABS: 20.0000 mg | ORAL_TABLET | Freq: Every day | ORAL | 6 refills | Status: DC

## 2019-02-08 MED FILL — metFORMIN HCL 500 MG TABS: 500 | 30 days supply | Qty: 15 | Fill #0

## 2019-02-08 MED FILL — !TRUE METRIX BLOOD GLUCOSE: 365 days supply | Qty: 1 | Fill #0

## 2019-02-08 MED FILL — TRUE METRIX TEST STRIP: 25 days supply | Qty: 100 | Fill #0

## 2019-02-08 MED FILL — LISINOPRIL-HCTZ 20-25 MG TA: 20-25 | 30 days supply | Qty: 30 | Fill #0

## 2019-02-08 MED FILL — ATORVASTATIN CALCIUM 20 MG: 20 | 30 days supply | Qty: 30 | Fill #0

## 2019-02-08 MED FILL — TRUEplus LANCETS 28G MISC: 25 days supply | Qty: 100 | Fill #0

## 2019-02-08 NOTE — Patient Instructions (Signed)
Hipertensin en los adultos Hypertension, Adult El trmino hipertensin es otra forma de denominar a la presin arterial elevada. La presin arterial elevada fuerza al corazn a trabajar ms para bombear la sangre. Esto puede causar problemas con el paso del tiempo. Una lectura de presin arterial est compuesta por 2 nmeros. Hay un nmero superior (sistlico) sobre un nmero inferior (diastlico). Lo ideal es tener la presin arterial por debajo de 120/80. Las elecciones saludables pueden ayudar a bajar la presin arterial, o tal vez necesite medicamentos para bajarla. Cules son las causas? Se desconoce la causa de esta afeccin. Algunas afecciones pueden estar relacionadas con la presin arterial alta. Qu incrementa el riesgo?  Fumar.  Tener diabetes mellitus tipo 2, colesterol alto, o ambos.  No hacer la cantidad suficiente de actividad fsica o ejercicio.  Tener sobrepeso.  Consumir mucha grasa, azcar, caloras o sal (sodio) en su dieta.  Beber alcohol en exceso.  Tener una enfermedad renal a largo plazo (crnica).  Tener antecedentes familiares de presin arterial alta.  Edad. Los riesgos aumentan con la edad.  Raza. El riesgo es mayor para las personas afroamericanas.  Sexo. Antes de los 45aos, los hombres corren ms riesgo que las mujeres. Despus de los 65aos, las mujeres corren ms riesgo que los hombres.  Tener apnea obstructiva del sueo.  Estrs. Cules son los signos o los sntomas?  Es posible que la presin arterial alta puede no cause sntomas. La presin arterial muy alta (crisis hipertensiva) puede provocar: ? Dolor de cabeza. ? Sensaciones de preocupacin o nerviosismo (ansiedad). ? Falta de aire. ? Hemorragia nasal. ? Sensacin de malestar en el estmago (nuseas). ? Vmitos. ? Cambios en la forma de ver. ? Dolor muy intenso en el pecho. ? Convulsiones. Cmo se trata?  Esta afeccin se trata haciendo cambios saludables en el estilo de  vida, por ejemplo: ? Consumir alimentos saludables. ? Hacer ms ejercicio. ? Beber menos alcohol.  El mdico puede recetarle medicamentos si los cambios en el estilo de vida no son suficientes para lograr controlar la presin arterial y si: ? El nmero de arriba est por encima de 130. ? El nmero de abajo est por encima de 80.  Su presin arterial personal ideal puede variar. Siga estas instrucciones en su casa: Comida y bebida   Si se lo dicen, siga el plan de alimentacin de DASH (Dietary Approaches to Stop Hypertension, Maneras de alimentarse para detener la hipertensin). Para seguir este plan: ? Llene la mitad del plato de cada comida con frutas y verduras. ? Llene un cuarto del plato de cada comida con cereales integrales. Los cereales integrales incluyen pasta integral, arroz integral y pan integral. ? Coma y beba productos lcteos con bajo contenido de grasa, como leche descremada o yogur bajo en grasas. ? Llene un cuarto del plato de cada comida con protenas bajas en grasa (magras). Las protenas bajas en grasa incluyen pescado, pollo sin piel, huevos, frijoles y tofu. ? Evite consumir carne grasa, carne curada y procesada, o pollo con piel. ? Evite consumir alimentos prehechos o procesados.  Consuma menos de 1500 mg de sal por da.  No beba alcohol si: ? El mdico le indica que no lo haga. ? Est embarazada, puede estar embarazada o est tratando de quedar embarazada.  Si bebe alcohol: ? Limite la cantidad que bebe a lo siguiente:  De 0 a 1 medida por da para las mujeres.  De 0 a 2 medidas por da para los hombres. ? Est atento a   la cantidad de alcohol que hay en las bebidas que toma. En [place], una medida equivale a una botella de cerveza de 12oz (355ml), un vaso de vino de 5oz (148ml) o un vaso de una bebida alcohlica de alta graduacin de 1oz (44ml). Estilo de vida   Trabaje con su mdico para mantenerse en un peso saludable o para perder  peso. Pregntele a su mdico cul es el peso recomendable para usted.  Haga al menos 30minutos de ejercicio la mayora de los das de la semana. Estos pueden incluir caminar, nadar o andar en bicicleta.  Realice al menos 30 minutos de ejercicio que fortalezca sus msculos (ejercicios de resistencia) al menos 3 das a la semana. Estos pueden incluir levantar pesas o hacer Pilates.  No consuma ningn producto que contenga nicotina o tabaco, como cigarrillos, cigarrillos electrnicos y tabaco de mascar. Si necesita ayuda para dejar de fumar, consulte al mdico.  Controle su presin arterial en su casa tal como le indic el mdico.  Concurra a todas las visitas de seguimiento como se lo haya indicado el mdico. Esto es importante. Medicamentos  Tome los medicamentos de venta libre y los recetados solamente como se lo haya indicado el mdico. Siga cuidadosamente las indicaciones.  No omita las dosis de medicamentos para la presin arterial. Los medicamentos pierden eficacia si omite dosis. El hecho de omitir las dosis tambin aumenta el riesgo de otros problemas.  Pregntele a su mdico a qu efectos secundarios o reacciones a los medicamentos debe prestar atencin. Comunquese con un mdico si:  Piensa que tiene una reaccin a los medicamentos que est tomando.  Tiene dolores de cabeza frecuentes (recurrentes).  Se siente mareado.  Tiene hinchazn en los tobillos.  Tiene problemas de visin. Solicite ayuda inmediatamente si:  Siente un dolor de cabeza muy intenso.  Empieza a sentirse desorientado (confundido).  Se siente dbil o adormecido.  Siente que va a desmayarse.  Tiene un dolor muy intenso en las siguientes zonas: ? Pecho. ? Vientre (abdomen).  Vomita ms de una vez.  Tiene dificultad para respirar. Resumen  El trmino hipertensin es otra forma de denominar a la presin arterial elevada.  La presin arterial elevada fuerza al corazn a trabajar ms para bombear  la sangre.  Para la mayora de las personas, una presin arterial normal es menor que 120/80.  Las decisiones saludables pueden ayudarle a disminuir su presin arterial. Si no puede bajar su presin arterial mediante decisiones saludables, es posible que deba tomar medicamentos. Esta informacin no tiene como fin reemplazar el consejo del mdico. Asegrese de hacerle al mdico cualquier pregunta que tenga. Document Released: 08/29/2009 Document Revised: 12/25/2017 Document Reviewed: 12/25/2017 Elsevier Patient Education  2020 Elsevier Inc.  

## 2019-02-08 NOTE — Progress Notes (Signed)
Patient states that he is having a sensation in his arms for about 2 weeks.  No medication in 4 months.

## 2019-02-09 LAB — LIPID PANEL
Chol/HDL Ratio: 5.3 ratio — ABNORMAL HIGH (ref 0.0–5.0)
Cholesterol, Total: 179 mg/dL (ref 100–199)
HDL: 34 mg/dL — ABNORMAL LOW (ref >39)
LDL Chol Calc (NIH): 118 mg/dL — ABNORMAL HIGH (ref 0–99)
Triglycerides: 153 mg/dL — ABNORMAL HIGH (ref 0–149)
VLDL Cholesterol Cal: 27 mg/dL (ref 5–40)

## 2019-02-09 LAB — CMP14+EGFR
ALT: 38 IU/L (ref 0–44)
AST: 27 IU/L (ref 0–40)
Albumin/Globulin Ratio: 1.8 (ref 1.2–2.2)
Albumin: 4.9 g/dL (ref 4.0–5.0)
Alkaline Phosphatase: 59 IU/L (ref 39–117)
BUN/Creatinine Ratio: 15 (ref 9–20)
BUN: 14 mg/dL (ref 6–20)
Bilirubin Total: 1.7 mg/dL — ABNORMAL HIGH (ref 0.0–1.2)
CO2: 23 mmol/L (ref 20–29)
Calcium: 9.7 mg/dL (ref 8.7–10.2)
Chloride: 103 mmol/L (ref 96–106)
Creatinine, Ser: 0.96 mg/dL (ref 0.76–1.27)
GFR calc Af Amer: 115 mL/min/{1.73_m2} (ref >59)
GFR calc non Af Amer: 99 mL/min/{1.73_m2} (ref >59)
Globulin, Total: 2.8 g/dL (ref 1.5–4.5)
Glucose: 134 mg/dL — ABNORMAL HIGH (ref 65–99)
Potassium: 4.9 mmol/L (ref 3.5–5.2)
Sodium: 140 mmol/L (ref 134–144)
Total Protein: 7.7 g/dL (ref 6.0–8.5)

## 2019-02-09 LAB — MICROALBUMIN / CREATININE URINE RATIO
Creatinine, Urine: 215.5 mg/dL
Microalb/Creat Ratio: 40 mg/g creat — ABNORMAL HIGH (ref 0–29)
Microalbumin, Urine: 85.8 ug/mL

## 2019-02-09 NOTE — Progress Notes (Signed)
Subjective:  Patient ID: Billy Gregory, male    DOB: 10/26/1980  Age: 38 y.o. MRN: [id number]  CC: Diabetes   HPI Billy Gregory is a 38 year old male with a history of type 2 diabetes mellitus (A1c 6.6), hypertension who presents today for a follow-up visit.  He has been out of his medication for the last 4 months due to not having an appointment. He is very active at his job where he gets most of his exercise. With regards to his diabetes mellitus he denies hypoglycemia, numbness in extremities or visual concerns.  States he had an eye exam last year and was informed he had no diabetic retinopathy. He has been out of his antihypertensive.  He does complain of intermittent arm sensations which he describes as twitching which are not precipitated by activity.  He denies numbness, paresthesias or pins-and-needles sensation.  Denies presence of arm weakness and symptoms are absent at this time.  Past Medical History:  Diagnosis Date  . Diabetes mellitus without complication ([place])   . Hypertension     History reviewed. No pertinent surgical history.  Family History  Problem Relation Age of Onset  . Diabetes Paternal Aunt     No Known Allergies  Outpatient Medications Prior to Visit  Medication Sig Dispense Refill  . acetaminophen (TYLENOL) 325 MG tablet Take 650 mg by mouth every 6 (six) hours as needed.    . cholecalciferol (VITAMIN D) 1000 units tablet Take 5,000 Units by mouth daily.    Marland Kitchen atorvastatin (LIPITOR) 20 MG tablet Take 1 tablet (20 mg total) by mouth daily. (Patient not taking: Reported on 02/08/2019) 30 tablet 6  . Blood Glucose Monitoring Suppl (TRUE METRIX METER) w/Device KIT USE AS INSTRUCTED (Patient not taking: Reported on 02/08/2019) 1 kit 0  . glucose blood test strip TEST YOUR BLOOD SUGAR ONCE DAILY IN THE MORNING OR WHEN FEELING BADLY (Patient not taking: Reported on 02/08/2019) 100 each 2  . lisinopril-hydrochlorothiazide  (PRINZIDE,ZESTORETIC) 20-25 MG tablet Take 1 tablet by mouth daily. (Patient not taking: Reported on 02/08/2019) 30 tablet 6  . metFORMIN (GLUCOPHAGE) 500 MG tablet Take 0.5 tablets (250 mg total) by mouth daily. (Patient not taking: Reported on 02/08/2019) 30 tablet 6  . TRUEplus Lancets 28G MISC USE TO CHECK BLOOD SUGARS THREE TIMES DAILY. MUST MAKE APPT FOR FURTHER REFILLS (Patient not taking: Reported on 02/08/2019) 100 each 0   No facility-administered medications prior to visit.      ROS Review of Systems  Constitutional: Negative for activity change and appetite change.  HENT: Negative for sinus pressure and sore throat.   Eyes: Negative for visual disturbance.  Respiratory: Negative for cough, chest tightness and shortness of breath.   Cardiovascular: Negative for chest pain and leg swelling.  Gastrointestinal: Negative for abdominal distention, abdominal pain, constipation and diarrhea.  Endocrine: Negative.   Genitourinary: Negative for dysuria.  Musculoskeletal: Negative for joint swelling and myalgias.  Skin: Negative for rash.  Allergic/Immunologic: Negative.   Neurological: Negative for weakness, light-headedness and numbness.  Psychiatric/Behavioral: Negative for dysphoric mood and suicidal ideas.    Objective:  BP (!) 159/100   Pulse 82   Temp 98.2 F (36.8 C) (Oral)   Ht 5' 4"  (1.626 m)   Wt 169 lb 9.6 oz (76.9 kg)   SpO2 100%   BMI 29.11 kg/m   BP/Weight 02/08/2019 22/97/9892 03/26/9415  Systolic BP 408 144 818  Diastolic BP 563 87 91  Wt. (Lbs) 169.6 163.4 160.4  BMI 29.11 28.05  27.53      Physical Exam Constitutional:      Appearance: He is well-developed.  Neck:     Vascular: No JVD.  Cardiovascular:     Rate and Rhythm: Normal rate.     Heart sounds: Normal heart sounds. No murmur.  Pulmonary:     Effort: Pulmonary effort is normal.     Breath sounds: Normal breath sounds. No wheezing or rales.  Chest:     Chest wall: No tenderness.   Abdominal:     General: Bowel sounds are normal. There is no distension.     Palpations: Abdomen is soft. There is no mass.     Tenderness: There is no abdominal tenderness.  Musculoskeletal: Normal range of motion.     Right lower leg: No edema.     Left lower leg: No edema.  Neurological:     Mental Status: He is alert and oriented to person, place, and time.  Psychiatric:        Mood and Affect: Mood normal.     CMP Latest Ref Rng & Units 02/08/2019 06/27/2017 12/20/2016  Glucose 65 - 99 mg/dL 134(H) 108(H) 132(H)  BUN 6 - 20 mg/dL 14 21(H) 18  Creatinine 0.76 - 1.27 mg/dL 0.96 0.97 0.72(L)  Sodium 134 - 144 mmol/L 140 140 137  Potassium 3.5 - 5.2 mmol/L 4.9 4.3 4.6  Chloride 96 - 106 mmol/L 103 101 99  CO2 20 - 29 mmol/L 23 25 23   Calcium 8.7 - 10.2 mg/dL 9.7 9.3 9.7  Total Protein 6.0 - 8.5 g/dL 7.7 7.3 -  Total Bilirubin 0.0 - 1.2 mg/dL 1.7(H) 1.5(H) -  Alkaline Phos 39 - 117 IU/L 59 47 -  AST 0 - 40 IU/L 27 20 -  ALT 0 - 44 IU/L 38 26 -    Lipid Panel     Component Value Date/Time   CHOL 179 02/08/2019 1016   TRIG 153 (H) 02/08/2019 1016   HDL 34 (L) 02/08/2019 1016   CHOLHDL 5.3 (H) 02/08/2019 1016   CHOLHDL 4.1 06/11/2016 1441   VLDL 26 06/11/2016 1441   LDLCALC 118 (H) 02/08/2019 1016    CBC    Component Value Date/Time   WBC 9.2 09/19/2015 1719   RBC 5.08 09/19/2015 1719   HGB 14.9 09/19/2015 1719   HCT 41.9 09/19/2015 1719   PLT 228 09/19/2015 1719   MCV 82.5 09/19/2015 1719   MCH 29.3 09/19/2015 1719   MCHC 35.6 09/19/2015 1719   RDW 13.8 09/19/2015 1719   LYMPHSABS 2,668 09/19/2015 1719   MONOABS 552 09/19/2015 1719   EOSABS 276 09/19/2015 1719   BASOSABS 92 09/19/2015 1719    Lab Results  Component Value Date   HGBA1C 6.6 02/08/2019    Assessment & Plan:   1. Type 2 diabetes mellitus without complication, without long-term current use of insulin (HCC) Controlled with A1c of 6.6 Continue current regimen Counseled on Diabetic diet, my  plate method, 209 minutes of moderate intensity exercise/week Blood sugar logs with fasting goals of 80-120 mg/dl, random of less than 180 and in the event of sugars less than 60 mg/dl or greater than 400 mg/dl encouraged to notify the clinic. Advised on the need for annual eye exams, annual foot exams, Pneumonia vaccine. - Glucose (CBG) - HgB A1c - Blood Glucose Monitoring Suppl (TRUE METRIX METER) w/Device KIT; USE AS INSTRUCTED  Dispense: 1 kit; Refill: 0 - Complete Metabolic Panel with GFR - Lipid panel - Microalbumin/Creatinine Ratio, Urine -  TRUEplus Lancets 28G MISC; USE TO CHECK BLOOD SUGARS DAILY.  Dispense: 100 each; Refill: 6 - metFORMIN (GLUCOPHAGE) 500 MG tablet; Take 0.5 tablets (250 mg total) by mouth daily.  Dispense: 30 tablet; Refill: 6 - glucose blood test strip; TEST YOUR BLOOD SUGAR ONCE DAILY IN THE MORNING  Dispense: 100 each; Refill: 6 - atorvastatin (LIPITOR) 20 MG tablet; Take 1 tablet (20 mg total) by mouth daily.  Dispense: 30 tablet; Refill: 6  2. HTN (hypertension), benign Uncontrolled due to running out of antihypertensive which I have refilled Counseled on blood pressure goal of less than 130/80, low-sodium, DASH diet, medication compliance, 150 minutes of moderate intensity exercise per week. Discussed medication compliance, adverse effects. - lisinopril-hydrochlorothiazide (ZESTORETIC) 20-25 MG tablet; Take 1 tablet by mouth daily.  Dispense: 30 tablet; Refill: 6  3.  Twitching This is not evident during my exam and his symptoms are distinctly different from neuropathy We will electrolytes Patient reassured as this could be due to excitation of his nerves.  Meds ordered this encounter  Medications  . Blood Glucose Monitoring Suppl (TRUE METRIX METER) w/Device KIT    Sig: USE AS INSTRUCTED    Dispense:  1 kit    Refill:  0  . TRUEplus Lancets 28G MISC    Sig: USE TO CHECK BLOOD SUGARS DAILY.    Dispense:  100 each    Refill:  6  . metFORMIN  (GLUCOPHAGE) 500 MG tablet    Sig: Take 0.5 tablets (250 mg total) by mouth daily.    Dispense:  30 tablet    Refill:  6  . lisinopril-hydrochlorothiazide (ZESTORETIC) 20-25 MG tablet    Sig: Take 1 tablet by mouth daily.    Dispense:  30 tablet    Refill:  6  . glucose blood test strip    Sig: TEST YOUR BLOOD SUGAR ONCE DAILY IN THE MORNING    Dispense:  100 each    Refill:  6  . atorvastatin (LIPITOR) 20 MG tablet    Sig: Take 1 tablet (20 mg total) by mouth daily.    Dispense:  30 tablet    Refill:  6    Follow-up: Return in about 3 months (around 05/11/2019) for medical conditions.       Charlott Rakes, MD, FAAFP. [facility] [place], [place]   02/09/2019, 7:58 AM

## 2019-02-10 ENCOUNTER — Other Ambulatory Visit: Payer: Self-pay | Admitting: Family Medicine

## 2019-02-15 ENCOUNTER — Other Ambulatory Visit: Payer: Self-pay

## 2019-02-15 ENCOUNTER — Ambulatory Visit: Payer: Self-pay | Attending: Family Medicine

## 2019-02-26 ENCOUNTER — Telehealth: Payer: Self-pay

## 2019-02-26 NOTE — Telephone Encounter (Signed)
Patient returned phone call and was informed of lab results and ultrasound appointment.

## 2019-02-26 NOTE — Telephone Encounter (Signed)
Patient returned call and was informed of lab results. Patient was also informed of his RUQ appt.

## 2019-02-26 NOTE — Telephone Encounter (Signed)
Patient was called and a voicemail was left informing patient to return phone call for lab results and ultrasound appointment.

## 2019-02-26 NOTE — Telephone Encounter (Signed)
-----   Message from Charlott Rakes, MD sent at 02/10/2019  1:11 PM EST ----- His labs reveal normal cholesterol and kidney function but one of the liver enzymes is elevated. Please schedule a RUQ sonogram for him. Thanks

## 2019-03-05 ENCOUNTER — Other Ambulatory Visit: Payer: Self-pay

## 2019-03-05 ENCOUNTER — Ambulatory Visit (HOSPITAL_COMMUNITY)
Admission: RE | Admit: 2019-03-05 | Discharge: 2019-03-05 | Disposition: A | Discharge status: Home / Self Care | Payer: Self-pay | Source: Ambulatory Visit | Attending: Family Medicine | Admitting: Family Medicine

## 2019-04-01 ENCOUNTER — Telehealth: Payer: Self-pay | Admitting: Family Medicine

## 2019-04-01 NOTE — Telephone Encounter (Signed)
I received an email from [employer] PATIENT HAS 2 OTHER CHECKING ACCOUNTS PATIENT NEEDS TO PROVIDE 90 DAYS OF COMPLETE BANK STATEMENT FOR ACCOUNT ENDING IN [id number] AND ACCOUNT ENDING [id number] WILL HOLD CAFA APPLICATION FOR 14 DAYS FOR PATIENT TO PROVIDE REQUESTED INFORMATION TO COMPLETE CAFA APPLICATION, LVM to Pt to get [place] the documents we need to complete the application other it will be denied for 6 month and he has any question to please call the office

## 2019-06-29 ENCOUNTER — Ambulatory Visit: Payer: Self-pay | Attending: Family Medicine | Admitting: Family Medicine

## 2019-06-29 ENCOUNTER — Encounter: Payer: Self-pay | Admitting: Family Medicine

## 2019-06-29 ENCOUNTER — Other Ambulatory Visit: Payer: Self-pay

## 2019-06-29 ENCOUNTER — Other Ambulatory Visit: Payer: Self-pay | Admitting: Family Medicine

## 2019-06-29 VITALS — BP 152/81 | HR 81 | Ht 64.0 in | Wt 164.0 lb

## 2019-06-29 DIAGNOSIS — K760 Fatty (change of) liver, not elsewhere classified: Secondary | ICD-10-CM

## 2019-06-29 DIAGNOSIS — I10 Essential (primary) hypertension: Secondary | ICD-10-CM

## 2019-06-29 DIAGNOSIS — R253 Fasciculation: Secondary | ICD-10-CM

## 2019-06-29 DIAGNOSIS — E119 Type 2 diabetes mellitus without complications: Secondary | ICD-10-CM

## 2019-06-29 LAB — POCT GLYCOSYLATED HEMOGLOBIN (HGB A1C): HbA1c, POC (controlled diabetic range): 6.2 % (ref 0.0–7.0)

## 2019-06-29 LAB — GLUCOSE, POCT (MANUAL RESULT ENTRY): POC Glucose: 128 mg/dl — AB (ref 70–99)

## 2019-06-29 MED ORDER — METFORMIN HCL 500 MG PO TABS: 250.0000 mg | ORAL_TABLET | Freq: Every day | ORAL | 6 refills | Status: DC

## 2019-06-29 MED ORDER — ATORVASTATIN CALCIUM 20 MG PO TABS: 20.0000 mg | ORAL_TABLET | Freq: Every day | ORAL | 6 refills | Status: DC

## 2019-06-29 MED ORDER — LISINOPRIL-HYDROCHLOROTHIAZIDE 20-25 MG PO TABS: 1.0000 | ORAL_TABLET | Freq: Every day | ORAL | 6 refills | Status: DC

## 2019-06-29 MED FILL — metFORMIN HCL 500 MG TABS: 500 | 30 days supply | Qty: 15 | Fill #0

## 2019-06-29 MED FILL — LISINOPRIL-HCTZ 20-25 MG TA: 20-25 | 30 days supply | Qty: 30 | Fill #0

## 2019-06-29 MED FILL — ATORVASTATIN CALCIUM 20 MG: 20 | 30 days supply | Qty: 30 | Fill #0

## 2019-06-29 NOTE — Patient Instructions (Signed)
Enfermedad del hgado graso Fatty Liver Disease  La enfermedad del hgado graso ocurre cuando se acumula demasiada grasa en las clulas del hgado. La enfermedad del hgado graso tambin se llama esteatosis heptica o esteatohepatitis. El hgado elimina las sustancias dainas del torrente sanguneo y produce lquidos que el cuerpo necesita. Tambin ayuda al cuerpo a utilizar y almacenar la energa obtenida de los alimentos que come. En muchos casos, la enfermedad del hgado graso no provoca sntomas ni problemas. Con frecuencia, se diagnostica cuando se realizan estudios por otros motivos. Sin embargo, con el tiempo, el hgado graso puede provocar una inflamacin que posiblemente cause problemas hepticos ms graves, como la fibrosis heptica (cirrosis) o insuficiencia heptica. El hgado graso se asocia con la resistencia a la insulina, el aumento de la grasa corporal, la presin arterial alta (hipertensin) y el colesterol elevado. Estas son caractersticas del sndrome metablico y aumentan el riesgo de accidente cerebrovascular, diabetes y enfermedad cardaca. Cules son las causas? Esta afeccin puede ser causada por lo siguiente:  Beber alcohol en exceso.  Mala alimentacin.  Obesidad.  Sndrome de [place].  Diabetes.  Colesterol alto.  Determinados medicamentos.  Txicos.  Algunas infecciones virales.  Embarazo. Qu incrementa el riesgo? Es ms probable que tenga esta afeccin si:  Consume alcohol en exceso.  Tiene sobrepeso.  Tiene diabetes.  Tiene hepatitis.  Tiene un nivel alto de triglicridos.  Est embarazada. Cules son los signos o los sntomas? Con frecuencia, la enfermedad del hgado graso no provoca sntomas. Si se desarrollan sntomas, estos pueden incluir:  Fatiga.  Debilidad.  Prdida de peso.  Confusin.  Dolor abdominal.  Nuseas y vmitos.  Color amarillo en la piel y en la zona blanca de los ojos (ictericia).  Picazn en la  piel. Cmo se diagnostica? Esta afeccin puede diagnosticarse mediante:  Un examen fsico y antecedentes mdicos.  Anlisis de sangre.  Estudios de diagnstico por imgenes, como ecografa, exploracin por tomografa computarizada (TC) o resonancia magntica (RM).  Biopsia de hgado. Se extrae una pequea muestra de tejido del hgado usando una aguja. La muestra se examina en el microscopio. Cmo se trata? Con frecuencia, la enfermedad del hgado graso es causada por otras afecciones. El tratamiento para el hgado graso puede incluir medicamentos y cambios en el estilo de vida para controlar enfermedades como:  Alcoholismo.  Colesterol alto.  Diabetes.  Tener exceso de peso u obesidad. Siga estas indicaciones en su casa:   No beba alcohol. Si tiene problemas para dejar de beber, consulte al mdico cmo puede dejar de beber de forma segura con la ayuda de medicamentos o un programa con supervisin. Esto es importante para evitar que la afeccin empeore.  Siga una dieta saludable segn lo indicado por su mdico. Consulte al mdico sobre trabajar con un especialista en alimentacin y nutricin (nutricionista) a fin de desarrollar un plan de alimentacin.  Haga ejercicio regularmente. Esto puede ayudarlo a bajar de peso, y a controlar el colesterol y la diabetes. Hable con el mdico sobre qu actividades son mejores para usted y elaboren un plan de ejercicios.  Tome los medicamentos de venta libre y los recetados solamente como se lo haya indicado el mdico.  Concurra a todas las visitas de control como se lo haya indicado el mdico. Esto es importante. Comunquese con un mdico si: Tiene dificultad para controlar lo siguiente:  Nivel de azcar en la sangre. Esto es muy importante si tiene diabetes.  El colesterol.  El consumo de alcohol. Solicite ayuda de inmediato si:    Siente dolor abdominal.  Tiene ictericia.  Tiene nuseas y vmitos.  Vomita sangre de color rojo  brillante o una sustancia similar a los granos de caf.  Las heces son negras, alquitranadas o sanguinolentas. Resumen  La enfermedad del hgado graso se desarrolla cuando se acumula demasiada grasa en las clulas del hgado.  Con frecuencia, la enfermedad del hgado no causa sntomas ni problemas. Sin embargo, con el tiempo, el hgado graso puede provocar una inflamacin que puede causar problemas hepticos ms graves, como fibrosis heptica (cirrosis).  Es ms probable que desarrolle esta afeccin si consume alcohol en exceso, est embarazada, tiene sobrepeso, diabetes, hepatitis o altos niveles de triglicridos.  Comunquese con el mdico si tiene problemas para controlar el peso, los niveles de azcar en la sangre, el colesterol o el consumo de alcohol. Esta informacin no tiene como fin reemplazar el consejo del mdico. Asegrese de hacerle al mdico cualquier pregunta que tenga. Document Revised: 03/10/2017 Document Reviewed: 03/10/2017 Elsevier Patient Education  2020 Elsevier Inc.  

## 2019-06-29 NOTE — Progress Notes (Signed)
Subjective:  Patient ID: Billy Gregory, male    DOB: 12-23-1980  Age: 39 y.o. MRN: [id number]  CC: Diabetes   HPI [name]  is a 39 year old male with a history of type 2 diabetes mellitus (A1c 6.2), hypertension who presents today for a follow-up visit  He has had twitching at rest for the last 3 months which occurs in his whole body intermittently more in his R arm.  Denies intake of pain medications, denies presence of pain or neuropathy.  His BP is elevated and he is yet to take his antihypertensive and has also not been taking Metformin or Lipitor as well as he states he does not have insurance to cover his medications. His last bilirubin was elevated at 1.6 in 01/2019; he does not drink alcohol.  Right upper quadrant ultrasound revealed hepatic steatosis and he has been working on losing weight and has lost 5 pounds) since his last visit.  Past Medical History:  Diagnosis Date  . Diabetes mellitus without complication ([place])   . Hypertension     No past surgical history on file.  Family History  Problem Relation Age of Onset  . Diabetes Paternal Aunt     No Known Allergies  Outpatient Medications Prior to Visit  Medication Sig Dispense Refill  . metFORMIN (GLUCOPHAGE) 500 MG tablet Take 0.5 tablets (250 mg total) by mouth daily. 30 tablet 6  . acetaminophen (TYLENOL) 325 MG tablet Take 650 mg by mouth every 6 (six) hours as needed.    Marland Kitchen atorvastatin (LIPITOR) 20 MG tablet Take 1 tablet (20 mg total) by mouth daily. (Patient not taking: Reported on 06/29/2019) 30 tablet 6  . Blood Glucose Monitoring Suppl (TRUE METRIX METER) w/Device KIT USE AS INSTRUCTED (Patient not taking: Reported on 06/29/2019) 1 kit 0  . cholecalciferol (VITAMIN D) 1000 units tablet Take 5,000 Units by mouth daily.    Marland Kitchen glucose blood test strip TEST YOUR BLOOD SUGAR ONCE DAILY IN THE MORNING (Patient not taking: Reported on 06/29/2019) 100 each 6  . lisinopril-hydrochlorothiazide  (ZESTORETIC) 20-25 MG tablet Take 1 tablet by mouth daily. (Patient not taking: Reported on 06/29/2019) 30 tablet 6  . TRUEplus Lancets 28G MISC USE TO CHECK BLOOD SUGARS DAILY. (Patient not taking: Reported on 06/29/2019) 100 each 6   No facility-administered medications prior to visit.     ROS Review of Systems  Constitutional: Negative for activity change and appetite change.  HENT: Negative for sinus pressure and sore throat.   Eyes: Negative for visual disturbance.  Respiratory: Negative for cough, chest tightness and shortness of breath.   Cardiovascular: Negative for chest pain and leg swelling.  Gastrointestinal: Negative for abdominal distention, abdominal pain, constipation and diarrhea.  Endocrine: Negative.   Genitourinary: Negative for dysuria.  Musculoskeletal: Negative for joint swelling and myalgias.  Skin: Negative for rash.  Allergic/Immunologic: Negative.   Neurological: Negative for weakness, light-headedness and numbness.  Psychiatric/Behavioral: Negative for dysphoric mood and suicidal ideas.    Objective:  BP (!) 152/81   Pulse 81   Ht 5' 4"  (1.626 m)   Wt 164 lb (74.4 kg)   SpO2 100%   BMI 28.15 kg/m   BP/Weight 06/29/2019 02/08/2019 66/08/3014  Systolic BP 010 932 355  Diastolic BP 81 732 87  Wt. (Lbs) 164 169.6 163.4  BMI 28.15 29.11 28.05      Physical Exam Constitutional:      Appearance: He is well-developed.  Neck:     Vascular: No JVD.  Cardiovascular:  Rate and Rhythm: Normal rate.     Heart sounds: Normal heart sounds. No murmur.  Pulmonary:     Effort: Pulmonary effort is normal.     Breath sounds: Normal breath sounds. No wheezing or rales.  Chest:     Chest wall: No tenderness.  Abdominal:     General: Bowel sounds are normal. There is no distension.     Palpations: Abdomen is soft. There is no mass.     Tenderness: There is no abdominal tenderness.  Musculoskeletal:        General: Normal range of motion.     Right lower  leg: No edema.     Left lower leg: No edema.  Neurological:     Mental Status: He is alert and oriented to person, place, and time.     Comments: Fasciculations of R triceps muscles noted  Psychiatric:        Mood and Affect: Mood normal.     CMP Latest Ref Rng & Units 02/08/2019 06/27/2017 12/20/2016  Glucose 65 - 99 mg/dL 134(H) 108(H) 132(H)  BUN 6 - 20 mg/dL 14 21(H) 18  Creatinine 0.76 - 1.27 mg/dL 0.96 0.97 0.72(L)  Sodium 134 - 144 mmol/L 140 140 137  Potassium 3.5 - 5.2 mmol/L 4.9 4.3 4.6  Chloride 96 - 106 mmol/L 103 101 99  CO2 20 - 29 mmol/L 23 25 23   Calcium 8.7 - 10.2 mg/dL 9.7 9.3 9.7  Total Protein 6.0 - 8.5 g/dL 7.7 7.3 -  Total Bilirubin 0.0 - 1.2 mg/dL 1.7(H) 1.5(H) -  Alkaline Phos 39 - 117 IU/L 59 47 -  AST 0 - 40 IU/L 27 20 -  ALT 0 - 44 IU/L 38 26 -    Lipid Panel     Component Value Date/Time   CHOL 179 02/08/2019 1016   TRIG 153 (H) 02/08/2019 1016   HDL 34 (L) 02/08/2019 1016   CHOLHDL 5.3 (H) 02/08/2019 1016   CHOLHDL 4.1 06/11/2016 1441   VLDL 26 06/11/2016 1441   LDLCALC 118 (H) 02/08/2019 1016    CBC    Component Value Date/Time   WBC 9.2 09/19/2015 1719   RBC 5.08 09/19/2015 1719   HGB 14.9 09/19/2015 1719   HCT 41.9 09/19/2015 1719   PLT 228 09/19/2015 1719   MCV 82.5 09/19/2015 1719   MCH 29.3 09/19/2015 1719   MCHC 35.6 09/19/2015 1719   RDW 13.8 09/19/2015 1719   LYMPHSABS 2,668 09/19/2015 1719   MONOABS 552 09/19/2015 1719   EOSABS 276 09/19/2015 1719   BASOSABS 92 09/19/2015 1719    Lab Results  Component Value Date   HGBA1C 6.2 06/29/2019    Assessment & Plan:   1. Type 2 diabetes mellitus without complication, without long-term current use of insulin (HCC) Controlled with A1c of 6.2 Despite his noncompliance with Metformin his sugars have been stable We will consider discontinuing Metformin at his next visit A1c trends further  Counseled on Diabetic diet, my plate method, 595 minutes of moderate intensity  exercise/week Blood sugar logs with fasting goals of 80-120 mg/dl, random of less than 180 and in the event of sugars less than 60 mg/dl or greater than 400 mg/dl encouraged to notify the clinic. Advised on the need for annual eye exams, annual foot exams, Pneumonia vaccine. - POCT glucose (manual entry) - POCT glycosylated hemoglobin (Hb A1C) - atorvastatin (LIPITOR) 20 MG tablet; Take 1 tablet (20 mg total) by mouth daily.  Dispense: 30 tablet; Refill: 6 -  metFORMIN (GLUCOPHAGE) 500 MG tablet; Take 0.5 tablets (250 mg total) by mouth daily.  Dispense: 30 tablet; Refill: 6 - Lipid panel - CMP14+EGFR  2. Muscle twitching We will need to check his electrolytes Reassurance that if labs are negative this could be benign fasciculations  3. HTN (hypertension), benign Uncontrolled due to running out of antihypertensive which I have refilled Counseled on blood pressure goal of less than 130/80, low-sodium, DASH diet, medication compliance, 150 minutes of moderate intensity exercise per week. Discussed medication compliance, adverse effects. - lisinopril-hydrochlorothiazide (ZESTORETIC) 20-25 MG tablet; Take 1 tablet by mouth daily.  Dispense: 30 tablet; Refill: 6  4. Hepatic steatosis He is working on a low-cholesterol diet and weight loss  5. Hyperbilirubinemia Bilirubin was 1.6 Repeat bilirubin today.  Return in about 6 months (around 12/29/2019) for medical conditions.      Charlott Rakes, MD, FAAFP. [facility] and [place], [place]   06/29/2019, 9:43 AM

## 2019-06-29 NOTE — Progress Notes (Signed)
States that he is having vibration in his body.

## 2019-06-30 LAB — CMP14+EGFR
ALT: 20 IU/L (ref 0–44)
AST: 22 IU/L (ref 0–40)
Albumin/Globulin Ratio: 1.9 (ref 1.2–2.2)
Albumin: 4.8 g/dL (ref 4.0–5.0)
Alkaline Phosphatase: 53 IU/L (ref 39–117)
BUN/Creatinine Ratio: 17 (ref 9–20)
BUN: 14 mg/dL (ref 6–24)
Bilirubin Total: 1.4 mg/dL — ABNORMAL HIGH (ref 0.0–1.2)
CO2: 19 mmol/L — ABNORMAL LOW (ref 20–29)
Calcium: 9.7 mg/dL (ref 8.7–10.2)
Chloride: 104 mmol/L (ref 96–106)
Creatinine, Ser: 0.84 mg/dL (ref 0.76–1.27)
GFR calc Af Amer: 127 mL/min/{1.73_m2} (ref >59)
GFR calc non Af Amer: 110 mL/min/{1.73_m2} (ref >59)
Globulin, Total: 2.5 g/dL (ref 1.5–4.5)
Glucose: 128 mg/dL — ABNORMAL HIGH (ref 65–99)
Potassium: 4.8 mmol/L (ref 3.5–5.2)
Sodium: 139 mmol/L (ref 134–144)
Total Protein: 7.3 g/dL (ref 6.0–8.5)

## 2019-06-30 LAB — LIPID PANEL
Chol/HDL Ratio: 4.8 ratio (ref 0.0–5.0)
Cholesterol, Total: 163 mg/dL (ref 100–199)
HDL: 34 mg/dL — ABNORMAL LOW (ref >39)
LDL Chol Calc (NIH): 108 mg/dL — ABNORMAL HIGH (ref 0–99)
Triglycerides: 117 mg/dL (ref 0–149)
VLDL Cholesterol Cal: 21 mg/dL (ref 5–40)

## 2019-07-02 ENCOUNTER — Telehealth: Payer: Self-pay

## 2019-07-02 NOTE — Telephone Encounter (Signed)
Patient name and DOB has been verified Patient was informed of lab results. Patient had no questions.  

## 2019-09-13 MED FILL — LISINOPRIL-HYDROCHLOROTHIAZ: 20-25 | 30 days supply | Qty: 30 | Fill #2

## 2019-09-13 MED FILL — METFORMIN HCL 500 MG TABS: 500 | 30 days supply | Qty: 15 | Fill #2

## 2019-09-13 MED FILL — ?ATORVASTATIN 20 MG TABLET: 20 | 30 days supply | Qty: 30 | Fill #2

## 2019-11-22 MED FILL — METFORMIN HCL 500 MG TABS: 500 | 30 days supply | Qty: 15 | Fill #3

## 2019-11-22 MED FILL — ?ATORVASTATIN 20 MG TABLET: 20 | 30 days supply | Qty: 30 | Fill #3

## 2019-11-22 MED FILL — LISINOPRIL-HYDROCHLOROTHIAZ: 20-25 | 30 days supply | Qty: 30 | Fill #3

## 2019-12-13 ENCOUNTER — Ambulatory Visit: Payer: Self-pay | Admitting: Family Medicine

## 2020-01-03 ENCOUNTER — Ambulatory Visit: Payer: Self-pay

## 2020-01-04 ENCOUNTER — Ambulatory Visit: Payer: Self-pay | Attending: Critical Care Medicine

## 2020-01-04 DIAGNOSIS — Z23 Encounter for immunization: Secondary | ICD-10-CM

## 2020-01-04 NOTE — Progress Notes (Signed)
   Covid-19 Vaccination Clinic  Name:  Billy Gregory    MRN: [id number] DOB: 05-21-1980  01/04/2020  Mr. Philbert Riser was observed post Covid-19 immunization for 15 minutes without incident. He was provided with Vaccine Information Sheet and instruction to access the V-Safe system.   Mr. Philbert Riser was instructed to call 911 with any severe reactions post vaccine: Marland Kitchen Difficulty breathing  . Swelling of face and throat  . A fast heartbeat  . A bad rash all over body  . Dizziness and weakness   Immunizations Administered    Name Date Dose VIS Date Route   Pfizer COVID-19 Vaccine 01/04/2020  3:11 PM 0.3 mL 05/19/2018 Intramuscular   Manufacturer: [employer], [employer]   Lot: [id number]   [place]: [place] [facility]  Name:  LAURANCE HEIDE    MRN: [id number] DOB: Aug 25, 1980  01/04/2020  Mr. Philbert Riser was observed post Covid-19 immunization for 15 minutes without incident. He was provided with Vaccine Information Sheet and instruction to access the V-Safe system.   Mr. Philbert Riser was instructed to call 911 with any severe reactions post vaccine: Marland Kitchen Difficulty breathing  . Swelling of face and throat  . A fast heartbeat  . A bad rash all over body  . Dizziness and weakness   Immunizations Administered    Name Date Dose VIS Date Route   Pfizer COVID-19 Vaccine 01/04/2020  3:11 PM 0.3 mL 05/19/2018 Intramuscular   Manufacturer: [employer], [employer]   Lot: [id number]   [place]: [address]-3

## 2020-01-25 ENCOUNTER — Ambulatory Visit: Payer: Self-pay

## 2020-01-28 ENCOUNTER — Ambulatory Visit: Payer: Self-pay | Attending: Internal Medicine

## 2020-01-28 DIAGNOSIS — Z23 Encounter for immunization: Secondary | ICD-10-CM

## 2020-01-28 NOTE — Progress Notes (Signed)
   Covid-19 Vaccination Clinic  Name:  Billy Gregory    MRN: [id number] DOB: 07-Sep-1980  01/28/2020  [name] was observed post Covid-19 immunization for 15 minutes without incident. He was provided with Vaccine Information Sheet and instruction to access the V-Safe system.   [name] was instructed to call 911 with any severe reactions post vaccine: Marland Kitchen Difficulty breathing  . Swelling of face and throat  . A fast heartbeat  . A bad rash all over body  . Dizziness and weakness   Immunizations Administered    Name Date Dose VIS Date Route   Pfizer COVID-19 Vaccine 01/28/2020  9:32 AM 0.3 mL 01/12/2020 Intramuscular   Manufacturer: [place]   Lot: [id number]   [place]: [address]-7

## 2020-02-14 ENCOUNTER — Ambulatory Visit: Payer: Self-pay | Admitting: Family Medicine

## 2020-02-16 MED FILL — METFORMIN HCL 500 MG TABS: 500 | 30 days supply | Qty: 15 | Fill #4

## 2020-02-16 MED FILL — LISINOPRIL-HYDROCHLOROTHIAZ: 20-25 | 30 days supply | Qty: 30 | Fill #4

## 2020-02-16 MED FILL — ?ATORVASTATIN 20 MG TABLET: 20 | 30 days supply | Qty: 30 | Fill #4

## 2020-04-05 ENCOUNTER — Other Ambulatory Visit: Payer: Self-pay

## 2020-04-05 ENCOUNTER — Ambulatory Visit: Payer: Self-pay | Attending: Family Medicine | Admitting: Family Medicine

## 2020-04-05 ENCOUNTER — Other Ambulatory Visit: Payer: Self-pay | Admitting: Family Medicine

## 2020-04-05 DIAGNOSIS — R251 Tremor, unspecified: Secondary | ICD-10-CM

## 2020-04-05 DIAGNOSIS — E119 Type 2 diabetes mellitus without complications: Secondary | ICD-10-CM

## 2020-04-05 DIAGNOSIS — I10 Essential (primary) hypertension: Secondary | ICD-10-CM

## 2020-04-05 MED ORDER — LISINOPRIL-HYDROCHLOROTHIAZIDE 20-25 MG PO TABS: 1.0000 | ORAL_TABLET | Freq: Every day | ORAL | 6 refills | Status: DC

## 2020-04-05 MED ORDER — ATORVASTATIN CALCIUM 20 MG PO TABS: 20.0000 mg | ORAL_TABLET | Freq: Every day | ORAL | 6 refills | Status: DC

## 2020-04-05 MED FILL — LISINOPRIL-HYDROCHLOROTHIAZ: 20-25 | 30 days supply | Qty: 30 | Fill #0

## 2020-04-05 MED FILL — ATORVASTATIN CALCIUM 20 MG: 20 | 30 days supply | Qty: 30 | Fill #0

## 2020-04-05 NOTE — Progress Notes (Signed)
Virtual Visit via Telephone Note  I connected with Billy Gregory, on 04/05/2020 at 3:36 PM by telephone due to the COVID-19 pandemic and verified that I am speaking with the correct person using two identifiers.   Consent: I discussed the limitations, risks, security and privacy concerns of performing an evaluation and management service by telephone and the availability of in person appointments. I also discussed with the patient that there may be a patient responsible charge related to this service. The patient expressed understanding and agreed to proceed.   Location of Patient: Home  Location of Provider: Home   Persons participating in Telemedicine visit: Boe Mohammed Ozzie Hoyle Interpreter ID# [id number]- Threasa Alpha Dr. Margarita Rana     History of Present Illness: Billy Gregory is a 40 year old male with a history of type 2 diabetes mellitus (A1c 6.2), hypertension who presents today forafollow-up visit This morning he blood sugar was 170. He does not check his sugars regularly but endorses compliance with his medications. He has not been taking Metformin daily but rather every 3 days. He has has some tremors and he tries to relax to make symptoms resolve. He denies alcohol consumption.   Past Medical History:  Diagnosis Date  . Diabetes mellitus without complication ([place])   . Hypertension    No Known Allergies  Current Outpatient Medications on File Prior to Visit  Medication Sig Dispense Refill  . acetaminophen (TYLENOL) 325 MG tablet Take 650 mg by mouth every 6 (six) hours as needed.    Marland Kitchen atorvastatin (LIPITOR) 20 MG tablet Take 1 tablet (20 mg total) by mouth daily. 30 tablet 6  . cholecalciferol (VITAMIN D) 1000 units tablet Take 5,000 Units by mouth daily.    Marland Kitchen lisinopril-hydrochlorothiazide (ZESTORETIC) 20-25 MG tablet Take 1 tablet by mouth daily. 30 tablet 6  . metFORMIN (GLUCOPHAGE) 500 MG tablet Take 0.5 tablets (250 mg total) by  mouth daily. 30 tablet 6  . Blood Glucose Monitoring Suppl (TRUE METRIX METER) w/Device KIT USE AS INSTRUCTED (Patient not taking: No sig reported) 1 kit 0  . glucose blood test strip TEST YOUR BLOOD SUGAR ONCE DAILY IN THE MORNING (Patient not taking: No sig reported) 100 each 6  . TRUEplus Lancets 28G MISC USE TO CHECK BLOOD SUGARS DAILY. (Patient not taking: No sig reported) 100 each 6   No current facility-administered medications on file prior to visit.    Observations/Objective: Awake, alert, oriented x3 Not in acute distress   Lab Results  Component Value Date   HGBA1C 6.2 06/29/2019    Assessment and Plan: 1. Type 2 diabetes mellitus without complication, without long-term current use of insulin (HCC) Controlled with A1c of 6.2 Will refill Metformin when A1c is obtained Counseled on Diabetic diet, my plate method, 563 minutes of moderate intensity exercise/week Blood sugar logs with fasting goals of 80-120 mg/dl, random of less than 180 and in the event of sugars less than 60 mg/dl or greater than 400 mg/dl encouraged to notify the clinic. Advised on the need for annual eye exams, annual foot exams, Pneumonia vaccine. - Hemoglobin A1c; Future - CMP14+EGFR; Future - Lipid panel; Future - atorvastatin (LIPITOR) 20 MG tablet; Take 1 tablet (20 mg total) by mouth daily.  Dispense: 30 tablet; Refill: 6  2. HTN (hypertension), benign Controlled Counseled on blood pressure goal of less than 130/80, low-sodium, DASH diet, medication compliance, 150 minutes of moderate intensity exercise per week. Discussed medication compliance, adverse effects. - lisinopril-hydrochlorothiazide (ZESTORETIC) 20-25 MG tablet; Take 1 tablet by mouth  daily.  Dispense: 30 tablet; Refill: 6  3. Tremor Advised to check blood sugars when this occurs   Follow Up Instructions: 6 months   I discussed the assessment and treatment plan with the patient. The patient was provided an opportunity to ask  questions and all were answered. The patient agreed with the plan and demonstrated an understanding of the instructions.   The patient was advised to call back or seek an in-person evaluation if the symptoms worsen or if the condition fails to improve as anticipated.     I provided 13 minutes total of non-face-to-face time during this encounter including median intraservice time, reviewing previous notes, investigations, ordering medications, medical decision making, coordinating care and patient verbalized understanding at the end of the visit.     Charlott Rakes, MD, FAAFP. [facility] and [place], [place]   04/05/2020, 3:36 PM

## 2020-04-05 NOTE — Progress Notes (Signed)
Needs refills on medications and testing supplies.

## 2020-04-14 ENCOUNTER — Other Ambulatory Visit: Payer: Self-pay

## 2020-04-14 ENCOUNTER — Ambulatory Visit: Payer: Self-pay | Attending: Family Medicine

## 2020-04-14 DIAGNOSIS — E119 Type 2 diabetes mellitus without complications: Secondary | ICD-10-CM

## 2020-04-14 MED FILL — ATORVASTATIN CALCIUM 20 MG: 20 | 30 days supply | Qty: 30 | Fill #0

## 2020-04-14 MED FILL — LISINOPRIL-HYDROCHLOROTHIAZ: 20-25 | 30 days supply | Qty: 30 | Fill #0

## 2020-04-15 LAB — HEMOGLOBIN A1C
Est. average glucose Bld gHb Est-mCnc: 217 mg/dL
Hgb A1c MFr Bld: 9.2 % — ABNORMAL HIGH (ref 4.8–5.6)

## 2020-04-15 LAB — CMP14+EGFR
ALT: 28 IU/L (ref 0–44)
AST: 16 IU/L (ref 0–40)
Albumin/Globulin Ratio: 1.6 (ref 1.2–2.2)
Albumin: 4.7 g/dL (ref 4.0–5.0)
Alkaline Phosphatase: 64 IU/L (ref 44–121)
BUN/Creatinine Ratio: 12 (ref 9–20)
BUN: 11 mg/dL (ref 6–24)
Bilirubin Total: 1.3 mg/dL — ABNORMAL HIGH (ref 0.0–1.2)
CO2: 21 mmol/L (ref 20–29)
Calcium: 9.8 mg/dL (ref 8.7–10.2)
Chloride: 102 mmol/L (ref 96–106)
Creatinine, Ser: 0.91 mg/dL (ref 0.76–1.27)
GFR calc Af Amer: 121 mL/min/{1.73_m2} (ref >59)
GFR calc non Af Amer: 105 mL/min/{1.73_m2} (ref >59)
Globulin, Total: 2.9 g/dL (ref 1.5–4.5)
Glucose: 219 mg/dL — ABNORMAL HIGH (ref 65–99)
Potassium: 4.7 mmol/L (ref 3.5–5.2)
Sodium: 140 mmol/L (ref 134–144)
Total Protein: 7.6 g/dL (ref 6.0–8.5)

## 2020-04-15 LAB — LIPID PANEL
Chol/HDL Ratio: 5.7 ratio — ABNORMAL HIGH (ref 0.0–5.0)
Cholesterol, Total: 201 mg/dL — ABNORMAL HIGH (ref 100–199)
HDL: 35 mg/dL — ABNORMAL LOW (ref >39)
LDL Chol Calc (NIH): 129 mg/dL — ABNORMAL HIGH (ref 0–99)
Triglycerides: 205 mg/dL — ABNORMAL HIGH (ref 0–149)
VLDL Cholesterol Cal: 37 mg/dL (ref 5–40)

## 2020-04-16 ENCOUNTER — Other Ambulatory Visit: Payer: Self-pay | Admitting: Family Medicine

## 2020-04-16 DIAGNOSIS — E119 Type 2 diabetes mellitus without complications: Secondary | ICD-10-CM

## 2020-04-16 MED ORDER — METFORMIN HCL 500 MG PO TABS: 1000.0000 mg | ORAL_TABLET | Freq: Two times a day (BID) | ORAL | 6 refills | Status: DC

## 2020-04-16 MED ORDER — ATORVASTATIN CALCIUM 40 MG PO TABS: 20.0000 mg | ORAL_TABLET | Freq: Every day | ORAL | 6 refills | Status: DC

## 2020-04-17 MED FILL — METFORMIN HCL 500 MG TABS: 500 | 30 days supply | Qty: 120 | Fill #0

## 2020-05-18 ENCOUNTER — Ambulatory Visit: Payer: Self-pay | Attending: Internal Medicine | Admitting: Internal Medicine

## 2020-05-18 ENCOUNTER — Other Ambulatory Visit: Payer: Self-pay

## 2020-05-18 ENCOUNTER — Encounter: Payer: Self-pay | Admitting: Internal Medicine

## 2020-05-18 VITALS — BP 158/85 | HR 92 | Resp 16 | Wt 165.6 lb

## 2020-05-18 DIAGNOSIS — R300 Dysuria: Secondary | ICD-10-CM

## 2020-05-18 DIAGNOSIS — N481 Balanitis: Secondary | ICD-10-CM

## 2020-05-18 DIAGNOSIS — Z114 Encounter for screening for human immunodeficiency virus [HIV]: Secondary | ICD-10-CM

## 2020-05-18 LAB — POCT URINALYSIS DIP (CLINITEK)
Bilirubin, UA: NEGATIVE
Glucose, UA: 100 mg/dL — AB
Ketones, POC UA: NEGATIVE mg/dL
Leukocytes, UA: NEGATIVE
Nitrite, UA: NEGATIVE
POC PROTEIN,UA: NEGATIVE
Spec Grav, UA: 1.025 (ref 1.010–1.025)
Urobilinogen, UA: 0.2 E.U./dL
pH, UA: 5.5 (ref 5.0–8.0)

## 2020-05-18 MED ORDER — MICONAZOLE NITRATE 2 % EX CREA: 1.0000 "application " | TOPICAL_CREAM | Freq: Two times a day (BID) | CUTANEOUS | 0 refills | Status: AC

## 2020-05-18 NOTE — Progress Notes (Signed)
Patient ID: [name], male    DOB: 04/12/1980  MRN: [id number]  CC: Referral   Subjective: Billy Gregory is a 40 y.o. male who presents for UC visit His concerns today include:  Pt with hx of DM 2, tob dep, HL  Pt c/o having "some lesions on my parts; more like cuts."  Just noticed today. Reports having white discgh from penis x 3 days.  Notices the white discgh when he urinates. Some burning when urine touches the "cuts" on the penis but does not burn when it is coming out. Last sexually active 2 mths ago.  Active with only 1 partner in the past 6 mths who is his wife. Wife is asymptomatic.    Patient Active Problem List   Diagnosis Date Noted  . Hyperlipidemia 03/04/2016  . HTN (hypertension), benign 03/04/2016  . Hypertriglyceridemia 11/22/2014  . Type 2 diabetes mellitus without complication ([place]) 49/44/9675  . Smoking 04/08/2014  . Dehydration 04/03/2014  . Hyperglycemia 04/03/2014  . Diabetes mellitus, new onset [facility])      Current Outpatient Medications on File Prior to Visit  Medication Sig Dispense Refill  . acetaminophen (TYLENOL) 325 MG tablet Take 650 mg by mouth every 6 (six) hours as needed.    Marland Kitchen atorvastatin (LIPITOR) 40 MG tablet Take 0.5 tablets (20 mg total) by mouth daily. 30 tablet 6  . Blood Glucose Monitoring Suppl (TRUE METRIX METER) w/Device KIT USE AS INSTRUCTED (Patient not taking: No sig reported) 1 kit 0  . cholecalciferol (VITAMIN D) 1000 units tablet Take 5,000 Units by mouth daily.    Marland Kitchen glucose blood test strip TEST YOUR BLOOD SUGAR ONCE DAILY IN THE MORNING (Patient not taking: No sig reported) 100 each 6  . lisinopril-hydrochlorothiazide (ZESTORETIC) 20-25 MG tablet Take 1 tablet by mouth daily. 30 tablet 6  . metFORMIN (GLUCOPHAGE) 500 MG tablet Take 2 tablets (1,000 mg total) by mouth 2 (two) times daily with a meal. 120 tablet 6  . TRUEplus Lancets 28G MISC USE TO CHECK BLOOD SUGARS DAILY. (Patient not taking: No sig  reported) 100 each 6   No current facility-administered medications on file prior to visit.    No Known Allergies  Social History   Socioeconomic History  . Marital status: Single    Spouse name: Not on file  . Number of children: Not on file  . Years of education: Not on file  . Highest education level: Not on file  Occupational History    Employer: [employer]    Comment: Housekeeping  Tobacco Use  . Smoking status: Never Smoker  . Smokeless tobacco: Never Used  Substance and Sexual Activity  . Alcohol use: No    Alcohol/week: 0.0 standard drinks  . Drug use: No  . Sexual activity: Not on file  Other Topics Concern  . Not on file  Social History Narrative   Employment: housekeeping     From [place].   Social Determinants of Health   Financial Resource Strain: Not on file  Food Insecurity: Not on file  Transportation Needs: Not on file  Physical Activity: Not on file  Stress: Not on file  Social Connections: Not on file  Intimate Partner Violence: Not on file    Family History  Problem Relation Age of Onset  . Diabetes Paternal Aunt     No past surgical history on file.  ROS: Review of Systems Negative except as stated above  PHYSICAL EXAM: BP (!) 158/85   Pulse 92  Resp 16   Wt 165 lb 9.6 oz (75.1 kg)   SpO2 98%   BMI 28.43 kg/m   Physical Exam  General appearance - alert, well appearing, and in no distress Mental status - normal mood, behavior, speech, dress, motor activity, and thought processes GU Male -my CMA Ms. Lebron Quam is present. Patient is uncircumcised. There is a rubbery moist inflamed appearance to the foreskin that covers the penis. Skin has a few linear breaks in it. No discharge expressed from the tip of the penis.  Results for orders placed or performed in visit on 05/18/20  POCT URINALYSIS DIP (CLINITEK)  Result Value Ref Range   Color, UA yellow yellow   Clarity, UA clear clear   Glucose, UA =100 (A) negative mg/dL    Bilirubin, UA negative negative   Ketones, POC UA negative negative mg/dL   Spec Grav, UA 1.025 1.010 - 1.025   Blood, UA trace-intact (A) negative   pH, UA 5.5 5.0 - 8.0   POC PROTEIN,UA negative negative, trace   Urobilinogen, UA 0.2 0.2 or 1.0 E.U./dL   Nitrite, UA Negative Negative   Leukocytes, UA Negative Negative    CMP Latest Ref Rng & Units 04/14/2020 06/29/2019 02/08/2019  Glucose 65 - 99 mg/dL 219(H) 128(H) 134(H)  BUN 6 - 24 mg/dL 11 14 14   Creatinine 0.76 - 1.27 mg/dL 0.91 0.84 0.96  Sodium 134 - 144 mmol/L 140 139 140  Potassium 3.5 - 5.2 mmol/L 4.7 4.8 4.9  Chloride 96 - 106 mmol/L 102 104 103  CO2 20 - 29 mmol/L 21 19(L) 23  Calcium 8.7 - 10.2 mg/dL 9.8 9.7 9.7  Total Protein 6.0 - 8.5 g/dL 7.6 7.3 7.7  Total Bilirubin 0.0 - 1.2 mg/dL 1.3(H) 1.4(H) 1.7(H)  Alkaline Phos 44 - 121 IU/L 64 53 59  AST 0 - 40 IU/L 16 22 27   ALT 0 - 44 IU/L 28 20 38   Lipid Panel     Component Value Date/Time   CHOL 201 (H) 04/14/2020 0834   TRIG 205 (H) 04/14/2020 0834   HDL 35 (L) 04/14/2020 0834   CHOLHDL 5.7 (H) 04/14/2020 0834   CHOLHDL 4.1 06/11/2016 1441   VLDL 26 06/11/2016 1441   LDLCALC 129 (H) 04/14/2020 0834    CBC    Component Value Date/Time   WBC 9.2 09/19/2015 1719   RBC 5.08 09/19/2015 1719   HGB 14.9 09/19/2015 1719   HCT 41.9 09/19/2015 1719   PLT 228 09/19/2015 1719   MCV 82.5 09/19/2015 1719   MCH 29.3 09/19/2015 1719   MCHC 35.6 09/19/2015 1719   RDW 13.8 09/19/2015 1719   LYMPHSABS 2,668 09/19/2015 1719   MONOABS 552 09/19/2015 1719   EOSABS 276 09/19/2015 1719   BASOSABS 92 09/19/2015 1719    ASSESSMENT AND PLAN: 1. Balanitis Discussed good hygiene and the importance of keeping the head of the penis and the foreskin clean and dry. - miconazole (MICOTIN) 2 % cream; Apply 1 application topically 2 (two) times daily.  Dispense: 28.35 g; Refill: 0  2. Dysuria Point-of-care UA negative for UTI. We did send for chlamydia/gonorrhea/trichomonas  testing. Patient wants to hold off on empiric treatment until results are back - POCT URINALYSIS DIP (CLINITEK) - Cytology (oral, anal, urethral) ancillary only  3. Screening for HIV (human immunodeficiency virus) He is agreeable to HIV screening. - HIV Antibody (routine testing w rflx)  Patient was given the opportunity to ask questions.  Patient verbalized understanding of the plan and  was able to repeat key elements of the plan.  [employer] interpreter used during this encounter. Clifton James #[id number]  No orders of the defined types were placed in this encounter.    Requested Prescriptions    No prescriptions requested or ordered in this encounter    No follow-ups on file.  Karle Plumber, MD, FACP

## 2020-05-18 NOTE — Patient Instructions (Signed)
Ferri's Clinical Advisor 2018 (pp. [phone]). [place], [place]: Elsevier.">  Balanitis Balanitis  La balanitis es la hinchazn e irritacin de la cabeza del pene (glande). La balanitis se produce ms frecuentemente entre hombres a los que no se les ha quitado el prepucio (no circuncidados). En los hombres no circuncidados, la afeccin tambin puede causar inflamacin de la piel alrededor del prepucio. La balanitis a veces causa cicatrices en el pene o el prepucio que pueden requerir [place]. Esta afeccin puede ocurrir debido a una infeccin o a causa de Film/video editor. Si no se trata, la balanitis puede aumentar el riesgo del cncer de pene. Cules son las causas? Las causas ms frecuentes de esta afeccin incluyen las siguientes:  Mala higiene personal, especialmente en los hombres no circuncidados. No limpiar el glande ni el prepucio puede ocasionar la acumulacin de bacterias, virus y hongos, lo que puede provocar infeccin e inflamacin.  Irritacin y falta de circulacin de aire debido al lquido (esmegma) que puede acumularse en el glande. Otras causas son las siguientes:  Irritacin qumica por productos como jabones o geles de bao, especialmente aquellos que tienen fragancia. La irritacin qumica tambin puede ser causada por condones, lubricantes personales, vaselina, espermicidas o suavizantes para la ropa.  Enfermedades de la piel, como el eczema, la dermatitis y la psoriasis.  Alergias a medicamentos, como tetraciclina y sulfamidas. Qu incrementa el riesgo? Los siguientes factores pueden hacer que sea ms propenso a Emergency planning/management officer afeccin:  No estar circuncidado.  Tener diabetes.  Tener otras enfermedades, como la cirrosis heptica, insuficiencia cardaca congestiva y enfermedad renal.  Tener infecciones tales como candidiasis, VPH (virus del papiloma humano), herpes simple, gonorrea o sfilis.  Tener un prepucio apretado que es difcil de tirar Water engineer atrs (retraer)  hasta pasar el glande.  Ser extremadamente obeso. Cules son los signos o sntomas? Los sntomas de esta afeccin incluyen:  Secrecin de debajo del prepucio y dolor o dificultad para retraer el prepucio.  Mal olor o picazn en el pene.  Dolor a Secretary/administrator, enrojecimiento e hinchazn del glande.  Erupcin o llagas en el glande o el prepucio.  Incapacidad de [employer] ereccin a causa del Social research officer, government.  Dificultad para orinar.  Cicatrices en el pene o el prepucio, en algunos casos. Cmo se diagnostica? Esta afeccin se puede diagnosticar mediante un examen fsico y anlisis de un hisopado de secrecin para comprobar si hay infeccin por hongos o bacteriana. Tambin pueden hacerle anlisis de sangre para detectar lo siguiente:  Virus que pueden causar balanitis.  Un nivel alto de azcar en la sangre (glucosa). Esto podra ser un signo de diabetes, que puede aumentar el riesgo de balanitis. Cmo se trata? El tratamiento para la balanitis depende de la causa. El tratamiento puede incluir:  Mejorar la higiene personal. El mdico podra recomendarle que tome baos con la cadera y las nalgas sumergidas en agua tibia (baos de asiento).  Tomar medicamentos, por ejemplo: ? Cremas y ungentos para reducir la hinchazn (corticoesteroides) o para tratar una infeccin. ? Antibiticos. ? Medicamentos antimicticos.  Clementeen Hoof para extirpar o cortar el prepucio (circuncisin). Esto puede hacerse si tiene cicatrices en el prepucio que dificultan tirarlo [employer].  Controlar otros problemas mdicos que puedan estar causando su afeccin o empeorndola. Siga estas instrucciones en su casa: Medicamentos  [employer] de venta libre y los recetados solamente como se lo haya indicado el mdico.  Si le recetaron un antibitico, una crema o un ungento, tmelo o aplqueselo como se lo haya  indicado el mdico. No interrumpa el medicamento, la crema o el ungento aun si se siente  mejor. Instrucciones generales  No tenga relaciones sexuales hasta que la afeccin se cure o el mdico lo autorice.  Mantenga el pene limpio y [place] baos de asiento segn le recomiende el mdico.  Evite productos que le irriten la piel o [employer] sntomas, como jabones y geles de bao con fragancia.  Concurra a todas las visitas de seguimiento como se lo haya indicado el mdico. Esto es importante. Comunquese con un mdico si:  Los sntomas empeoran o no mejoran con los Multimedia programmer.  Tiene escalofros o fiebre.  Tiene problemas para orinar.  No puede retraer el prepucio. Solicite ayuda de inmediato si:  Siente dolor intenso.  No puede orinar. Resumen  La balanitis es la inflamacin de la cabeza del pene (glande) causada por irritacin o infeccin. Esta afeccin es ms frecuente en los hombres no circuncidados.  La balanitis causa dolor, enrojecimiento e hinchazn del glande.  Es importante una buena higiene personal.  El tratamiento puede incluir mejorar la higiene personal y aplicarse cremas o ungentos.  Comunquese con un mdico si los sntomas empeoran o no mejoran con el Art therapist. Esta informacin no tiene Marine scientist el consejo del mdico. Asegrese de hacerle al mdico cualquier pregunta que tenga. Document Revised: 03/24/2019 Document Reviewed: 03/24/2019 Elsevier Patient Education  [place].

## 2020-05-19 LAB — CYTOLOGY, (ORAL, ANAL, URETHRAL) ANCILLARY ONLY
Chlamydia: NEGATIVE
Comment: NEGATIVE
Comment: NEGATIVE
Comment: NORMAL
Neisseria Gonorrhea: NEGATIVE
Trichomonas: NEGATIVE

## 2020-05-19 LAB — HIV ANTIBODY (ROUTINE TESTING W REFLEX): HIV Screen 4th Generation wRfx: NONREACTIVE

## 2020-05-23 ENCOUNTER — Encounter: Payer: Self-pay | Admitting: Internal Medicine

## 2020-05-23 ENCOUNTER — Telehealth: Payer: Self-pay

## 2020-05-23 NOTE — Telephone Encounter (Signed)
[place] interpreters  nayely Id# [phone] contacted pt to go over lab results pt didn't answer lvm

## 2020-05-26 MED FILL — ?ATORVASTATIN 20 MG TABLET: 20 | 30 days supply | Qty: 30 | Fill #5

## 2020-05-26 MED FILL — LISINOPRIL-HYDROCHLOROTHIAZ: 20-25 | 30 days supply | Qty: 30 | Fill #1

## 2020-06-24 ENCOUNTER — Other Ambulatory Visit: Payer: Self-pay

## 2020-07-04 ENCOUNTER — Other Ambulatory Visit: Payer: Self-pay

## 2020-07-04 MED FILL — Metformin HCl Tab 500 MG: ORAL | 30 days supply | Qty: 120 | Fill #0 | Status: AC

## 2020-07-04 MED FILL — Lisinopril & Hydrochlorothiazide Tab 20-25 MG: ORAL | 30 days supply | Qty: 30 | Fill #0 | Status: AC

## 2020-07-28 ENCOUNTER — Other Ambulatory Visit: Payer: Self-pay

## 2020-07-28 MED FILL — Metformin HCl Tab 500 MG: ORAL | 30 days supply | Qty: 120 | Fill #1 | Status: AC

## 2020-07-28 MED FILL — Lisinopril & Hydrochlorothiazide Tab 20-25 MG: ORAL | 30 days supply | Qty: 30 | Fill #1 | Status: AC

## 2020-07-28 MED FILL — Atorvastatin Calcium Tab 40 MG (Base Equivalent): ORAL | 30 days supply | Qty: 15 | Fill #0 | Status: CN

## 2020-07-31 ENCOUNTER — Other Ambulatory Visit: Payer: Self-pay

## 2020-07-31 MED FILL — Atorvastatin Calcium Tab 40 MG (Base Equivalent): ORAL | 30 days supply | Qty: 15 | Fill #0 | Status: AC

## 2020-09-14 ENCOUNTER — Other Ambulatory Visit: Payer: Self-pay

## 2020-09-14 MED FILL — Lisinopril & Hydrochlorothiazide Tab 20-25 MG: ORAL | 30 days supply | Qty: 30 | Fill #2 | Status: AC

## 2020-09-14 MED FILL — Atorvastatin Calcium Tab 40 MG (Base Equivalent): ORAL | 30 days supply | Qty: 15 | Fill #1 | Status: AC

## 2020-09-15 ENCOUNTER — Other Ambulatory Visit: Payer: Self-pay

## 2020-10-12 ENCOUNTER — Encounter: Payer: Self-pay | Admitting: Physician Assistant

## 2020-10-12 ENCOUNTER — Other Ambulatory Visit: Payer: Self-pay

## 2020-10-12 ENCOUNTER — Ambulatory Visit: Payer: Self-pay | Attending: Physician Assistant | Admitting: Physician Assistant

## 2020-10-12 VITALS — BP 134/78 | HR 92 | Resp 16 | Wt 158.4 lb

## 2020-10-12 DIAGNOSIS — E781 Pure hyperglyceridemia: Secondary | ICD-10-CM

## 2020-10-12 DIAGNOSIS — I10 Essential (primary) hypertension: Secondary | ICD-10-CM

## 2020-10-12 DIAGNOSIS — E1165 Type 2 diabetes mellitus with hyperglycemia: Secondary | ICD-10-CM

## 2020-10-12 DIAGNOSIS — Z789 Other specified health status: Secondary | ICD-10-CM

## 2020-10-12 DIAGNOSIS — E785 Hyperlipidemia, unspecified: Secondary | ICD-10-CM

## 2020-10-12 DIAGNOSIS — Z9114 Patient's other noncompliance with medication regimen: Secondary | ICD-10-CM

## 2020-10-12 LAB — POCT URINALYSIS DIP (CLINITEK)
Bilirubin, UA: NEGATIVE
Blood, UA: NEGATIVE
Glucose, UA: 500 mg/dL — AB
Leukocytes, UA: NEGATIVE
Nitrite, UA: NEGATIVE
POC PROTEIN,UA: NEGATIVE
Spec Grav, UA: 1.02 (ref 1.010–1.025)
Urobilinogen, UA: 0.2 E.U./dL
pH, UA: 5 (ref 5.0–8.0)

## 2020-10-12 LAB — GLUCOSE, POCT (MANUAL RESULT ENTRY)
POC Glucose: 391 mg/dl — AB (ref 70–99)
POC Glucose: 314 mg/dl — AB (ref 70–99)

## 2020-10-12 LAB — POCT GLYCOSYLATED HEMOGLOBIN (HGB A1C): HbA1c, POC (controlled diabetic range): 12.2 % — AB (ref 0.0–7.0)

## 2020-10-12 MED ORDER — METFORMIN HCL 500 MG PO TABS
ORAL_TABLET | Freq: Two times a day (BID) | ORAL | 6 refills | Status: DC
  Filled 2020-10-12: qty 120, 30d supply, fill #0
  Filled 2021-02-24: qty 120, 30d supply, fill #1
  Filled 2021-05-30: qty 120, 30d supply, fill #0
  Filled 2021-05-30: qty 120, 30d supply, fill #2
  Filled 2021-06-06: qty 120, 30d supply, fill #0

## 2020-10-12 MED ORDER — LISINOPRIL-HYDROCHLOROTHIAZIDE 20-25 MG PO TABS
1.0000 | ORAL_TABLET | Freq: Every day | ORAL | 6 refills | Status: DC
  Filled 2020-10-12: qty 30, 30d supply, fill #0
  Filled 2020-11-16: qty 30, 30d supply, fill #1
  Filled 2020-12-27: qty 30, 30d supply, fill #2
  Filled 2021-02-20: qty 30, 30d supply, fill #3
  Filled 2021-04-03: qty 30, 30d supply, fill #4
  Filled 2021-04-03: qty 30, 30d supply, fill #0
  Filled 2021-05-10: qty 30, 30d supply, fill #1
  Filled 2021-07-12: qty 30, 30d supply, fill #2

## 2020-10-12 MED ORDER — INSULIN ASPART 100 UNIT/ML IJ SOLN
20.0000 [IU] | Freq: Once | INTRAMUSCULAR | 99 refills | Status: DC
  Filled 2020-10-12: qty 10, 30d supply, fill #0

## 2020-10-12 MED ORDER — GLIPIZIDE 5 MG PO TABS
5.0000 mg | ORAL_TABLET | Freq: Two times a day (BID) | ORAL | 3 refills | Status: DC
  Filled 2020-10-12: qty 60, 30d supply, fill #0
  Filled 2020-11-16: qty 60, 30d supply, fill #1
  Filled 2020-12-27: qty 60, 30d supply, fill #2
  Filled 2021-02-20: qty 60, 30d supply, fill #3

## 2020-10-12 MED ORDER — ATORVASTATIN CALCIUM 40 MG PO TABS
ORAL_TABLET | ORAL | 6 refills | Status: DC
  Filled 2020-10-12: qty 15, 30d supply, fill #0
  Filled 2020-11-16: qty 15, 30d supply, fill #1
  Filled 2020-12-27: qty 15, 30d supply, fill #2
  Filled 2021-01-30: qty 15, 30d supply, fill #3
  Filled 2021-02-20: qty 15, 30d supply, fill #4
  Filled 2021-04-03: qty 15, 30d supply, fill #0
  Filled 2021-04-03: qty 15, 30d supply, fill #5
  Filled 2021-05-10: qty 15, 30d supply, fill #1
  Filled 2021-07-12: qty 15, 30d supply, fill #2

## 2020-10-12 NOTE — Progress Notes (Signed)
Pt states his sugar is high because he just ate 3-4 pieces of watermelon

## 2020-10-12 NOTE — Patient Instructions (Signed)
Set a timer or reminder to take both doses of your metformin daily.  You will also be taking the new medication I am prescribing twice daily with food.  Check blood sugars fasting and at bedtime and bring to next visit

## 2020-10-12 NOTE — Progress Notes (Signed)
Patient ID: Billy Gregory, male   DOB: March 18, 1981, 40 y.o.   MRN: [id number]      Billy Gregory, is a 40 y.o. male  IEP:[id number]  YSA:[id number]  DOB - [birth year]  Subjective:  Chief Complaint and HPI: Billy Gregory is a 40 y.o. male here today for med RF.  Here for RF.  Takes his metformin about 60% of the time.  Admits to poor diet and eats a lot of sugars.  Ate 3 pieces of watermelon before he came to his appt.  He drinks a lot of water.  He says he feels fine and has no complaints today.    Billy Gregory is interpreting.   ROS:   Constitutional:  No f/c, No night sweats, No unexplained weight loss. EENT:  No vision changes, No blurry vision, No hearing changes. No mouth, throat, or ear problems.  Respiratory: No cough, No SOB Cardiac: No CP, no palpitations GI:  No abd pain, No N/V/D. GU: No Urinary s/sx Musculoskeletal: No joint pain Neuro: No headache, no dizziness, no motor weakness.  Skin: No rash Endocrine:  No polydipsia. No polyuria.  Psych: Denies SI/HI  No problems updated.  ALLERGIES: No Known Allergies  PAST MEDICAL HISTORY: Past Medical History:  Diagnosis Date   Diabetes mellitus without complication ([place])    Hypertension     MEDICATIONS AT HOME: Prior to Admission medications   Medication Sig Start Date End Date Taking? Authorizing Provider  glipiZIDE (GLUCOTROL) 5 MG tablet Take 1 tablet (5 mg total) by mouth 2 (two) times daily before a meal. 10/12/20  Yes Billy Gregory Gregory, Gregory  insulin aspart (NOVOLOG) 100 UNIT/ML injection Inject 20 Units into the skin once for 1 dose. 10/12/20 10/13/20 Yes Billy Gregory, Billy Bucy, Gregory  acetaminophen (TYLENOL) 325 MG tablet Take 650 mg by mouth every 6 (six) hours as needed.    Provider, Historical, Gregory  atorvastatin (LIPITOR) 40 MG tablet TAKE 1/2 TABLETS BY MOUTH DAILY. 10/12/20 10/12/21  Billy Gregory  Blood Glucose Monitoring Suppl (TRUE METRIX METER) w/Device KIT USE AS  INSTRUCTED Patient not taking: No sig reported 02/08/19   Billy Gregory  cholecalciferol (VITAMIN D) 1000 units tablet Take 5,000 Units by mouth daily.    Provider, Historical, Gregory  glucose blood test strip TEST YOUR BLOOD SUGAR ONCE DAILY IN THE MORNING Patient not taking: No sig reported 02/08/19   Billy Gregory  lisinopril-hydrochlorothiazide (ZESTORETIC) 20-25 MG tablet TAKE 1 TABLET BY MOUTH DAILY. 10/12/20 10/12/21  Billy Gregory  metFORMIN (GLUCOPHAGE) 500 MG tablet TAKE 2 TABLETS (1,000 MG TOTAL) BY MOUTH 2 (TWO) TIMES DAILY WITH A MEAL. 10/12/20 10/12/21  Billy Gregory  miconazole (MICOTIN) 2 % cream Apply 1 application topically 2 (two) times daily. 05/18/20   Billy Gregory  TRUEplus Lancets 28G MISC USE TO CHECK BLOOD SUGARS DAILY. Patient not taking: No sig reported 02/08/19   Billy Gregory     Objective:  EXAM:   Vitals:   10/12/20 1401  BP: 134/78  Pulse: 92  Resp: 16  SpO2: 100%  Weight: 158 lb 6.4 oz (71.8 kg)    General appearance : A&OX3. NAD. Non-toxic-appearing HEENT: Atraumatic and Normocephalic.  PERRLA. EOM intact.  Chest/Lungs:  Breathing-non-labored, Good air entry bilaterally, breath sounds normal without rales, rhonchi, or wheezing  CVS: S1 S2 regular, no murmurs, gallops, rubs  Extremities: Bilateral Lower Ext shows no edema, both legs are warm to touch with = pulse throughout Neurology:  CN  II-XII grossly intact, Non focal.   Psych:  TP linear. J/I poor as related to diabetes. Normal speech. Appropriate eye contact and affect.  Skin:  No Rash  Data Review Lab Results  Component Value Date   HGBA1C 12.2 (A) 10/12/2020   HGBA1C 9.2 (H) 04/14/2020   HGBA1C 6.2 06/29/2019     Assessment & Plan   1. Type 2 diabetes mellitus with hyperglycemia, unspecified whether long term insulin use (HCC) Uncontrolled/worse-partial compliance with metformin Set a timer or reminder to take both doses of your metformin  daily.  You will also be taking the new medication I am prescribing twice daily with food.  Check blood sugars fasting and at bedtime and bring to next visit - Glucose (CBG) - POCT glycosylated hemoglobin (Hb A1C) - POCT URINALYSIS DIP (CLINITEK) - glipiZIDE (GLUCOTROL) 5 MG tablet; Take 1 tablet (5 mg total) by mouth 2 (two) times daily before a meal.  Dispense: 60 tablet; Refill: 3 - metFORMIN (GLUCOPHAGE) 500 MG tablet; TAKE 2 TABLETS (1,000 MG TOTAL) BY MOUTH 2 (TWO) TIMES DAILY WITH A MEAL.  Dispense: 120 tablet; Refill: 6 - atorvastatin (LIPITOR) 40 MG tablet; TAKE 1/2 TABLETS BY MOUTH DAILY.  Dispense: 30 tablet; Refill: 6 - Comprehensive metabolic panel - Glucose (CBG) I have had a lengthy discussion and provided education about insulin resistance and the intake of too much sugar/refined carbohydrates.  I have advised the patient to work at a goal of eliminating sugary drinks, candy, desserts, sweets, refined sugars, processed foods, and white carbohydrates.  The patient expresses understanding.    2. HTN (hypertension), benign controlled - lisinopril-hydrochlorothiazide (ZESTORETIC) 20-25 MG tablet; TAKE 1 TABLET BY MOUTH DAILY.  Dispense: 30 tablet; Refill: 6 - Comprehensive metabolic panel  3. Hypertriglyceridemia - atorvastatin (LIPITOR) 40 MG tablet; TAKE 1/2 TABLETS BY MOUTH DAILY.  Dispense: 30 tablet; Refill: 6 - Comprehensive metabolic panel  4. Hyperlipidemia, unspecified hyperlipidemia type - atorvastatin (LIPITOR) 40 MG tablet; TAKE 1/2 TABLETS BY MOUTH DAILY.  Dispense: 30 tablet; Refill: 6  5. Language barrier AMN interpreters used and additional time performing visit was required.   6. Poor compliance with medication Compliance imperative     Patient have been counseled extensively about nutrition and exercise  Return for 3 weeks with Lurena Joiner;  3 months with PCP.  The patient was given clear instructions to go to ER or return to medical center if symptoms  don't improve, worsen or new problems develop. The patient verbalized understanding. The patient was told to call to get lab results if they haven't heard anything in the next week.     Billy Caldron, Gregory [facility] [place], [place]   10/12/2020, 2:35 PM

## 2020-10-13 ENCOUNTER — Telehealth: Payer: Self-pay | Admitting: Family Medicine

## 2020-10-13 ENCOUNTER — Other Ambulatory Visit: Payer: Self-pay

## 2020-10-13 NOTE — Telephone Encounter (Signed)
Patient was not started on insulin. Insulin was given as a one time dose in clinic.

## 2020-10-13 NOTE — Telephone Encounter (Signed)
Pt friend Basilia Jumbo is calling and pt was seen yesterday and needs new rx for syringes for his insulin. Pharm chw

## 2020-11-16 ENCOUNTER — Other Ambulatory Visit: Payer: Self-pay

## 2020-11-17 ENCOUNTER — Other Ambulatory Visit: Payer: Self-pay

## 2020-11-17 ENCOUNTER — Other Ambulatory Visit: Payer: Self-pay | Admitting: Family Medicine

## 2020-11-17 DIAGNOSIS — E119 Type 2 diabetes mellitus without complications: Secondary | ICD-10-CM

## 2020-11-17 MED ORDER — GLUCOSE BLOOD VI STRP
ORAL_STRIP | 6 refills | Status: DC
  Filled 2020-11-17: qty 100, 90d supply, fill #0
  Filled 2021-03-14: qty 100, 90d supply, fill #1

## 2020-11-17 MED ORDER — TRUEPLUS LANCETS 28G MISC
6 refills | Status: DC
  Filled 2020-11-17: qty 100, 90d supply, fill #0
  Filled 2021-03-15: qty 100, 90d supply, fill #1

## 2020-11-20 ENCOUNTER — Other Ambulatory Visit: Payer: Self-pay

## 2020-11-22 ENCOUNTER — Other Ambulatory Visit: Payer: Self-pay

## 2020-12-18 IMAGING — US US ABDOMEN LIMITED
1 series · 14 of 25 positions shown · non-contrast
Comparison: None.

CLINICAL DATA: Hyperbilirubinemia

EXAM:
ULTRASOUND ABDOMEN LIMITED RIGHT UPPER QUADRANT

[Series 1: us abdomen limited · 14 of 68 slices shown]
[im 1/68]
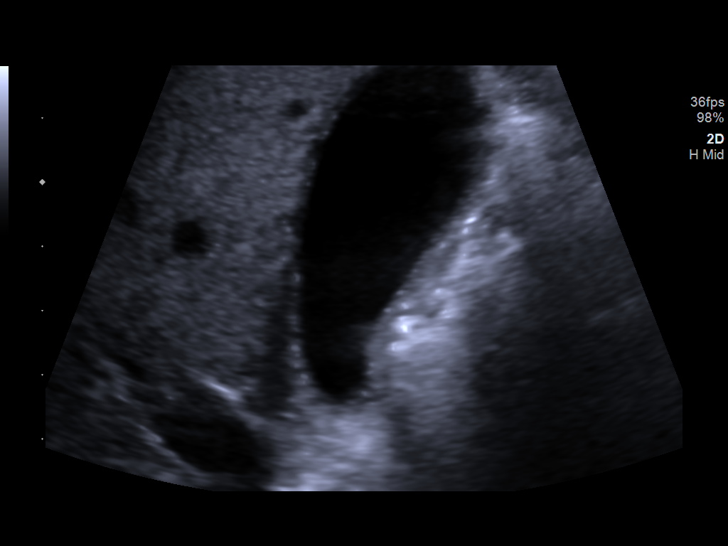
[im 6/68]
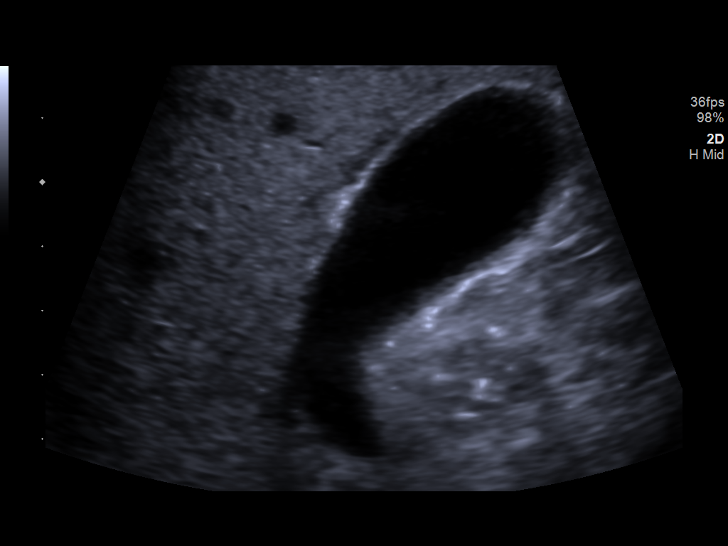
[im 12/68]
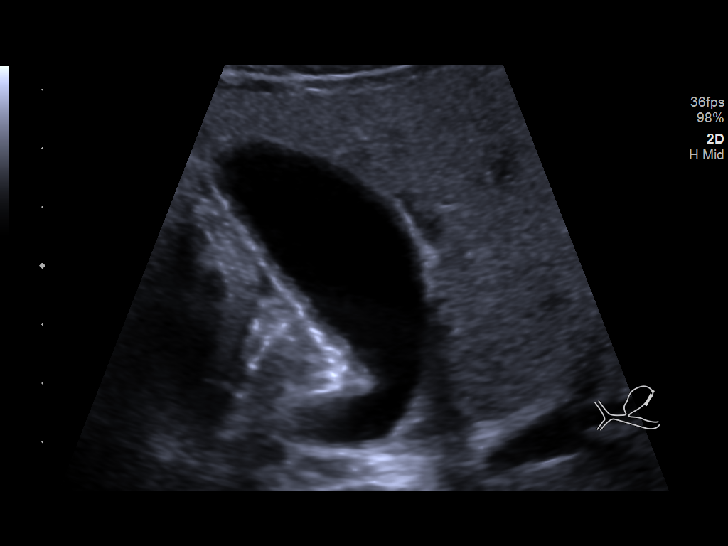
[im 17/68]
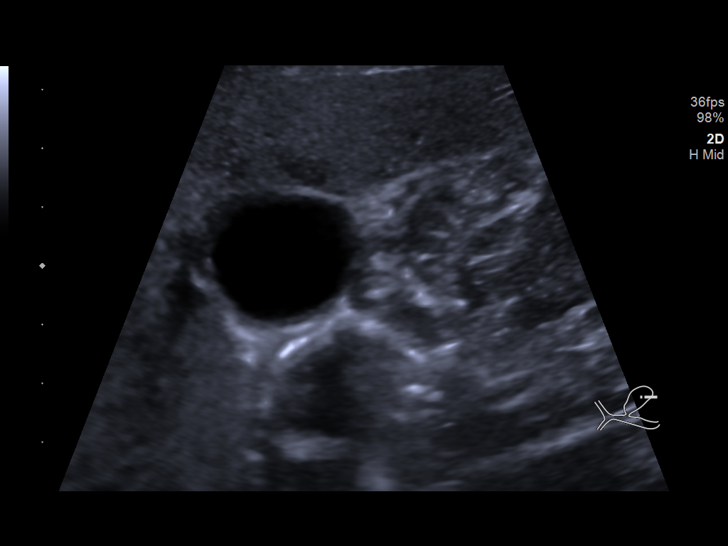
[im 23/68]
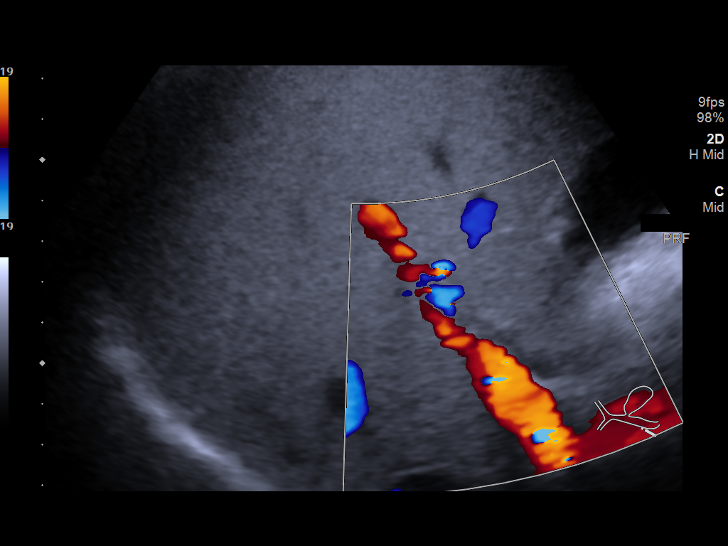
[im 26/68]
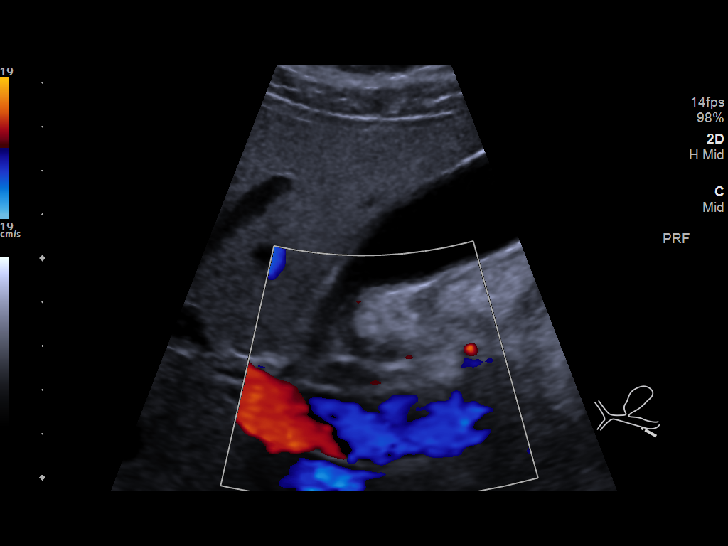
[im 31/68]
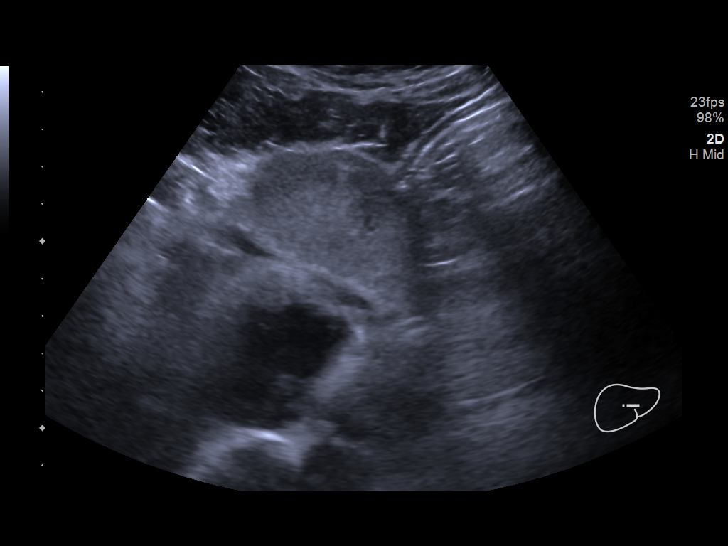
[im 37/68]
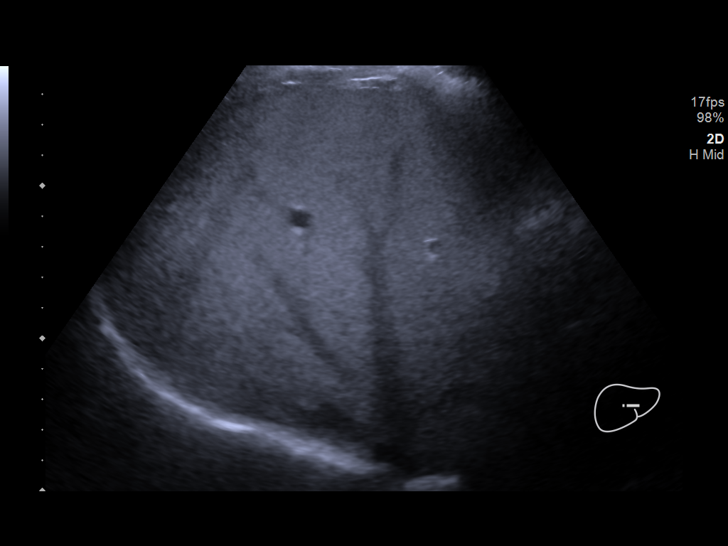
[im 42/68]
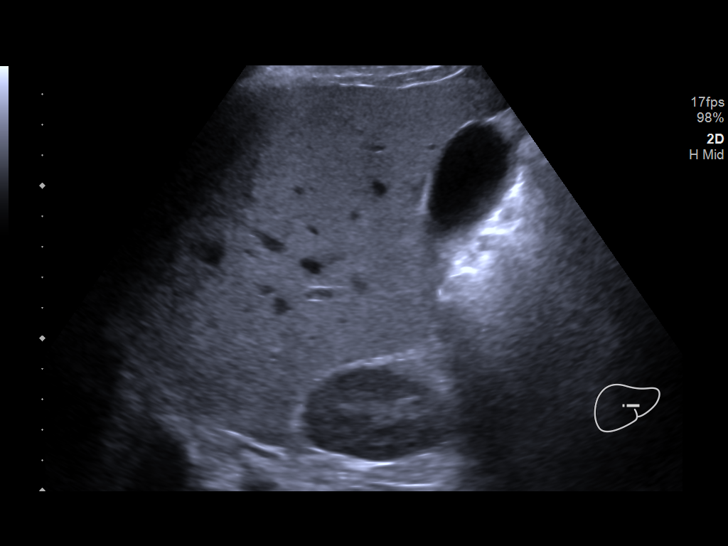
[im 45/68]
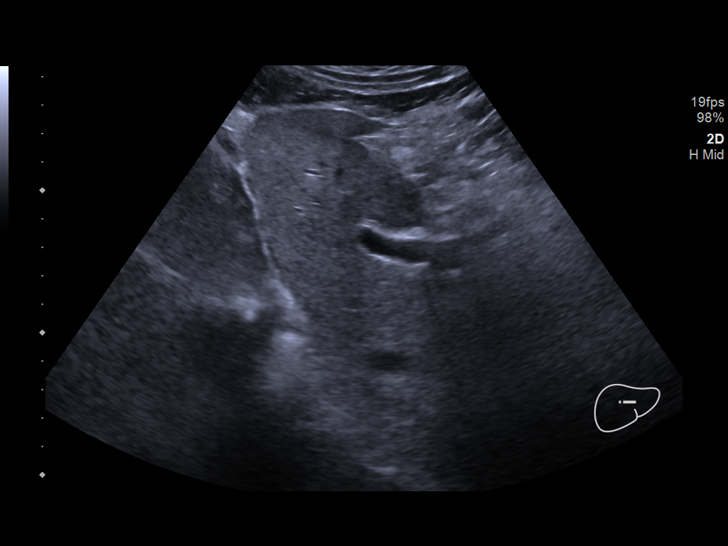
[im 51/68]
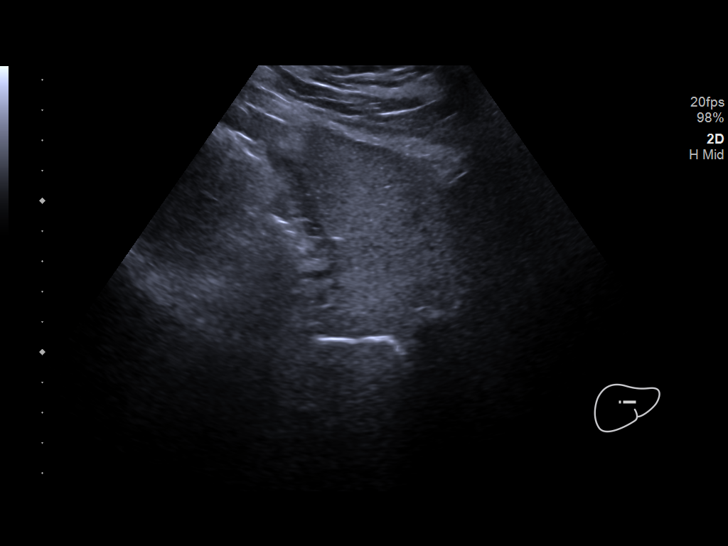
[im 56/68]
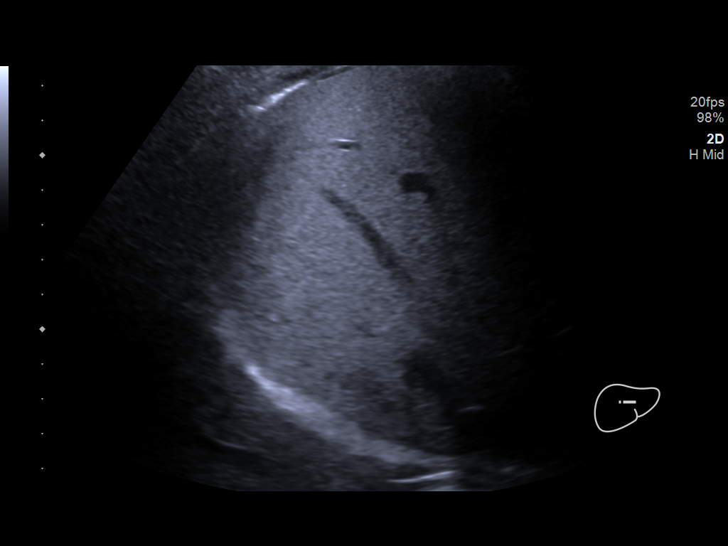
[im 62/68]
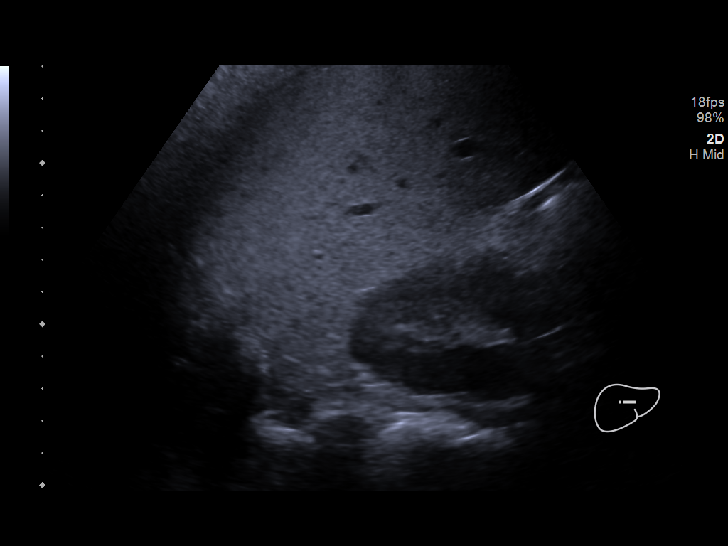
[im 68/68]
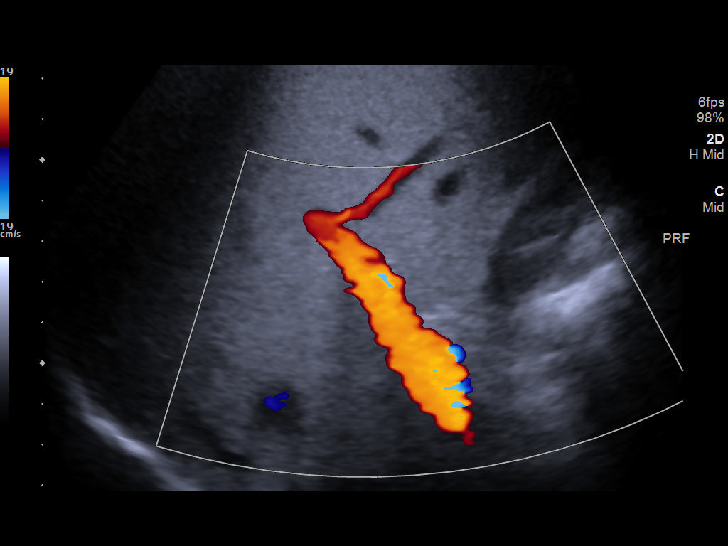

[14 of 25 positions shown; findings below may reference images not displayed]

FINDINGS: Gallbladder:

No gallstones or wall thickening visualized. No sonographic Murphy
sign noted by sonographer.

Common bile duct:

Diameter: 3.9 mm, the distal bile measures 5mm.

Liver:

Increased echotexture seen throughout. No focal abnormality or
biliary ductal dilatation. Portal vein is patent on color Doppler
imaging with normal direction of blood flow towards the liver.

Other: None.
IMPRESSION: Normal appearing gallbladder.

Hepatic steatosis.

## 2020-12-27 ENCOUNTER — Other Ambulatory Visit: Payer: Self-pay

## 2021-01-23 ENCOUNTER — Ambulatory Visit: Payer: Self-pay | Admitting: Family Medicine

## 2021-01-30 ENCOUNTER — Other Ambulatory Visit: Payer: Self-pay

## 2021-02-20 ENCOUNTER — Other Ambulatory Visit: Payer: Self-pay

## 2021-02-22 ENCOUNTER — Other Ambulatory Visit: Payer: Self-pay

## 2021-02-26 ENCOUNTER — Other Ambulatory Visit: Payer: Self-pay

## 2021-03-14 ENCOUNTER — Other Ambulatory Visit: Payer: Self-pay

## 2021-03-15 ENCOUNTER — Other Ambulatory Visit: Payer: Self-pay

## 2021-04-03 ENCOUNTER — Other Ambulatory Visit: Payer: Self-pay

## 2021-05-10 ENCOUNTER — Other Ambulatory Visit: Payer: Self-pay | Admitting: Physician Assistant

## 2021-05-10 ENCOUNTER — Other Ambulatory Visit: Payer: Self-pay

## 2021-05-10 DIAGNOSIS — E1165 Type 2 diabetes mellitus with hyperglycemia: Secondary | ICD-10-CM

## 2021-05-10 NOTE — Telephone Encounter (Signed)
Requested medication (s) are due for refill today: yes  Requested medication (s) are on the active medication list: yes  Last refill:  10/12/20 #60 with 3 RF  Future visit scheduled: NO SHOW 01/23/21, should have been out of med in Oct.  Notes to clinic:  Failed protocol of creatinine within 360 days, NO SHOW and no upcoming appt, please assess.   Requested Prescriptions  Pending Prescriptions Disp Refills   glipiZIDE (GLUCOTROL) 5 MG tablet 60 tablet 3    Sig: Take 1 tablet (5 mg total) by mouth 2 (two) times daily before a meal.     Endocrinology:  Diabetes - Sulfonylureas Failed - 05/10/2021 11:14 AM      Failed - HBA1C is between 0 and 7.9 and within 180 days    HbA1c, POC (controlled diabetic range)  Date Value Ref Range Status  10/12/2020 12.2 (A) 0.0 - 7.0 % Final          Failed - Cr in normal range and within 360 days    Creat  Date Value Ref Range Status  06/11/2016 0.83 0.60 - 1.35 mg/dL Final   Creatinine, Ser  Date Value Ref Range Status  04/14/2020 0.91 0.76 - 1.27 mg/dL Final   Creatinine, Urine  Date Value Ref Range Status  03/04/2016 136 20 - 370 mg/dL Final          Failed - Valid encounter within last 6 months    Recent Outpatient Visits           7 months ago Type 2 diabetes mellitus with hyperglycemia, unspecified whether long term insulin use [facility])   [place], [place], [place]   11 months ago [place], Dalbert Batman, MD   1 year ago [place], [place], MD   1 year ago Type 2 diabetes mellitus without complication, without long-term current use of insulin ([place])   [place], Charlane Ferretti, MD   2 years ago Type 2 diabetes mellitus without complication, without long-term current use of insulin ([place])    [facility] Charlott Rakes, MD

## 2021-05-11 ENCOUNTER — Other Ambulatory Visit: Payer: Self-pay

## 2021-05-30 ENCOUNTER — Other Ambulatory Visit: Payer: Self-pay

## 2021-06-01 ENCOUNTER — Other Ambulatory Visit: Payer: Self-pay

## 2021-06-04 ENCOUNTER — Other Ambulatory Visit: Payer: Self-pay

## 2021-06-06 ENCOUNTER — Other Ambulatory Visit: Payer: Self-pay

## 2021-07-12 ENCOUNTER — Other Ambulatory Visit: Payer: Self-pay | Admitting: Physician Assistant

## 2021-07-12 ENCOUNTER — Other Ambulatory Visit: Payer: Self-pay

## 2021-07-12 DIAGNOSIS — E1165 Type 2 diabetes mellitus with hyperglycemia: Secondary | ICD-10-CM

## 2021-07-12 NOTE — Telephone Encounter (Signed)
Requested medication (s) are due for refill t oday: yes ? ?Requested medication (s) are on the active medication list: yes ? ?Last refill:  10/12/20 ? ?Future visit scheduled: no ? ?Notes to clinic:  needs lab work ? ? ?Requested Prescriptions  ?Pending Prescriptions Disp Refills  ? glipiZIDE (GLUCOTROL) 5 MG tablet 60 tablet 3  ?  Sig: Take 1 tablet (5 mg total) by mouth 2 (two) times daily before a meal.  ?  ? Endocrinology:  Diabetes - Sulfonylureas Failed - 07/12/2021 10:16 AM  ?  ?  Failed - HBA1C is between 0 and 7.9 and within 180 days  ?  HbA1c, POC (controlled diabetic range)  ?Date Value Ref Range Status  ?10/12/2020 12.2 (A) 0.0 - 7.0 % Final  ?  ?  ?  ?  Failed - Cr in normal range and within 360 days  ?  Creat  ?Date Value Ref Range Status  ?06/11/2016 0.83 0.60 - 1.35 mg/dL Final  ? ?Creatinine, Ser  ?Date Value Ref Range Status  ?04/14/2020 0.91 0.76 - 1.27 mg/dL Final  ? ?Creatinine, Urine  ?Date Value Ref Range Status  ?03/04/2016 136 20 - 370 mg/dL Final  ?  ?  ?  ?  Failed - Valid encounter within last 6 months  ?  Recent Outpatient Visits   ? ?      ? 9 months ago Type 2 diabetes mellitus with hyperglycemia, unspecified whether long term insulin use ([place])  ? [place], [place], [place]  ? 1 year ago Balanitis  ? [place] Ladell Pier, MD  ? 1 year ago Tremor  ? [place] [facility] [facility] [place], Charlane Ferretti, MD  ? 2 years ago Type 2 diabetes mellitus without complication, without long-term current use of insulin ([place])  ?  [facility] [place], Charlane Ferretti, MD  ? 2 years ago Type 2 diabetes mellitus without complication, without long-term current use of insulin ([place])  ? [place] Charlott Rakes, MD  ? ?  ?  ? ? ?  ?  ?  ? ? ? ? ?

## 2021-07-13 ENCOUNTER — Other Ambulatory Visit: Payer: Self-pay

## 2021-07-13 MED ORDER — GLIPIZIDE 5 MG PO TABS
5.0000 mg | ORAL_TABLET | Freq: Two times a day (BID) | ORAL | 0 refills | Status: DC
  Filled 2021-07-13: qty 60, 30d supply, fill #0

## 2021-07-13 NOTE — Telephone Encounter (Signed)
Via interpreter Tim[place]o Expose [phone] voicemail was left for pt to call back to schedule appt prior to [place] being able to fill his med request. ?

## 2021-08-02 ENCOUNTER — Other Ambulatory Visit: Payer: Self-pay

## 2021-08-02 ENCOUNTER — Encounter: Payer: Self-pay | Admitting: Physician Assistant

## 2021-08-02 ENCOUNTER — Ambulatory Visit: Payer: Self-pay | Attending: Family Medicine | Admitting: Physician Assistant

## 2021-08-02 VITALS — BP 152/80 | HR 76 | Temp 98.9°F | Resp 18 | Ht 64.0 in | Wt 155.0 lb

## 2021-08-02 DIAGNOSIS — E119 Type 2 diabetes mellitus without complications: Secondary | ICD-10-CM

## 2021-08-02 DIAGNOSIS — I10 Essential (primary) hypertension: Secondary | ICD-10-CM

## 2021-08-02 DIAGNOSIS — E559 Vitamin D deficiency, unspecified: Secondary | ICD-10-CM

## 2021-08-02 DIAGNOSIS — E781 Pure hyperglyceridemia: Secondary | ICD-10-CM

## 2021-08-02 DIAGNOSIS — E1165 Type 2 diabetes mellitus with hyperglycemia: Secondary | ICD-10-CM

## 2021-08-02 DIAGNOSIS — E785 Hyperlipidemia, unspecified: Secondary | ICD-10-CM

## 2021-08-02 LAB — POCT GLYCOSYLATED HEMOGLOBIN (HGB A1C): Hemoglobin A1C: 10.6 % — AB (ref 4.0–5.6)

## 2021-08-02 LAB — GLUCOSE, POCT (MANUAL RESULT ENTRY): POC Glucose: 197 mg/dl — AB (ref 70–99)

## 2021-08-02 MED ORDER — METFORMIN HCL 500 MG PO TABS
ORAL_TABLET | Freq: Two times a day (BID) | ORAL | 6 refills | Status: DC
  Filled 2021-08-02: qty 120, 30d supply, fill #0
  Filled 2021-10-10: qty 120, 30d supply, fill #1
  Filled 2021-12-17: qty 120, 30d supply, fill #2
  Filled 2022-02-25: qty 120, 30d supply, fill #3
  Filled 2022-05-02: qty 360, 90d supply, fill #4

## 2021-08-02 MED ORDER — GLIPIZIDE 10 MG PO TABS
10.0000 mg | ORAL_TABLET | Freq: Two times a day (BID) | ORAL | 3 refills | Status: DC
  Filled 2021-08-02: qty 60, 30d supply, fill #0
  Filled 2021-09-11: qty 60, 30d supply, fill #1
  Filled 2021-10-10: qty 60, 30d supply, fill #2
  Filled 2021-11-13: qty 60, 30d supply, fill #3

## 2021-08-02 MED ORDER — ATORVASTATIN CALCIUM 40 MG PO TABS
ORAL_TABLET | ORAL | 6 refills | Status: DC
  Filled 2021-08-02: qty 30, fill #0
  Filled 2021-08-17: qty 30, 60d supply, fill #0
  Filled 2021-10-10: qty 30, 60d supply, fill #1
  Filled 2021-12-17: qty 15, 30d supply, fill #2
  Filled 2022-01-14: qty 15, 30d supply, fill #3
  Filled 2022-02-25: qty 15, 30d supply, fill #4
  Filled 2022-03-29: qty 15, 30d supply, fill #5
  Filled 2022-05-02: qty 15, 30d supply, fill #6
  Filled 2022-06-07: qty 15, 30d supply, fill #7
  Filled 2022-07-12: qty 15, 30d supply, fill #8

## 2021-08-02 MED ORDER — TRUEPLUS LANCETS 28G MISC
6 refills | Status: DC
  Filled 2021-08-02: qty 100, 90d supply, fill #0
  Filled 2021-10-11: qty 100, 90d supply, fill #1
  Filled 2022-01-14: qty 100, 90d supply, fill #2
  Filled 2022-06-07: qty 100, 90d supply, fill #3

## 2021-08-02 MED ORDER — LISINOPRIL-HYDROCHLOROTHIAZIDE 20-25 MG PO TABS
1.0000 | ORAL_TABLET | Freq: Every day | ORAL | 6 refills | Status: DC
  Filled 2021-08-02 – 2021-08-17 (×2): qty 30, 30d supply, fill #0
  Filled 2021-09-11: qty 30, 30d supply, fill #1
  Filled 2021-10-10: qty 30, 30d supply, fill #2
  Filled 2021-11-13: qty 30, 30d supply, fill #3
  Filled 2021-12-17: qty 30, 30d supply, fill #4
  Filled 2022-01-14: qty 30, 30d supply, fill #5
  Filled 2022-02-25: qty 30, 30d supply, fill #6

## 2021-08-02 MED ORDER — GLUCOSE BLOOD VI STRP
ORAL_STRIP | 6 refills | Status: DC
  Filled 2021-08-02: qty 100, 90d supply, fill #0
  Filled 2021-10-11: qty 100, 90d supply, fill #1
  Filled 2022-01-14: qty 100, 90d supply, fill #2
  Filled 2022-03-29: qty 100, 90d supply, fill #3

## 2021-08-02 NOTE — Patient Instructions (Signed)
Check blood sugar fasting and at bedtime and record and bring to your next visit.  ? ?Drink 80-100 ounces water daily. ? ?Avoid things high in sugar:  such as drinking alcohol, soft drinks, desserts, candys, starchy/processed foods ?

## 2021-08-02 NOTE — Progress Notes (Signed)
Patient ID: Billy Gregory, male   DOB: 1980/10/14, 41 y.o.   MRN: [id number] ? ? ?Billy Gregory, is a 41 y.o. male ? ?KYH:[id number] ? ?[id number] ? ?DOB - [birth year] ? ?Chief Complaint  ?Patient presents with  ? Medication Refill  ?  DM/HTN  ?    ? ?Subjective:  ? ?Billy Gregory is a 41 y.o. male here today for med RF/diabetes check.  He reports compliance with medications.  Needs test strips and med RF.  Wife and daughter are here with him.  Not compliant with diabetic diet.   ? ?No problems updated. ? ?ALLERGIES: ?No Known Allergies ? ?PAST MEDICAL HISTORY: ?Past Medical History:  ?Diagnosis Date  ? Diabetes mellitus without complication ([place])   ? Hypertension   ? ? ?MEDICATIONS AT HOME: ?Prior to Admission medications   ?Medication Sig Start Date End Date Taking? Authorizing Provider  ?acetaminophen (TYLENOL) 325 MG tablet Take 650 mg by mouth every 6 (six) hours as needed.   Yes [provider]  ?Blood Glucose Monitoring Suppl (TRUE METRIX METER) w/Device KIT USE AS INSTRUCTED 02/08/19  Yes Charlott Rakes, MD  ?glipiZIDE (GLUCOTROL) 10 MG tablet Take 1 tablet (10 mg total) by mouth 2 (two) times daily before a meal. 08/02/21  Yes Gloriajean Okun M, PA-C  ?insulin aspart (NOVOLOG) 100 UNIT/ML injection Inject 20 Units into the skin once for 1 dose. 10/12/20 08/02/21 Yes Nikoletta Varma, Dionne Bucy, PA-C  ?miconazole (MICOTIN) 2 % cream Apply 1 application topically 2 (two) times daily. 05/18/20  Yes Ladell Pier, MD  ?atorvastatin (LIPITOR) 40 MG tablet TAKE 1/2 TABLETS BY MOUTH DAILY. 08/02/21 08/02/22  Argentina Donovan, PA-C  ?cholecalciferol (VITAMIN D) 1000 units tablet Take 5,000 Units by mouth daily. ?Patient not taking: Reported on 08/02/2021    [provider]  ?glucose blood test strip TEST YOUR BLOOD SUGAR ONCE DAILY IN THE MORNING 08/02/21   Argentina Donovan, PA-C  ?lisinopril-hydrochlorothiazide (ZESTORETIC) 20-25 MG tablet TAKE 1 TABLET BY MOUTH DAILY. 08/02/21  08/02/22  Argentina Donovan, PA-C  ?metFORMIN (GLUCOPHAGE) 500 MG tablet TAKE 2 TABLETS (1,000 MG TOTAL) BY MOUTH 2 (TWO) TIMES DAILY WITH A MEAL. 08/02/21 08/02/22  Argentina Donovan, PA-C  ?TRUEplus Lancets 28G MISC USE TO CHECK BLOOD SUGARS DAILY. 08/02/21   Argentina Donovan, PA-C  ? ? ?ROS: ?Neg HEENT ?Neg resp ?Neg cardiac ?Neg GI ?Neg GU ?Neg MS ?Neg psych ?Neg neuro ? ?Objective:  ? ?Vitals:  ? 08/02/21 1002  ?BP: (!) 152/80  ?Pulse: 76  ?Resp: 18  ?Temp: 98.9 ?F (37.2 ?C)  ?TempSrc: Oral  ?SpO2: 100%  ?Weight: 155 lb (70.3 kg)  ?Height: 5' 4"  (1.626 m)  ? ?Exam ?General appearance : Awake, alert, not in any distress. Speech Clear. Not toxic looking ?HEENT: Atraumatic and Normocephalic ?Neck: Supple, no JVD. No cervical lymphadenopathy.  ?Chest: Good air entry bilaterally, CTAB.  No rales/rhonchi/wheezing ?CVS: S1 S2 regular, no murmurs.  ?Extremities: B/L Lower Ext shows no edema, both legs are warm to touch ?Neurology: Awake alert, and oriented X 3, CN II-XII intact, Non focal ?Skin: No Rash ? ?Data Review ?Lab Results  ?Component Value Date  ? HGBA1C 10.6 (A) 08/02/2021  ? HGBA1C 12.2 (A) 10/12/2020  ? HGBA1C 9.2 (H) 04/14/2020  ? ? ?Assessment & Plan  ? ?1. Type 2 diabetes mellitus with hyperglycemia, unspecified whether long term insulin use ([place]) ?Improved but far from goal.  I have discussed with him and his wife at length the complications and  co morbidities associated with uncontrolled diabetes.  Increase glipizide to 11m bid ?Check blood sugar fasting and at bedtime and record and bring to your next visit.  ?Drink 80-100 ounces water daily. ?Avoid things high in sugar:  such as drinking alcohol, soft drinks, desserts, candys, starchy/processed foods ?- Glucose (CBG) ?- HgB A1c ?- glipiZIDE (GLUCOTROL) 10 MG tablet; Take 1 tablet (10 mg total) by mouth 2 (two) times daily before a meal.  Dispense: 60 tablet; Refill: 3 ?- Comprehensive metabolic panel ?- CBC with Differential/Platelet ?- metFORMIN  (GLUCOPHAGE) 500 MG tablet; TAKE 2 TABLETS (1,000 MG TOTAL) BY MOUTH 2 (TWO) TIMES DAILY WITH A MEAL.  Dispense: 120 tablet; Refill: 6 ?- atorvastatin (LIPITOR) 40 MG tablet; TAKE 1/2 TABLETS BY MOUTH DAILY.  Dispense: 30 tablet; Refill: 6 ? ?2. HTN (hypertension), benign ?Not at goal-did not take meds today ?- lisinopril-hydrochlorothiazide (ZESTORETIC) 20-25 MG tablet; TAKE 1 TABLET BY MOUTH DAILY.  Dispense: 30 tablet; Refill: 6 ? ?3. Hypertriglyceridemia ?- atorvastatin (LIPITOR) 40 MG tablet; TAKE 1/2 TABLETS BY MOUTH DAILY.  Dispense: 30 tablet; Refill: 6 ? ?4. Hyperlipidemia, unspecified hyperlipidemia type ?- Lipid panel ?- CBC with Differential/Platelet ?- atorvastatin (LIPITOR) 40 MG tablet; TAKE 1/2 TABLETS BY MOUTH DAILY.  Dispense: 30 tablet; Refill: 6 ? ?5. Vitamin D deficiency ?- Vitamin D, 25-hydroxy ? ?6. Type 2 diabetes mellitus without complication, without long-term current use of insulin ([place] ?- TRUEplus Lancets 28G MISC; USE TO CHECK BLOOD SUGARS DAILY.  Dispense: 100 each; Refill: 6 ?- glucose blood test strip; TEST YOUR BLOOD SUGAR ONCE DAILY IN THE MORNING  Dispense: 100 each; Refill: 6 ? ?AMN interpreters "MFreida Busman used and additional time performing visit was required. ? ? ?Return for 3-4 weeks with LLurena Joinerfor DM and htn;  3 months with PCP. ? ?The patient was given clear instructions to go to ER or return to medical center if symptoms don't improve, worsen or new problems develop. The patient verbalized understanding. The patient was told to call to get lab results if they haven't heard anything in the next week.  ? ? ? ? ?AFreeman Caldron PA-C ?[place]?[phone]  ?08/02/2021, 10:23 AM  ?

## 2021-08-02 NOTE — Progress Notes (Signed)
Patient has not eaten or taken medication today. ?Patient request refills on DM supplies and chronic medications. ?Patient denies pain at this time. ? ?

## 2021-08-03 LAB — CBC WITH DIFFERENTIAL/PLATELET
Basophils Absolute: 0.1 10*3/uL (ref 0.0–0.2)
Basos: 1 %
EOS (ABSOLUTE): 0.3 10*3/uL (ref 0.0–0.4)
Eos: 4 %
Hematocrit: 42.7 % (ref 37.5–51.0)
Hemoglobin: 14.9 g/dL (ref 13.0–17.7)
Immature Grans (Abs): 0 10*3/uL (ref 0.0–0.1)
Immature Granulocytes: 0 %
Lymphocytes Absolute: 2.3 10*3/uL (ref 0.7–3.1)
Lymphs: 27 %
MCH: 29.7 pg (ref 26.6–33.0)
MCHC: 34.9 g/dL (ref 31.5–35.7)
MCV: 85 fL (ref 79–97)
Monocytes Absolute: 0.7 10*3/uL (ref 0.1–0.9)
Monocytes: 9 %
Neutrophils Absolute: 5 10*3/uL (ref 1.4–7.0)
Neutrophils: 59 %
Platelets: 213 10*3/uL (ref 150–450)
RBC: 5.01 x10E6/uL (ref 4.14–5.80)
RDW: 12.5 % (ref 11.6–15.4)
WBC: 8.4 10*3/uL (ref 3.4–10.8)

## 2021-08-03 LAB — COMPREHENSIVE METABOLIC PANEL WITH GFR
ALT: 16 IU/L (ref 0–44)
AST: 17 IU/L (ref 0–40)
Albumin/Globulin Ratio: 1.8 (ref 1.2–2.2)
Albumin: 4.4 g/dL (ref 4.0–5.0)
Alkaline Phosphatase: 62 IU/L (ref 44–121)
BUN/Creatinine Ratio: 16 (ref 9–20)
BUN: 13 mg/dL (ref 6–24)
Bilirubin Total: 1.3 mg/dL — ABNORMAL HIGH (ref 0.0–1.2)
CO2: 24 mmol/L (ref 20–29)
Calcium: 9.6 mg/dL (ref 8.7–10.2)
Chloride: 104 mmol/L (ref 96–106)
Creatinine, Ser: 0.82 mg/dL (ref 0.76–1.27)
Globulin, Total: 2.5 g/dL (ref 1.5–4.5)
Glucose: 178 mg/dL — ABNORMAL HIGH (ref 70–99)
Potassium: 4.4 mmol/L (ref 3.5–5.2)
Sodium: 139 mmol/L (ref 134–144)
Total Protein: 6.9 g/dL (ref 6.0–8.5)
eGFR: 112 mL/min/1.73

## 2021-08-03 LAB — LIPID PANEL
Chol/HDL Ratio: 3.1 ratio (ref 0.0–5.0)
Cholesterol, Total: 110 mg/dL (ref 100–199)
HDL: 35 mg/dL — ABNORMAL LOW
LDL Chol Calc (NIH): 60 mg/dL (ref 0–99)
Triglycerides: 70 mg/dL (ref 0–149)
VLDL Cholesterol Cal: 15 mg/dL (ref 5–40)

## 2021-08-03 LAB — VITAMIN D 25 HYDROXY (VIT D DEFICIENCY, FRACTURES): Vit D, 25-Hydroxy: 37.2 ng/mL (ref 30.0–100.0)

## 2021-08-17 ENCOUNTER — Other Ambulatory Visit: Payer: Self-pay

## 2021-09-06 ENCOUNTER — Encounter: Payer: Self-pay | Admitting: Pharmacist

## 2021-09-06 ENCOUNTER — Other Ambulatory Visit: Payer: Self-pay

## 2021-09-06 ENCOUNTER — Ambulatory Visit: Payer: Self-pay | Attending: Family Medicine | Admitting: Pharmacist

## 2021-09-06 VITALS — BP 129/85 | HR 94

## 2021-09-06 DIAGNOSIS — E1165 Type 2 diabetes mellitus with hyperglycemia: Secondary | ICD-10-CM

## 2021-09-06 MED ORDER — EMPAGLIFLOZIN 10 MG PO TABS
10.0000 mg | ORAL_TABLET | Freq: Every day | ORAL | 3 refills | Status: DC
  Filled 2021-09-06 – 2021-10-15 (×2): qty 30, 30d supply, fill #0

## 2021-09-06 NOTE — Progress Notes (Signed)
    S:    Billy Gregory is a 41 y.o. male who presents for diabetes evaluation, education, and management. PMH is significant for HTN, T2DM (since 2016), and DLD.  Primary Care Provider is Dr. Margarita Rana. Patient was referred and last seen by Freeman Caldron for HTN and DM. At last visit, lisinopril/HCTZ added for BP and increased glipizide dose.  Today, patient arrives in good spirits and presents with wife and daughter. Used spanish interpreter during visit today. No complaints today. Did note that his glucometer sometimes gives different readings when he checks, but confirmed he does not wipe away the first drop of blood (one exposed to alcohol).   Patient reports taking all medications as prescribed. Patient reports adherence with medications and no issues getting medications from pharmacy. Denies side effects and feels medications are working well for him. Patient has noted improvement in blood glucose and symptoms since started medications.Patient denies hypoglycemic events, polyuria, neuropathy, or visual changes.  Current diabetes medications include: glipizide '10mg'$  BID (only takes with food) and metformin '1000mg'$  BID Current hypertension medications include: lisinopril '20mg'$ /HCTZ '25mg'$  daily  Brought blood glucose log from home. Patient is checking twice daily Reported home fasting blood sugars: 120s-130s (some outliers ~150-160s)  Reported 2 hour post-meal/random blood sugars: all less than 180, mostly 160s  Patient reported dietary habits: Eats 3 meals/day Patient-reported exercise habits: works outside  O:  [employer] Value Date   HGBA1C 10.6 (A) 08/02/2021   Vitals:   09/06/21 1120  BP: 129/85  Pulse: 94   Lipid Panel     Component Value Date/Time   CHOL 110 08/02/2021 1035   TRIG 70 08/02/2021 1035   HDL 35 (L) 08/02/2021 1035   CHOLHDL 3.1 08/02/2021 1035   CHOLHDL 4.1 06/11/2016 1441   VLDL 26 06/11/2016 1441   LDLCALC 60 08/02/2021 1035   Clinical  Atherosclerotic Cardiovascular Disease (ASCVD):  The ASCVD Risk score (Arnett DK, et al., 2019) failed to calculate for the following reasons:   The valid total cholesterol range is 130 to 320 mg/dL   A/P: Diabetes longstanding currently above goal with last A1c 10.6. Per patients home blood glucose reading, diabetes is improving with current medications. Still slightly supra-therapeutic so I will add an additional agent to meet goal blood glucoses and hopefully show improved A1c at next check. Patient does wish to avoid injectables if possible.  -Start Jardiance '10mg'$  daily. Potential to increase dose at next visit.   -Patient educated on purpose, proper use, and potential adverse effects of jardance.  -Continue other medications  -Extensively discussed pathophysiology of diabetes, recommended lifestyle interventions, dietary effects on blood sugar control.  -Continue to check blood glucose at home. Reviewed technique including wiping away first drop of blood after using alcohol swab. Encouraged to bring log to next visit. -Next A1c anticipated 10/2021.   Hypertension longstanding currently at goal. Blood pressure goal of <130/80 mmHg. Medication adherence appropriate.  -Check CMP today to make sure renal function stable. -Continue lisinopril '20mg'$ /HCTZ '25mg'$   Written patient instructions provided. Patient verbalized understanding of treatment plan. Total time in face to face counseling 35 minutes.    Follow up with pharmacist in 1 month and PCP in August 2023.  Thank you for allowing pharmacy to participate in this patient's care.  Levonne Spiller, PharmD PGY1 Acute Care Resident  09/06/2021,12:08 PM

## 2021-09-07 LAB — CMP14+EGFR
ALT: 16 IU/L (ref 0–44)
AST: 14 IU/L (ref 0–40)
Albumin/Globulin Ratio: 1.6 (ref 1.2–2.2)
Albumin: 4.4 g/dL (ref 4.0–5.0)
Alkaline Phosphatase: 46 IU/L (ref 44–121)
BUN/Creatinine Ratio: 16 (ref 9–20)
BUN: 12 mg/dL (ref 6–24)
Bilirubin Total: 1.1 mg/dL (ref 0.0–1.2)
CO2: 22 mmol/L (ref 20–29)
Calcium: 9.4 mg/dL (ref 8.7–10.2)
Chloride: 102 mmol/L (ref 96–106)
Creatinine, Ser: 0.76 mg/dL (ref 0.76–1.27)
Globulin, Total: 2.8 g/dL (ref 1.5–4.5)
Glucose: 126 mg/dL — ABNORMAL HIGH (ref 70–99)
Potassium: 4.1 mmol/L (ref 3.5–5.2)
Sodium: 139 mmol/L (ref 134–144)
Total Protein: 7.2 g/dL (ref 6.0–8.5)
eGFR: 115 mL/min/{1.73_m2} (ref >59)

## 2021-09-11 ENCOUNTER — Other Ambulatory Visit: Payer: Self-pay

## 2021-09-20 ENCOUNTER — Other Ambulatory Visit: Payer: Self-pay

## 2021-10-10 ENCOUNTER — Other Ambulatory Visit: Payer: Self-pay

## 2021-10-11 ENCOUNTER — Other Ambulatory Visit: Payer: Self-pay

## 2021-10-12 ENCOUNTER — Other Ambulatory Visit: Payer: Self-pay

## 2021-10-15 ENCOUNTER — Other Ambulatory Visit: Payer: Self-pay

## 2021-10-15 ENCOUNTER — Ambulatory Visit: Payer: Self-pay | Attending: Family Medicine | Admitting: Pharmacist

## 2021-10-15 ENCOUNTER — Encounter: Payer: Self-pay | Admitting: Pharmacist

## 2021-10-15 DIAGNOSIS — E1165 Type 2 diabetes mellitus with hyperglycemia: Secondary | ICD-10-CM

## 2021-10-15 NOTE — Progress Notes (Signed)
    S:    Billy Gregory is a 41 y.o. male who presents for diabetes evaluation, education, and management. PMH is significant for HTN, T2DM (since 2016), and DLD.  Primary Care Provider is Dr. Margarita Rana. Patient was referred and last seen by Freeman Caldron for HTN and DM. We saw him on 6/15 and added Jardiance.   Today, patient arrives in good spirits . Used spanish interpreter during visit today. No complaints today. Of note, he did not pick-up Jardiance d/t pharmacy issue.   Patient reports taking all medications as prescribed with the exception of Jardiance. Denies any current side effects and feels medications are working well for him. Patient has noted improvement in blood glucose and symptoms since starting medications. Patient denies hypoglycemic events, polyuria, neuropathy, or visual changes.  Current diabetes medications include: glipizide '10mg'$  BID (only takes with food), Jardiance 10 mg daily (not taking) metformin '1000mg'$  BID Current hypertension medications include: lisinopril '20mg'$ /HCTZ '25mg'$  daily  Brought blood glucose log from home. Patient is checking twice daily Reported home fasting blood sugars: 120s-130s (some outliers ~150-160s)  Reported 2 hour post-meal/random blood sugars: all less than 180, mostly 160s  Patient reported dietary habits: Eats 3 meals/day Patient-reported exercise habits: works outside  O:  [employer] Global Value Date   HGBA1C 10.6 (A) 08/02/2021   There were no vitals filed for this visit.  Lipid Panel     Component Value Date/Time   CHOL 110 08/02/2021 1035   TRIG 70 08/02/2021 1035   HDL 35 (L) 08/02/2021 1035   CHOLHDL 3.1 08/02/2021 1035   CHOLHDL 4.1 06/11/2016 1441   VLDL 26 06/11/2016 1441   LDLCALC 60 08/02/2021 1035   Clinical Atherosclerotic Cardiovascular Disease (ASCVD):  The ASCVD Risk score (Arnett DK, et al., 2019) failed to calculate for the following reasons:   The valid total cholesterol range is 130 to 320  mg/dL   A/P: Diabetes longstanding currently above goal with last A1c 10.6. Per patient, home glucoses are close to goal. Still slightly supra-therapeutic. Encouraged patient to go downstairs and fill out Jardiance patient assistance. Patient does wish to avoid injectables if possible.  -Start Jardiance '10mg'$  daily.  -Patient educated on purpose, proper use, and potential adverse effects of jardance.  -Continue other medications  -Extensively discussed pathophysiology of diabetes, recommended lifestyle interventions, dietary effects on blood sugar control.  -Continue to check blood glucose at home. Reviewed technique including wiping away first drop of blood after using alcohol swab. Encouraged to bring log to next visit. -Next A1c anticipated 10/2021.   Hypertension longstanding currently at goal. Blood pressure goal of <130/80 mmHg. Medication adherence appropriate.  -Continue lisinopril '20mg'$ /HCTZ '25mg'$   Written patient instructions provided. Patient verbalized understanding of treatment plan. Total time in face to face counseling 30 minutes.    Follow up with PCP in August 2023.  Thank you for allowing pharmacy to participate in this patient's care.  Benard Halsted, PharmD, [place], [place] [phone]

## 2021-11-08 ENCOUNTER — Other Ambulatory Visit: Payer: Self-pay

## 2021-11-13 ENCOUNTER — Other Ambulatory Visit: Payer: Self-pay

## 2021-11-15 ENCOUNTER — Other Ambulatory Visit: Payer: Self-pay

## 2021-11-21 ENCOUNTER — Encounter: Payer: Self-pay | Admitting: Family Medicine

## 2021-11-21 ENCOUNTER — Other Ambulatory Visit: Payer: Self-pay

## 2021-11-21 ENCOUNTER — Ambulatory Visit: Payer: Self-pay | Attending: Family Medicine | Admitting: Family Medicine

## 2021-11-21 VITALS — BP 129/82 | HR 74 | Temp 98.2°F | Ht 64.0 in | Wt 159.4 lb

## 2021-11-21 DIAGNOSIS — Z1159 Encounter for screening for other viral diseases: Secondary | ICD-10-CM

## 2021-11-21 DIAGNOSIS — I10 Essential (primary) hypertension: Secondary | ICD-10-CM

## 2021-11-21 DIAGNOSIS — E1165 Type 2 diabetes mellitus with hyperglycemia: Secondary | ICD-10-CM

## 2021-11-21 DIAGNOSIS — Z23 Encounter for immunization: Secondary | ICD-10-CM

## 2021-11-21 LAB — POCT GLYCOSYLATED HEMOGLOBIN (HGB A1C): HbA1c, POC (controlled diabetic range): 6.6 % (ref 0.0–7.0)

## 2021-11-21 LAB — GLUCOSE, POCT (MANUAL RESULT ENTRY): POC Glucose: 114 mg/dl — AB (ref 70–99)

## 2021-11-21 MED ORDER — GLIPIZIDE 10 MG PO TABS
10.0000 mg | ORAL_TABLET | Freq: Two times a day (BID) | ORAL | 6 refills | Status: DC
  Filled 2021-11-21 – 2021-12-17 (×2): qty 60, 30d supply, fill #0
  Filled 2022-01-14: qty 60, 30d supply, fill #1
  Filled 2022-02-25: qty 60, 30d supply, fill #2
  Filled 2022-03-29: qty 60, 30d supply, fill #3
  Filled 2022-05-02: qty 60, 30d supply, fill #4
  Filled 2022-06-07: qty 60, 30d supply, fill #5
  Filled 2022-07-12: qty 60, 30d supply, fill #6

## 2021-11-21 NOTE — Progress Notes (Signed)
Subjective:  Patient ID: Billy Gregory, male    DOB: 1980-05-01  Age: 41 y.o. MRN: [id number]  CC: Diabetes   HPI Billy Gregory is a 41 y.o. year old male with a history of type 2 diabetes mellitus (A1c 6.6), hypertension who presents today for a follow-up visit  Interval History: Fasting sugars have ranged 120-138 and he denies presence of hypoglycemia, visual abnormalities, neuropathy symptoms. He endorses adherence with his diabetic regimen. Also doing well on his statin and his antihypertensive. He denies additional concerns today.  Past Medical History:  Diagnosis Date   Diabetes mellitus without complication ([place])    Hypertension     No past surgical history on file.  Family History  Problem Relation Age of Onset   Diabetes Paternal Aunt     Social History   Socioeconomic History   Marital status: Single    Spouse name: Not on file   Number of children: Not on file   Years of education: Not on file   Highest education level: Not on file  Occupational History    Employer: [employer]    Comment: Housekeeping  Tobacco Use   Smoking status: Never   Smokeless tobacco: Never  Substance and Sexual Activity   Alcohol use: No    Alcohol/week: 0.0 standard drinks of alcohol   Drug use: No   Sexual activity: Not on file  Other Topics Concern   Not on file  Social History Narrative   ** Merged History Encounter **       Employment: housekeeping     From [place].   Social Determinants of Health   Financial Resource Strain: Not on file  Food Insecurity: Not on file  Transportation Needs: Not on file  Physical Activity: Not on file  Stress: Not on file  Social Connections: Not on file    No Known Allergies  Outpatient Medications Prior to Visit  Medication Sig Dispense Refill   acetaminophen (TYLENOL) 325 MG tablet Take 650 mg by mouth every 6 (six) hours as needed.     atorvastatin (LIPITOR) 40 MG tablet TAKE 1/2 TABLETS BY MOUTH  DAILY. 30 tablet 6   Blood Glucose Monitoring Suppl (TRUE METRIX METER) w/Device KIT USE AS INSTRUCTED 1 kit 0   cholecalciferol (VITAMIN D) 1000 units tablet Take 5,000 Units by mouth daily.     empagliflozin (JARDIANCE) 10 MG TABS tablet Take 1 tablet (10 mg total) by mouth daily before breakfast. 30 tablet 3   glucose blood test strip TEST YOUR BLOOD SUGAR ONCE DAILY IN THE MORNING 100 each 6   lisinopril-hydrochlorothiazide (ZESTORETIC) 20-25 MG tablet TAKE 1 TABLET BY MOUTH DAILY. 30 tablet 6   metFORMIN (GLUCOPHAGE) 500 MG tablet TAKE 2 TABLETS (1,000 MG TOTAL) BY MOUTH 2 (TWO) TIMES DAILY WITH A MEAL. 120 tablet 6   miconazole (MICOTIN) 2 % cream Apply 1 application topically 2 (two) times daily. 28.35 g 0   TRUEplus Lancets 28G MISC USE TO CHECK BLOOD SUGARS DAILY. 100 each 6   glipiZIDE (GLUCOTROL) 10 MG tablet Take 1 tablet (10 mg total) by mouth 2 (two) times daily before a meal. 60 tablet 3   No facility-administered medications prior to visit.     ROS Review of Systems  Constitutional:  Negative for activity change and appetite change.  HENT:  Negative for sinus pressure and sore throat.   Respiratory:  Negative for chest tightness, shortness of breath and wheezing.   Cardiovascular:  Negative for chest pain and palpitations.  Gastrointestinal:  Negative for abdominal distention, abdominal pain and constipation.  Genitourinary: Negative.   Musculoskeletal: Negative.   Psychiatric/Behavioral:  Negative for behavioral problems and dysphoric mood.     Objective:  BP 129/82   Pulse 74   Temp 98.2 F (36.8 C) (Oral)   Ht 5' 4"  (1.626 m)   Wt 159 lb 6.4 oz (72.3 kg)   SpO2 100%   BMI 27.36 kg/m      11/21/2021    1:40 PM 09/06/2021   11:20 AM 08/02/2021   10:02 AM  BP/Weight  Systolic BP 785 885 027  Diastolic BP 82 85 80  Wt. (Lbs) 159.4  155  BMI 27.36 kg/m2  26.61 kg/m2      Physical Exam Constitutional:      Appearance: He is well-developed.   Cardiovascular:     Rate and Rhythm: Normal rate.     Heart sounds: Normal heart sounds. No murmur heard. Pulmonary:     Effort: Pulmonary effort is normal.     Breath sounds: Normal breath sounds. No wheezing or rales.  Chest:     Chest wall: No tenderness.  Abdominal:     General: Bowel sounds are normal. There is no distension.     Palpations: Abdomen is soft. There is no mass.     Tenderness: There is no abdominal tenderness.  Musculoskeletal:        General: Normal range of motion.     Right lower leg: No edema.     Left lower leg: No edema.  Neurological:     Mental Status: He is alert and oriented to person, place, and time.  Psychiatric:        Mood and Affect: Mood normal.    Diabetic Foot Exam - Simple   Simple Foot Form Diabetic Foot exam was performed with the following findings: Yes 11/21/2021  1:57 PM  Visual Inspection No deformities, no ulcerations, no other skin breakdown bilaterally: Yes Sensation Testing Intact to touch and monofilament testing bilaterally: Yes Pulse Check Posterior Tibialis and Dorsalis pulse intact bilaterally: Yes Comments         Latest Ref Rng & Units 09/06/2021   11:21 AM 08/02/2021   10:35 AM 04/14/2020    8:34 AM  CMP  Glucose 70 - 99 mg/dL 126  178  219   BUN 6 - 24 mg/dL 12  13  11    Creatinine 0.76 - 1.27 mg/dL 0.76  0.82  0.91   Sodium 134 - 144 mmol/L 139  139  140   Potassium 3.5 - 5.2 mmol/L 4.1  4.4  4.7   Chloride 96 - 106 mmol/L 102  104  102   CO2 20 - 29 mmol/L 22  24  21    Calcium 8.7 - 10.2 mg/dL 9.4  9.6  9.8   Total Protein 6.0 - 8.5 g/dL 7.2  6.9  7.6   Total Bilirubin 0.0 - 1.2 mg/dL 1.1  1.3  1.3   Alkaline Phos 44 - 121 IU/L 46  62  64   AST 0 - 40 IU/L 14  17  16    ALT 0 - 44 IU/L 16  16  28      Lipid Panel     Component Value Date/Time   CHOL 110 08/02/2021 1035   TRIG 70 08/02/2021 1035   HDL 35 (L) 08/02/2021 1035   CHOLHDL 3.1 08/02/2021 1035   CHOLHDL 4.1 06/11/2016 1441   VLDL 26  06/11/2016 1441   LDLCALC  60 08/02/2021 1035    CBC    Component Value Date/Time   WBC 8.4 08/02/2021 1035   WBC 9.2 09/19/2015 1719   RBC 5.01 08/02/2021 1035   RBC 5.08 09/19/2015 1719   HGB 14.9 08/02/2021 1035   HCT 42.7 08/02/2021 1035   PLT 213 08/02/2021 1035   MCV 85 08/02/2021 1035   MCH 29.7 08/02/2021 1035   MCH 29.3 09/19/2015 1719   MCHC 34.9 08/02/2021 1035   MCHC 35.6 09/19/2015 1719   RDW 12.5 08/02/2021 1035   LYMPHSABS 2.3 08/02/2021 1035   MONOABS 552 09/19/2015 1719   EOSABS 0.3 08/02/2021 1035   BASOSABS 0.1 08/02/2021 1035    Lab Results  Component Value Date   HGBA1C 6.6 11/21/2021    Assessment & Plan:  1. Type 2 diabetes mellitus with hyperglycemia, unspecified whether long term insulin use ([facility]) Controlled with A1c of 6.6 and he has been commended Continue current regimen Counseled on Diabetic diet, my plate method, 031 minutes of moderate intensity exercise/week Blood sugar logs with fasting goals of 80-120 mg/dl, random of less than 180 and in the event of sugars less than 60 mg/dl or greater than 400 mg/dl encouraged to notify the clinic. Advised on the need for annual eye exams, annual foot exams, Pneumonia vaccine. - POCT glucose (manual entry) - POCT glycosylated hemoglobin (Hb A1C) - Microalbumin/Creatinine Ratio, Urine - Basic Metabolic Panel - glipiZIDE (GLUCOTROL) 10 MG tablet; Take 1 tablet (10 mg total) by mouth 2 (two) times daily before a meal.  Dispense: 60 tablet; Refill: 6  2. Need for hepatitis C screening test  - HCV Ab w Reflex to Quant PCR  3. HTN (hypertension), benign Controlled Continue lisinopril/HCTZ Counseled on blood pressure goal of less than 130/80, low-sodium, DASH diet, medication compliance, 150 minutes of moderate intensity exercise per week. Discussed medication compliance, adverse effects.    Health Care Maintenance: Flu shot today Meds ordered this encounter  Medications   glipiZIDE (GLUCOTROL)  10 MG tablet    Sig: Take 1 tablet (10 mg total) by mouth 2 (two) times daily before a meal.    Dispense:  60 tablet    Refill:  6    Follow-up: Return in about 6 months (around 05/23/2022) for Chronic medical conditions.       Charlott Rakes, MD, FAAFP. [facility] and [place], [place]   11/21/2021, 1:59 PM

## 2021-11-21 NOTE — Patient Instructions (Signed)
La diabetes mellitus y el cuidado de los pies Diabetes Mellitus and Foot Care El cuidado de los pies es un aspecto importante de la salud, especialmente si tiene diabetes. La diabetes puede generar problemas debido a que el flujo sanguneo (circulacin) es deficiente en las piernas y los pies, y esto puede hacer que la piel: Se torne ms fina y Audiological scientist. Se resquebraje ms fcilmente. Cicatrice ms lentamente. Se descame y agriete. Tambin pueden estar daados los nervios (neuropata) de las piernas y de los pies, lo que provoca una disminucin de la sensibilidad. En consecuencia, es posible que no advierta heridas pequeas en los pies que pueden causar problemas ms graves. Identificar y tratar cualquier complicacin lo antes posible es la mejor manera de evitar futuros problemas de pie. Cmo cuidar los pies Higiene de los pies  [employer] pies todos los das con agua tibia y un [place]. No use agua caliente. Luego squese los pies y [employer] dedos dando palmaditas, hasta que estn completamente secos. No remoje los pies, ya que esto puede resecar la piel. Crtese las uas de los pies en lnea recta. No escarbe debajo de las uas o alrededor [employer]. Lime los bordes de las uas con una lima o esmeril. Aplique una locin hidratante o vaselina en la piel de los pies y en las uas secas y [place]. Use una locin que no contenga alcohol ni fragancias. No aplique locin entre los dedos. Zapatos y calcetines Use calcetines de algodn o medias limpias todos los [place]. Asegrese de que no le [employer]. No use calcetines que le lleguen a las rodillas, ya que podran disminuir el flujo de sangre a las piernas. Use zapatos de cuero que le queden bien y que sean acolchados. Revise siempre los zapatos antes de ponerlos para asegurarse de que no haya objetos en su interior. Para amoldar los zapatos, clcelos solo algunas horas por da. Esto evitar lesiones en los pies. Heridas, rasguos,  durezas y callosidades  Controle sus pies diariamente para observar si hay ampollas, cortes, moretones, llagas o enrojecimiento. Si no puede ver la planta del pie, use un espejo o pdale ayuda a Nurse, children's. No corte las durezas o callosidades, ni trate de quitarlas con medicamentos. Si algo le ha raspado, cortado o lastimado la piel de los pies, mantenga la piel de esa zona limpia y [place]. Puede higienizar estas zonas con agua y un jabn suave. No limpie la zona con agua oxigenada, alcohol ni yodo. Si tiene una herida, un rasguo, una dureza o una callosidad en el pie, revsela varias veces al da para asegurarse de que se est curando y no se infecte. Est atento a los siguientes signos: Enrojecimiento, [place]. Lquido o sangre. Calor. Pus o mal olor. Consejos generales No se cruce de piernas. Esto puede disminuir el flujo de sangre a los pies. No use bolsas de agua caliente ni almohadillas trmicas en los pies. Podran causar quemaduras. Si ha perdido la sensibilidad en los pies o las piernas, no sabr lo que le est sucediendo hasta que sea demasiado tarde. Proteja sus pies del calor y del fro con calzado, en la playa o sobre el pavimento caliente. Programe una cita para un examen completo de los pies por lo menos una vez al ao (anualmente) o con ms frecuencia si tiene [employer]. Informe todos los cortes, llagas o moretones a su mdico inmediatamente. Dnde buscar ms informacin American Diabetes Association (Asociacin Estadounidense de la Diabetes): www.diabetes.org Association  of Diabetes Care & Education Specialists (Asociacin de Especialistas en Atencin y Educacin sobre la Diabetes): www.diabeteseducator.org Comunquese con un mdico si: Tiene una afeccin que aumenta su riesgo de tener infecciones y tiene cortes, llagas o moretones en los pies. Tiene una lesin que no se [place]. Tiene una zona irritada en las piernas o los pies. Siente una sensacin de  ardor u hormigueo en las piernas o los pies. Siente dolor o calambres en las piernas o los pies. Las piernas o los pies estn adormecidos. Siente los pies siempre fros. Siente dolor alrededor de [place] ua del pie. Solicite ayuda de inmediato si: Tiene una herida, un rasguo, una dureza o una callosidad en el pie y: Scientist, clinical (histocompatibility and immunogenetics), hinchazn o enrojecimiento que empeora. Le sale lquido o sangre de la herida, el rasguo, la dureza o la callosidad. La herida, el rasguo, la dureza o la callosidad est caliente al tacto. Le sale pus o mal olor de la herida, el rasguo, la dureza o la callosidad. Tiene fiebre. Tiene una lnea roja que sube por la pierna. Resumen Examine a diario sus pies en busca de cortes, moretones, enrojecimiento, ampollas o llagas. Aplique una locin hidratante o vaselina en la piel de los pies y en las uas secas y [place]. Use zapatos de cuero que le queden bien y que sean acolchados. Si tiene [employer], infrmele al mdico de [employer] cortes, las llagas o los moretones. Programe una cita para un examen completo de los pies por lo menos una vez al ao (anualmente) o con ms frecuencia si tiene [employer]. Esta informacin no tiene Marine scientist el consejo del mdico. Asegrese de hacerle al mdico cualquier pregunta que tenga. Document Revised: 11/17/2019 Document Reviewed: 11/17/2019 Elsevier Patient Education  [place].

## 2021-11-23 LAB — HCV INTERPRETATION

## 2021-11-23 LAB — BASIC METABOLIC PANEL
BUN/Creatinine Ratio: 17 (ref 9–20)
BUN: 15 mg/dL (ref 6–24)
CO2: 24 mmol/L (ref 20–29)
Calcium: 10 mg/dL (ref 8.7–10.2)
Chloride: 101 mmol/L (ref 96–106)
Creatinine, Ser: 0.89 mg/dL (ref 0.76–1.27)
Glucose: 114 mg/dL — ABNORMAL HIGH (ref 70–99)
Potassium: 4.3 mmol/L (ref 3.5–5.2)
Sodium: 141 mmol/L (ref 134–144)
eGFR: 110 mL/min/{1.73_m2} (ref >59)

## 2021-11-23 LAB — HCV AB W REFLEX TO QUANT PCR: HCV Ab: NONREACTIVE

## 2021-11-23 LAB — MICROALBUMIN / CREATININE URINE RATIO
Creatinine, Urine: 147.4 mg/dL
Microalb/Creat Ratio: 27 mg/g creat (ref 0–29)
Microalbumin, Urine: 39.9 ug/mL

## 2021-11-28 ENCOUNTER — Telehealth: Payer: Self-pay

## 2021-11-28 NOTE — Telephone Encounter (Signed)
Pt given lab results using Interpreter Millie [phone] per notes of Dr. Margarita Rana on 11/28/21. Pt verbalized understanding.

## 2021-12-17 ENCOUNTER — Other Ambulatory Visit: Payer: Self-pay

## 2022-01-14 ENCOUNTER — Other Ambulatory Visit: Payer: Self-pay

## 2022-02-25 ENCOUNTER — Other Ambulatory Visit: Payer: Self-pay

## 2022-03-07 ENCOUNTER — Other Ambulatory Visit: Payer: Self-pay

## 2022-03-07 ENCOUNTER — Encounter: Payer: Self-pay | Admitting: Physician Assistant

## 2022-03-07 ENCOUNTER — Ambulatory Visit: Payer: Self-pay | Attending: Physician Assistant | Admitting: Physician Assistant

## 2022-03-07 VITALS — BP 146/76 | HR 69 | Wt 164.0 lb

## 2022-03-07 DIAGNOSIS — I10 Essential (primary) hypertension: Secondary | ICD-10-CM

## 2022-03-07 DIAGNOSIS — E1165 Type 2 diabetes mellitus with hyperglycemia: Secondary | ICD-10-CM

## 2022-03-07 LAB — POCT URINALYSIS DIP (CLINITEK)
Bilirubin, UA: NEGATIVE
Blood, UA: NEGATIVE
Glucose, UA: NEGATIVE mg/dL
Ketones, POC UA: NEGATIVE mg/dL
Leukocytes, UA: NEGATIVE
Nitrite, UA: NEGATIVE
POC PROTEIN,UA: NEGATIVE
Spec Grav, UA: 1.02 (ref 1.010–1.025)
Urobilinogen, UA: 0.2 E.U./dL
pH, UA: 5 (ref 5.0–8.0)

## 2022-03-07 LAB — POCT GLYCOSYLATED HEMOGLOBIN (HGB A1C): HbA1c, POC (controlled diabetic range): 7.7 % — AB (ref 0.0–7.0)

## 2022-03-07 LAB — GLUCOSE, POCT (MANUAL RESULT ENTRY): POC Glucose: 126 mg/dl — AB (ref 70–99)

## 2022-03-07 MED ORDER — DAPAGLIFLOZIN PROPANEDIOL 10 MG PO TABS
10.0000 mg | ORAL_TABLET | Freq: Every day | ORAL | 5 refills | Status: DC
  Filled 2022-03-07 – 2022-03-08 (×2): qty 30, 30d supply, fill #0

## 2022-03-07 MED ORDER — LISINOPRIL-HYDROCHLOROTHIAZIDE 20-25 MG PO TABS
1.0000 | ORAL_TABLET | Freq: Every day | ORAL | 1 refills | Status: DC
  Filled 2022-03-07: qty 90, 90d supply, fill #0
  Filled 2022-03-29: qty 30, 30d supply, fill #0
  Filled 2022-05-02: qty 30, 30d supply, fill #1
  Filled 2022-06-07: qty 30, 30d supply, fill #2
  Filled 2022-07-12: qty 30, 30d supply, fill #3
  Filled 2022-08-12: qty 30, 30d supply, fill #4
  Filled 2022-09-16: qty 30, 30d supply, fill #5

## 2022-03-07 NOTE — Progress Notes (Signed)
Patient ID: Billy Gregory, male   DOB: 1981-02-27, 41 y.o.   MRN: [id number]     Billy Gregory, is a 41 y.o. male  HWE:[id number]  ELF:[id number]  DOB - 30-Jul-1980  Chief Complaint  Patient presents with   Diabetes   Medication Refill       Subjective:   Billy Gregory is a 41 y.o. male here today for kidney check up.   About 1 month ago, went to an UC bc he was sick.  There was protein in his urine and they told him his "kidneys weren't good."  He did not bring any of those results with him today.  His microalbumin ratio was normal 3 months ago and his last BMP had normal kidney function.  He never picked up the jardiance bc it was too expensive.  He is compliant with glipizide 10 bid and metformin 1000 bid and his lisinopril/HCT and cholesterol meds.    A1C=7.7 today.  He is compliant with meds.   No problems updated.  ALLERGIES: No Known Allergies  PAST MEDICAL HISTORY: Past Medical History:  Diagnosis Date   Diabetes mellitus without complication ([place])    Hypertension     MEDICATIONS AT HOME: Prior to Admission medications   Medication Sig Start Date End Date Taking? Authorizing Provider  acetaminophen (TYLENOL) 325 MG tablet Take 650 mg by mouth every 6 (six) hours as needed.   Yes [provider]  atorvastatin (LIPITOR) 40 MG tablet TAKE 1/2 TABLETS BY MOUTH DAILY. 08/02/21 08/02/22 Yes Chelli Yerkes, Dionne Bucy, PA-C  Blood Glucose Monitoring Suppl (TRUE METRIX METER) w/Device KIT USE AS INSTRUCTED 02/08/19  Yes Charlott Rakes, MD  cholecalciferol (VITAMIN D) 1000 units tablet Take 5,000 Units by mouth daily.   Yes [provider]  dapagliflozin propanediol (FARXIGA) 10 MG TABS tablet Take 1 tablet (10 mg total) by mouth daily before breakfast. 03/07/22  Yes Djon Tith M, PA-C  glipiZIDE (GLUCOTROL) 10 MG tablet Take 1 tablet (10 mg total) by mouth 2 (two) times daily before a meal. 11/21/21  Yes Newlin, Enobong, MD  glucose blood  test strip TEST YOUR BLOOD SUGAR ONCE DAILY IN THE MORNING 08/02/21  Yes Ski Polich M, PA-C  metFORMIN (GLUCOPHAGE) 500 MG tablet TAKE 2 TABLETS (1,000 MG TOTAL) BY MOUTH 2 (TWO) TIMES DAILY WITH A MEAL. 08/02/21 08/02/22 Yes Tayshon Winker, Dionne Bucy, PA-C  miconazole (MICOTIN) 2 % cream Apply 1 application topically 2 (two) times daily. 05/18/20  Yes Ladell Pier, MD  TRUEplus Lancets 28G MISC USE TO CHECK BLOOD SUGARS DAILY. 08/02/21  Yes Najee Manninen M, PA-C  lisinopril-hydrochlorothiazide (ZESTORETIC) 20-25 MG tablet TAKE 1 TABLET BY MOUTH DAILY. 03/07/22 03/07/23  Argentina Donovan, PA-C    ROS: Neg HEENT Neg resp Neg cardiac Neg GI Neg GU Neg MS Neg psych Neg neuro  Objective:   Vitals:   03/07/22 1416  BP: (!) 146/76  Pulse: 69  SpO2: 100%  Weight: 164 lb (74.4 kg)   Exam General appearance : Awake, alert, not in any distress. Speech Clear. Not toxic looking HEENT: Atraumatic and Normocephalic Neck: Supple, no JVD. No cervical lymphadenopathy.  Chest: Good air entry bilaterally, CTAB.  No rales/rhonchi/wheezing CVS: S1 S2 regular, no murmurs.  Extremities: B/L Lower Ext shows no edema, both legs are warm to touch Neurology: Awake alert, and oriented X 3, CN II-XII intact, Non focal Skin: No Rash  Data Review Lab Results  Component Value Date   HGBA1C 7.7 (A) 03/07/2022   HGBA1C 6.6  11/21/2021   HGBA1C 10.6 (A) 08/02/2021    Assessment & Plan   1. Type 2 diabetes mellitus with hyperglycemia, unspecified whether long term insulin use (HCC) Check blood sugars fasting and bedtime and record and bring to next visit. Continue metformin and glipizide and add farxiga(discussed with Lurena Joiner) - Glucose (CBG) - HgB A1c - Comprehensive metabolic panel - POCT URINALYSIS DIP (CLINITEK) - dapagliflozin propanediol (FARXIGA) 10 MG TABS tablet; Take 1 tablet (10 mg total) by mouth daily before breakfast.  Dispense: 30 tablet; Refill: 5  2. HTN (hypertension), benign Check  blood pressure OOO. - lisinopril-hydrochlorothiazide (ZESTORETIC) 20-25 MG tablet; TAKE 1 TABLET BY MOUTH DAILY.  Dispense: 90 tablet; Refill: 1    Return for 1 month with Lurena Joiner for diabetes and 3 months with PCP.  The patient was given clear instructions to go to ER or return to medical center if symptoms don't improve, worsen or new problems develop. The patient verbalized understanding. The patient was told to call to get lab results if they haven't heard anything in the next week.      Freeman Caldron, PA-C [facility] and [place], [place]   03/07/2022, 3:09 PM

## 2022-03-07 NOTE — Patient Instructions (Signed)
Check blood sugars fasting and bedtime and record and bring to next visit

## 2022-03-08 ENCOUNTER — Other Ambulatory Visit: Payer: Self-pay

## 2022-03-08 LAB — COMPREHENSIVE METABOLIC PANEL
ALT: 24 IU/L (ref 0–44)
AST: 21 IU/L (ref 0–40)
Albumin/Globulin Ratio: 1.7 (ref 1.2–2.2)
Albumin: 4.7 g/dL (ref 4.1–5.1)
Alkaline Phosphatase: 56 IU/L (ref 44–121)
BUN/Creatinine Ratio: 15 (ref 9–20)
BUN: 13 mg/dL (ref 6–24)
Bilirubin Total: 0.9 mg/dL (ref 0.0–1.2)
CO2: 24 mmol/L (ref 20–29)
Calcium: 9.6 mg/dL (ref 8.7–10.2)
Chloride: 103 mmol/L (ref 96–106)
Creatinine, Ser: 0.87 mg/dL (ref 0.76–1.27)
Globulin, Total: 2.8 g/dL (ref 1.5–4.5)
Glucose: 118 mg/dL — ABNORMAL HIGH (ref 70–99)
Potassium: 4.4 mmol/L (ref 3.5–5.2)
Sodium: 141 mmol/L (ref 134–144)
Total Protein: 7.5 g/dL (ref 6.0–8.5)
eGFR: 110 mL/min/{1.73_m2} (ref >59)

## 2022-03-21 ENCOUNTER — Other Ambulatory Visit: Payer: Self-pay

## 2022-03-22 ENCOUNTER — Other Ambulatory Visit: Payer: Self-pay

## 2022-03-29 ENCOUNTER — Other Ambulatory Visit: Payer: Self-pay

## 2022-04-18 ENCOUNTER — Ambulatory Visit: Payer: Self-pay | Admitting: Pharmacist

## 2022-04-30 ENCOUNTER — Encounter: Payer: Self-pay | Admitting: Family Medicine

## 2022-04-30 ENCOUNTER — Ambulatory Visit: Payer: Self-pay | Attending: Family Medicine | Admitting: Family Medicine

## 2022-04-30 VITALS — BP 137/88 | HR 100 | Temp 98.0°F | Ht 64.0 in | Wt 162.8 lb

## 2022-04-30 DIAGNOSIS — E1165 Type 2 diabetes mellitus with hyperglycemia: Secondary | ICD-10-CM

## 2022-04-30 DIAGNOSIS — I10 Essential (primary) hypertension: Secondary | ICD-10-CM

## 2022-04-30 NOTE — Patient Instructions (Signed)
Plan de accin para la diabetes mellitus Diabetes Mellitus Action Plan Seguir un plan de accin para la diabetes es una forma de controlar sus sntomas de diabetes (diabetes mellitus). El plan se codifica con colores para ayudarlo a comprender qu acciones necesita tomar en funcin de los sntomas que est teniendo. Si tiene sntomas pertenecientes a la zona roja, necesita buscar atencin mdica inmediatamente. Si tiene sntomas pertenecientes a la zona amarilla, est teniendo problemas. Si tiene sntomas pertenecientes a la zona verde, significa que se encuentra bien. Aprender y comprender la diabetes puede llevar tiempo. Siga el plan que elabor con el mdico. Conozca el rango deseado para su nivel de azcar en la sangre (glucosa), y revise su plan de tratamiento con su mdico en cada visita. El rango deseado para mi nivel de azcar en la sangre es __________________________ mg/dl. Zona roja Obtenga ayuda de inmediato si observa cualquiera de estos sntomas: Un resultado de azcar en la sangre que est por debajo de 54 mg/dl (3 mmol/l). Un nivel de azcar en la sangre mayor o igual que 240 mg/dl (13,3 mmol/l) durante 2 das seguidos. Confusin o dificultad para pensar con claridad. Dificultad para respirar. Malestar o fiebre durante 2 o ms das que no mejora. Niveles moderados o altos de cetonas en la orina. Sentirse cansado o sin energa. Si tiene cualquiera de los sntomas pertenecientes a la zona roja, no espere para ver si desaparecen. Solicite atencin mdica de inmediato. Comunquese con el servicio de emergencias de su localidad (911 en los [place]). No conduzca por sus propios medios hasta el hospital. Si tiene un nivel de azcar en la sangre muy bajo (hipoglucemia grave) y no puede ingerir ningn alimento ni bebida, tal vez necesite glucagn. Asegrese de que un familiar o amigo sepa controlarle el nivel de azcar en la sangre y aplicarle glucagn. Puede necesitar tratamiento en  un hospital para esta afeccin. Zona amarilla Si tiene alguno de los siguientes sntomas, su diabetes no est controlada y usted necesitar hacer algunos cambios: Un nivel de azcar en la sangre mayor o igual que 240 mg/dl (13,3 mmol/l) durante 2 das seguidos. Un resultado de azcar en la sangre que est por debajo de 70 mg/dl (3,9 mmol/l). Otros sntomas de hipoglucemia, como: Temblores o sensacin de desvanecimiento. Confusin o irritabilidad. Sensacin de hambre. Latidos cardacos acelerados. Si tiene algn sntoma de la zona amarilla: Trate la hipoglucemia comiendo o bebiendo 15 gramos de hidratos de carbono de accin rpida. Siga las regla 15:15: Consuma 15 gramos de hidratos de carbono de accin rpida, como: 1 pomo de glucosa en gel. 4 comprimidos de glucosa. 4 onzas (120 ml) de jugo de frutas. 4 onzas (120 ml) de refresco comn (no diettico). Controle su nivel de azcar en la sangre 15 minutos despus de ingerir el hidrato de carbono. Si este nuevo nivel de azcar en la sangre todava es igual o menor que 70 mg/dl (3,9 mmol/l), ingiera nuevamente 15 gramos de un hidrato de carbono. Si el nivel de azcar en la sangre no supera los 70 mg/dl (3,9 mmol/l) despus de 3 intentos, solicite ayuda mdica de inmediato. Ingiera una comida o un refrigerio en el transcurso de 1 hora despus de que el nivel de azcar en la sangre se haya normalizado. Siga tomando los medicamentos diarios como se lo haya indicado el mdico. Controle su nivel de azcar en la sangre con ms frecuencia que lo hara normalmente. Anote los resultados. Llame al mdico si tiene problemas para mantener el nivel de azcar   en la sangre dentro del rango deseado.  Zona verde Estos signos significan que se encuentra bien y puede continuar haciendo lo que est haciendo para controlar la diabetes: Su nivel de azcar en la sangre est en el rango deseado. En la mayora de las personas, el nivel de azcar en la sangre antes de  una comida (preprandial) debera ser de 80 a 130 mg/dl (de 4.4 a 7.2 mmol/l). Se siente bien, y pueda volver a realizar las actividades diarias. Si se encuentra en la zona verde, contine controlando su diabetes como se lo haya indicado el mdico. Para hacer esto: Siga una dieta saludable. Haga ejercicio regularmente. Controle su nivel de azcar en la sangre como se lo haya indicado el mdico. Tome los medicamentos como se lo haya indicado el mdico.  Dnde buscar ms informacin American Diabetes Association (ADA) (Asociacin Estadounidense de la Diabetes): diabetes.org Association of Diabetes Care & Education Specialists (ADCES) (Asociacin de Especialistas en Atencin y Educacin sobre la Diabetes): diabeteseducator.org Resumen Seguir un plan de accin para la diabetes, es una forma de controlar sus sntomas de diabetes. El plan se codifica con colores para ayudarlo a comprender qu acciones necesita tomar en funcin de los sntomas que est teniendo. Siga el plan que elabor con el mdico. Asegrese de conocer su nivel deseado de azcar en la sangre. Revise el plan de tratamiento con el mdico en todas las consultas. Esta informacin no tiene como fin reemplazar el consejo del mdico. Asegrese de hacerle al mdico cualquier pregunta que tenga. Document Revised: 10/21/2019 Document Reviewed: 10/21/2019 Elsevier Patient Education  2023 Elsevier Inc.  

## 2022-04-30 NOTE — Progress Notes (Signed)
Subjective:  Patient ID: Billy Gregory, male    DOB: Sep 25, 1980  Age: 42 y.o. MRN: [id number]  CC: Diabetes   HPI Billy Gregory is a 42 y.o. year old male with a history of type 2 diabetes mellitus (A1c 7.7), hypertension who presents today for a follow-up visit.    Interval History:  He is doing well on his current regimen of Glipizide, Metformin and Farxiga. Endorses adherence with his medications. Denies presence of hypoglycemia, visual concerns or neuropathy. Not up to date on annual eye exams. His visit for chronic disease management was scheduled for next month however he came in today because he stated at urgent care he was told something was wrong with his kidneys.  His most recent labs revealed normal creatinine.  His microalbumin creatinine ratio was normal in 10/2021. Doing well on his antihypertensives. He has no additional concerns Past Medical History:  Diagnosis Date   Diabetes mellitus without complication ([place])    Hypertension     History reviewed. No pertinent surgical history.  Family History  Problem Relation Age of Onset   Diabetes Paternal Aunt     Social History   Socioeconomic History   Marital status: Single    Spouse name: Not on file   Number of children: Not on file   Years of education: Not on file   Highest education level: Not on file  Occupational History    Employer: [employer]    Comment: Housekeeping  Tobacco Use   Smoking status: Never   Smokeless tobacco: Never  Substance and Sexual Activity   Alcohol use: No    Alcohol/week: 0.0 standard drinks of alcohol   Drug use: No   Sexual activity: Not on file  Other Topics Concern   Not on file  Social History Narrative   ** Merged History Encounter **       Employment: housekeeping     From [place].   Social Determinants of Health   Financial Resource Strain: Not on file  Food Insecurity: Not on file  Transportation Needs: Not on file  Physical Activity:  Not on file  Stress: Not on file  Social Connections: Not on file    No Known Allergies  Outpatient Medications Prior to Visit  Medication Sig Dispense Refill   acetaminophen (TYLENOL) 325 MG tablet Take 650 mg by mouth every 6 (six) hours as needed.     atorvastatin (LIPITOR) 40 MG tablet TAKE 1/2 TABLETS BY MOUTH DAILY. 30 tablet 6   Blood Glucose Monitoring Suppl (TRUE METRIX METER) w/Device KIT USE AS INSTRUCTED 1 kit 0   cholecalciferol (VITAMIN D) 1000 units tablet Take 5,000 Units by mouth daily.     dapagliflozin propanediol (FARXIGA) 10 MG TABS tablet Take 1 tablet (10 mg total) by mouth daily before breakfast. 30 tablet 5   glipiZIDE (GLUCOTROL) 10 MG tablet Take 1 tablet (10 mg total) by mouth 2 (two) times daily before a meal. 60 tablet 6   glucose blood test strip TEST YOUR BLOOD SUGAR ONCE DAILY IN THE MORNING 100 each 6   lisinopril-hydrochlorothiazide (ZESTORETIC) 20-25 MG tablet TAKE 1 TABLET BY MOUTH DAILY. 90 tablet 1   metFORMIN (GLUCOPHAGE) 500 MG tablet TAKE 2 TABLETS (1,000 MG TOTAL) BY MOUTH 2 (TWO) TIMES DAILY WITH A MEAL. 120 tablet 6   miconazole (MICOTIN) 2 % cream Apply 1 application topically 2 (two) times daily. 28.35 g 0   TRUEplus Lancets 28G MISC USE TO CHECK BLOOD SUGARS DAILY. 100 each 6  No facility-administered medications prior to visit.     ROS Review of Systems  Constitutional:  Negative for activity change and appetite change.  HENT:  Negative for sinus pressure and sore throat.   Respiratory:  Negative for chest tightness, shortness of breath and wheezing.   Cardiovascular:  Negative for chest pain and palpitations.  Gastrointestinal:  Negative for abdominal distention, abdominal pain and constipation.  Genitourinary: Negative.   Musculoskeletal: Negative.   Psychiatric/Behavioral:  Negative for behavioral problems and dysphoric mood.     Objective:  BP 137/88   Pulse 100   Temp 98 F (36.7 C) (Oral)   Ht '5\' 4"'$  (1.626 m)   Wt 162  lb 12.8 oz (73.8 kg)   SpO2 98%   BMI 27.94 kg/m      04/30/2022    4:02 PM 03/07/2022    2:16 PM 11/21/2021    1:40 PM  BP/Weight  Systolic BP 229 798 921  Diastolic BP 88 76 82  Wt. (Lbs) 162.8 164 159.4  BMI 27.94 kg/m2 28.15 kg/m2 27.36 kg/m2      Physical Exam Constitutional:      Appearance: He is well-developed.  Cardiovascular:     Rate and Rhythm: Normal rate.     Heart sounds: Normal heart sounds. No murmur heard. Pulmonary:     Effort: Pulmonary effort is normal.     Breath sounds: Normal breath sounds. No wheezing or rales.  Chest:     Chest wall: No tenderness.  Abdominal:     General: Bowel sounds are normal. There is no distension.     Palpations: Abdomen is soft. There is no mass.     Tenderness: There is no abdominal tenderness.  Musculoskeletal:        General: Normal range of motion.     Right lower leg: No edema.     Left lower leg: No edema.  Neurological:     Mental Status: He is alert and oriented to person, place, and time.  Psychiatric:        Mood and Affect: Mood normal.        Latest Ref Rng & Units 03/07/2022    3:09 PM 11/21/2021    2:16 PM 09/06/2021   11:21 AM  CMP  Glucose 70 - 99 mg/dL 118  114  126   BUN 6 - 24 mg/dL '13  15  12   '$ Creatinine 0.76 - 1.27 mg/dL 0.87  0.89  0.76   Sodium 134 - 144 mmol/L 141  141  139   Potassium 3.5 - 5.2 mmol/L 4.4  4.3  4.1   Chloride 96 - 106 mmol/L 103  101  102   CO2 20 - 29 mmol/L '24  24  22   '$ Calcium 8.7 - 10.2 mg/dL 9.6  10.0  9.4   Total Protein 6.0 - 8.5 g/dL 7.5   7.2   Total Bilirubin 0.0 - 1.2 mg/dL 0.9   1.1   Alkaline Phos 44 - 121 IU/L 56   46   AST 0 - 40 IU/L 21   14   ALT 0 - 44 IU/L 24   16     Lipid Panel     Component Value Date/Time   CHOL 110 08/02/2021 1035   TRIG 70 08/02/2021 1035   HDL 35 (L) 08/02/2021 1035   CHOLHDL 3.1 08/02/2021 1035   CHOLHDL 4.1 06/11/2016 1441   VLDL 26 06/11/2016 1441   LDLCALC 60 08/02/2021 1035  CBC    Component Value  Date/Time   WBC 8.4 08/02/2021 1035   WBC 9.2 09/19/2015 1719   RBC 5.01 08/02/2021 1035   RBC 5.08 09/19/2015 1719   HGB 14.9 08/02/2021 1035   HCT 42.7 08/02/2021 1035   PLT 213 08/02/2021 1035   MCV 85 08/02/2021 1035   MCH 29.7 08/02/2021 1035   MCH 29.3 09/19/2015 1719   MCHC 34.9 08/02/2021 1035   MCHC 35.6 09/19/2015 1719   RDW 12.5 08/02/2021 1035   LYMPHSABS 2.3 08/02/2021 1035   MONOABS 552 09/19/2015 1719   EOSABS 0.3 08/02/2021 1035   BASOSABS 0.1 08/02/2021 1035    Lab Results  Component Value Date   HGBA1C 7.7 (A) 03/07/2022    Assessment & Plan:  1. Type 2 diabetes mellitus with hyperglycemia, without long-term current use of insulin (HCC) Optimally controlled with A1c of 7.7, goal is less than 7.0 He stated that after he had an A1c of 6.6 in 10/2021 he started to indulge and had indiscretion or eating which led to his A1c of 7.7 He states that since then he has been eating right [place]ade was initiated at his visit 6 weeks ago hence I will check his renal function Counseled on Diabetic diet, my plate method, 342 minutes of moderate intensity exercise/week Blood sugar logs with fasting goals of 80-120 mg/dl, random of less than 180 and in the event of sugars less than 60 mg/dl or greater than 400 mg/dl encouraged to notify the clinic. Advised on the need for annual eye exams, annual foot exams, Pneumonia vaccine.  - CMP14+EGFR  2. HTN (hypertension), benign Controlled Continue current regimen Counseled on blood pressure goal of less than 130/80, low-sodium, DASH diet, medication compliance, 150 minutes of moderate intensity exercise per week. Discussed medication compliance, adverse effects.    No orders of the defined types were placed in this encounter.   Follow-up: Return in about 2 months (around 06/29/2022) for Chronic medical conditions; cancel existing appointment.       Charlott Rakes, MD, FAAFP. [facility] and [place], [place]   04/30/2022, 4:29 PM

## 2022-05-01 LAB — CMP14+EGFR
ALT: 22 IU/L (ref 0–44)
AST: 17 IU/L (ref 0–40)
Albumin/Globulin Ratio: 1.7 (ref 1.2–2.2)
Albumin: 4.8 g/dL (ref 4.1–5.1)
Alkaline Phosphatase: 53 IU/L (ref 44–121)
BUN/Creatinine Ratio: 21 — ABNORMAL HIGH (ref 9–20)
BUN: 19 mg/dL (ref 6–24)
Bilirubin Total: 0.7 mg/dL (ref 0.0–1.2)
CO2: 24 mmol/L (ref 20–29)
Calcium: 9.8 mg/dL (ref 8.7–10.2)
Chloride: 97 mmol/L (ref 96–106)
Creatinine, Ser: 0.92 mg/dL (ref 0.76–1.27)
Globulin, Total: 2.9 g/dL (ref 1.5–4.5)
Glucose: 199 mg/dL — ABNORMAL HIGH (ref 70–99)
Potassium: 3.5 mmol/L (ref 3.5–5.2)
Sodium: 138 mmol/L (ref 134–144)
Total Protein: 7.7 g/dL (ref 6.0–8.5)
eGFR: 107 mL/min/{1.73_m2} (ref >59)

## 2022-05-02 ENCOUNTER — Other Ambulatory Visit: Payer: Self-pay

## 2022-05-09 ENCOUNTER — Ambulatory Visit: Payer: Self-pay | Admitting: Pharmacist

## 2022-05-17 ENCOUNTER — Other Ambulatory Visit: Payer: Self-pay

## 2022-06-07 ENCOUNTER — Other Ambulatory Visit: Payer: Self-pay

## 2022-06-10 ENCOUNTER — Ambulatory Visit: Payer: Self-pay | Admitting: Family Medicine

## 2022-07-01 ENCOUNTER — Encounter: Payer: Self-pay | Admitting: Family Medicine

## 2022-07-01 ENCOUNTER — Ambulatory Visit: Payer: Self-pay | Attending: Family Medicine | Admitting: Family Medicine

## 2022-07-01 VITALS — BP 125/79 | HR 86 | Wt 164.8 lb

## 2022-07-01 DIAGNOSIS — E1165 Type 2 diabetes mellitus with hyperglycemia: Secondary | ICD-10-CM

## 2022-07-01 DIAGNOSIS — I10 Essential (primary) hypertension: Secondary | ICD-10-CM

## 2022-07-01 LAB — GLUCOSE, POCT (MANUAL RESULT ENTRY): POC Glucose: 93 mg/dl (ref 70–99)

## 2022-07-01 LAB — POCT GLYCOSYLATED HEMOGLOBIN (HGB A1C): HbA1c, POC (controlled diabetic range): 7.6 % — AB (ref 0.0–7.0)

## 2022-07-01 NOTE — Progress Notes (Signed)
Subjective:  Patient ID: Billy Gregory, male    DOB: 1980-07-07  Age: 42 y.o. MRN: [id number]  CC: Diabetes   HPI Billy Gregory is a 41 y.o. year old male with a history of type 2 diabetes mellitus (A1c 7.6), hypertension who presents today for a follow-up visit.   Interval History:  A1c is 7.6 and he endorses adherence with his medications but has been eating out a lot and is promising to do better. He is not up to date on eye exam as he has no medical coverage and cannot afford to see one.  He has no hypoglycemia, no visual concerns, no neuropathy. He exercises once in a while by going walking with his daughter. Endorses adherence with his antihypertensive. Denies presence of additional concerns. Past Medical History:  Diagnosis Date   Diabetes mellitus without complication    Hypertension     No past surgical history on file.  Family History  Problem Relation Age of Onset   Diabetes Paternal Aunt     Social History   Socioeconomic History   Marital status: Single    Spouse name: Not on file   Number of children: Not on file   Years of education: Not on file   Highest education level: Not on file  Occupational History    Employer: [employer]    Comment: Housekeeping  Tobacco Use   Smoking status: Never   Smokeless tobacco: Never  Substance and Sexual Activity   Alcohol use: No    Alcohol/week: 0.0 standard drinks of alcohol   Drug use: No   Sexual activity: Not on file  Other Topics Concern   Not on file  Social History Narrative   ** Merged History Encounter **       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   Social Determinants of Health   Financial Resource Strain: Not on file  Food Insecurity: Not on file  Transportation Needs: Not on file  Physical Activity: Not on file  Stress: Not on file  Social Connections: Not on file    No Known Allergies  Outpatient Medications Prior to Visit  Medication Sig Dispense Refill    acetaminophen (TYLENOL) 325 MG tablet Take 650 mg by mouth every 6 (six) hours as needed.     atorvastatin (LIPITOR) 40 MG tablet TAKE 1/2 TABLETS BY MOUTH DAILY. 30 tablet 6   cholecalciferol (VITAMIN D) 1000 units tablet Take 5,000 Units by mouth daily.     dapagliflozin propanediol (FARXIGA) 10 MG TABS tablet Take 1 tablet (10 mg total) by mouth daily before breakfast. 30 tablet 5   glipiZIDE (GLUCOTROL) 10 MG tablet Take 1 tablet (10 mg total) by mouth 2 (two) times daily before a meal. 60 tablet 6   glucose blood test strip TEST YOUR BLOOD SUGAR ONCE DAILY IN THE MORNING 100 each 6   lisinopril-hydrochlorothiazide (ZESTORETIC) 20-25 MG tablet TAKE 1 TABLET BY MOUTH DAILY. 90 tablet 1   metFORMIN (GLUCOPHAGE) 500 MG tablet TAKE 2 TABLETS (1,000 MG TOTAL) BY MOUTH 2 (TWO) TIMES DAILY WITH A MEAL. 120 tablet 6   miconazole (MICOTIN) 2 % cream Apply 1 application topically 2 (two) times daily. 28.35 g 0   TRUEplus Lancets 28G MISC USE TO CHECK BLOOD SUGARS DAILY. 100 each 6   Blood Glucose Monitoring Suppl (TRUE METRIX METER) w/Device KIT USE AS INSTRUCTED 1 kit 0   No facility-administered medications prior to visit.     ROS Review of Systems  Constitutional:  Negative for activity change and appetite change.  HENT:  Negative for sinus pressure and sore throat.   Respiratory:  Negative for chest tightness, shortness of breath and wheezing.   Cardiovascular:  Negative for chest pain and palpitations.  Gastrointestinal:  Negative for abdominal distention, abdominal pain and constipation.  Genitourinary: Negative.   Musculoskeletal: Negative.   Psychiatric/Behavioral:  Negative for behavioral problems and dysphoric mood.     Objective:  BP 125/79 (BP Location: Left Arm, Patient Position: Sitting, Cuff Size: Normal)   Pulse 86   Wt 164 lb 12.8 oz (74.8 kg)   SpO2 98%   BMI 28.29 kg/m      07/01/2022    2:40 PM 04/30/2022    4:02 PM 03/07/2022    2:16 PM  BP/Weight  Systolic BP 125  [id number]  Diastolic BP 79 88 76  Wt. (Lbs) 164.8 162.8 164  BMI 28.29 kg/m2 27.94 kg/m2 28.15 kg/m2      Physical Exam Constitutional:      Appearance: He is well-developed.  Cardiovascular:     Rate and Rhythm: Normal rate.     Heart sounds: Normal heart sounds. No murmur heard. Pulmonary:     Effort: Pulmonary effort is normal.     Breath sounds: Normal breath sounds. No wheezing or rales.  Chest:     Chest wall: No tenderness.  Abdominal:     General: Bowel sounds are normal. There is no distension.     Palpations: Abdomen is soft. There is no mass.     Tenderness: There is no abdominal tenderness.  Musculoskeletal:        General: Normal range of motion.     Right lower leg: No edema.     Left lower leg: No edema.  Neurological:     Mental Status: He is alert and oriented to person, place, and time.  Psychiatric:        Mood and Affect: Mood normal.        Latest Ref Rng & Units 04/30/2022    4:29 PM 03/07/2022    3:09 PM 11/21/2021    2:16 PM  CMP  Glucose 70 - 99 mg/dL 096199  045118  [id number]   BUN 6 - 24 mg/dL 19  13  15    Creatinine 0.76 - 1.27 mg/dL 8.110.92  9.140.87  7.820.89   Sodium 134 - 144 mmol/L 138  141  141   Potassium 3.5 - 5.2 mmol/L 3.5  4.4  4.3   Chloride 96 - 106 mmol/L 97  103  101   CO2 20 - 29 mmol/L 24  24  24    Calcium 8.7 - 10.2 mg/dL 9.8  9.6  95.610.0   Total Protein 6.0 - 8.5 g/dL 7.7  7.5    Total Bilirubin 0.0 - 1.2 mg/dL 0.7  0.9    Alkaline Phos 44 - 121 IU/L 53  56    AST 0 - 40 IU/L 17  21    ALT 0 - 44 IU/L 22  24      Lipid Panel     Component Value Date/Time   CHOL 110 08/02/2021 1035   TRIG 70 08/02/2021 1035   HDL 35 (L) 08/02/2021 1035   CHOLHDL 3.1 08/02/2021 1035   CHOLHDL 4.1 06/11/2016 1441   VLDL 26 06/11/2016 1441   LDLCALC 60 08/02/2021 1035    CBC    Component Value Date/Time   WBC 8.4 08/02/2021 1035   WBC 9.2 09/19/2015 1719  RBC 5.01 08/02/2021 1035   RBC 5.08 09/19/2015 1719   HGB 14.9 08/02/2021 1035   HCT 42.7  08/02/2021 1035   PLT 213 08/02/2021 1035   MCV 85 08/02/2021 1035   MCH 29.7 08/02/2021 1035   MCH 29.3 09/19/2015 1719   MCHC 34.9 08/02/2021 1035   MCHC 35.6 09/19/2015 1719   RDW 12.5 08/02/2021 1035   LYMPHSABS 2.3 08/02/2021 1035   MONOABS 552 09/19/2015 1719   EOSABS 0.3 08/02/2021 1035   BASOSABS 0.1 08/02/2021 1035    Lab Results  Component Value Date   HGBA1C 7.6 (A) 07/01/2022    Assessment & Plan:  1. Type 2 diabetes mellitus with hyperglycemia, without long-term current use of insulin Slightly above goal with A1c of 7.6 No regimen change today but he will be working on his lifestyle Continue Farxiga, metformin, glipizide Continue statin Counseled on Diabetic diet, my plate method, 657 minutes of moderate intensity exercise/week Blood sugar logs with fasting goals of 80-120 mg/dl, random of less than 846 and in the event of sugars less than 60 mg/dl or greater than 962 mg/dl encouraged to notify the clinic. Advised on the need for annual eye exams, annual foot exams, Pneumonia vaccine. - POCT glucose (manual entry) - POCT glycosylated hemoglobin (Hb A1C)  2. HTN (hypertension), benign Controlled Continue lisinopril/HCTZ Counseled on blood pressure goal of less than 130/80, low-sodium, DASH diet, medication compliance, 150 minutes of moderate intensity exercise per week. Discussed medication compliance, adverse effects.    No orders of the defined types were placed in this encounter.   Follow-up: Return in about 6 months (around 12/31/2022) for Chronic medical conditions.       Hoy Register, MD, FAAFP. [facility] [place], [place] [phone]   07/01/2022, 3:01 PM

## 2022-07-01 NOTE — Patient Instructions (Signed)
Diabetes mellitus y nutricin, en adultos Diabetes Mellitus and Nutrition, Adult Si sufre de diabetes, o diabetes mellitus, es muy importante tener hbitos alimenticios saludables debido a que sus niveles de azcar en la sangre (glucosa) se ven afectados en gran medida por lo que come y bebe. Comer alimentos saludables en las cantidades correctas, aproximadamente a la misma hora todos los das, lo ayudar a: Controlar su glucemia. Disminuir el riesgo de sufrir una enfermedad cardaca. Mejorar la presin arterial. Alcanzar o mantener un peso saludable. Qu puede afectar mi plan de alimentacin? Todas las personas que sufren de diabetes son diferentes y cada una tiene necesidades diferentes en cuanto a un plan de alimentacin. El mdico puede recomendarle que trabaje con un nutricionista para elaborar el mejor plan para usted. Su plan de alimentacin puede variar segn factores como: Las caloras que necesita. Los medicamentos que toma. Su peso. Sus niveles de glucemia, presin arterial y colesterol. Su nivel de actividad. Otras afecciones que tenga, como enfermedades cardacas o renales. Cmo me afectan los carbohidratos? Los carbohidratos, o hidratos de carbono, afectan su nivel de glucemia ms que cualquier otro tipo de alimento. La ingesta de carbohidratos aumenta la cantidad de glucosa en la sangre. Es importante conocer la cantidad de carbohidratos que se pueden ingerir en cada comida sin correr ningn riesgo. Esto es diferente en cada persona. Su nutricionista puede ayudarlo a calcular la cantidad de carbohidratos que debe ingerir en cada comida y en cada refrigerio. Cmo me afecta el alcohol? El alcohol puede provocar una disminucin de la glucemia (hipoglucemia), especialmente si usa insulina o toma determinados medicamentos por va oral para la diabetes. La hipoglucemia es una afeccin potencialmente mortal. Los sntomas de la hipoglucemia, como somnolencia, mareos y confusin, son  similares a los sntomas de haber consumido demasiado alcohol. No beba alcohol si: Su mdico le indica no hacerlo. Est embarazada, puede estar embarazada o est tratando de quedar embarazada. Si bebe alcohol: Limite la cantidad que bebe a lo siguiente: De 0 a 1 medida por da para las mujeres. De 0 a 2 medidas por da para los hombres. Sepa cunta cantidad de alcohol hay en las bebidas que toma. En los [place], una medida equivale a una botella de cerveza de 12 oz (355 ml), un vaso de vino de 5 oz (148 ml) o un vaso de una bebida alcohlica de alta graduacin de 1 oz (44 ml). Mantngase hidratado bebiendo agua, refrescos dietticos o t helado sin azcar. Tenga en cuenta que los refrescos comunes, los jugos y otras bebidas para mezclar pueden contener mucha azcar y se deben contar como carbohidratos. Consejos para seguir este plan  Leer las etiquetas de los alimentos Comience por leer el tamao de la porcin en la etiqueta de Informacin nutricional de los alimentos envasados y las bebidas. La cantidad de caloras, carbohidratos, grasas y otros nutrientes detallados en la etiqueta se basan en una porcin del alimento. Muchos alimentos contienen ms de una porcin por envase. Verifique la cantidad total de gramos (g) de carbohidratos totales en una porcin. Verifique la cantidad de gramos de grasas saturadas y grasas trans en una porcin. Escoja alimentos que no contengan estas grasas o que su contenido de estas sea bajo. Verifique la cantidad de miligramos (mg) de sal (sodio) en una porcin. La mayora de las personas deben limitar la ingesta de sodio total a menos de 2300 mg por da. Siempre consulte la informacin nutricional de los alimentos etiquetados como "con bajo contenido de grasa" o "sin grasa".   Estos alimentos pueden tener un mayor contenido de azcar agregada o carbohidratos refinados, y deben evitarse. Hable con su nutricionista para identificar sus objetivos diarios en  cuanto a los nutrientes mencionados en la etiqueta. Al ir de compras Evite comprar alimentos procesados, enlatados o precocidos. Estos alimentos tienden a tener una mayor cantidad de grasa, sodio y azcar agregada. Compre en la zona exterior de la tienda de comestibles. Esta es la zona donde se encuentran con mayor frecuencia las frutas y las verduras frescas, los cereales a granel, las carnes frescas y los productos lcteos frescos. Al cocinar Use mtodos de coccin a baja temperatura, como hornear, en lugar de mtodos de coccin a alta temperatura, como frer en abundante aceite. Cocine con aceites saludables, como el aceite de oliva, canola o girasol. Evite cocinar con manteca, crema o carnes con alto contenido de grasa. Planificacin de las comidas Coma las comidas y los refrigerios regularmente, preferentemente a la misma hora todos los das. Evite pasar largos perodos de tiempo sin comer. Consuma alimentos ricos en fibra, como frutas frescas, verduras, frijoles y cereales integrales. Consuma entre 4 y 6 onzas (entre 112 y 168 g) de protenas magras por da, como carnes magras, pollo, pescado, huevos o tofu. Una onza (oz) (28 g) de protena magra equivale a: 1 onza (28 g) de carne, pollo o pescado. 1 huevo.  taza (62 g) de tofu. Coma algunos alimentos por da que contengan grasas saludables, como aguacates, frutos secos, semillas y pescado. Qu alimentos debo comer? Frutas Bayas. Manzanas. Naranjas. Duraznos. Damascos. Ciruelas. Uvas. Mangos. Papayas. Granadas. Kiwi. Cerezas. Verduras Verduras de hoja verde, que incluyen lechuga, espinaca, col rizada, acelga, hojas de berza, hojas de mostaza y repollo. Remolachas. Coliflor. Brcoli. Zanahorias. Judas verdes. Tomates. Pimientos. Cebollas. Pepinos. Coles de Bruselas. Granos Granos integrales, como panes, galletas, tortillas, cereales y pastas de salvado o integrales. Avena sin azcar. Quinua. Arroz integral o salvaje. Carnes y otras  protenas Frutos de mar. Carne de ave sin piel. Cortes magros de ave y carne de res. Tofu. Frutos secos. Semillas. Lcteos Productos lcteos sin grasa o con bajo contenido de grasa, como leche, yogur y queso. Es posible que los productos detallados arriba no constituyan una lista completa de los alimentos y las bebidas que puede tomar. Consulte a un nutricionista para obtener ms informacin. Qu alimentos debo evitar? Frutas Frutas enlatadas al almbar. Verduras Verduras enlatadas. Verduras congeladas con mantequilla o salsa de crema. Granos Productos elaborados con harina y harina blanca refinada, como panes, pastas, bocadillos y cereales. Evite todos los alimentos procesados. Carnes y otras protenas Cortes de carne con alto contenido de grasa. Carne de ave con piel. Carnes empanizadas o fritas. Carne procesada. Evite las grasas saturadas. Lcteos Yogur, queso o leche enteros. Bebidas Bebidas azucaradas, como gaseosas o t helado. Es posible que los productos que se enumeran ms arriba no constituyan una lista completa de los alimentos y las bebidas que debe evitar. Consulte a un nutricionista para obtener ms informacin. Preguntas para hacerle al mdico Debo consultar con un especialista certificado en atencin y educacin sobre la diabetes? Es necesario que me rena con un nutricionista? A qu nmero puedo llamar si tengo preguntas? Cules son los mejores momentos para controlar la glucemia? Dnde encontrar ms informacin: American Diabetes Association (Asociacin Estadounidense de la Diabetes): diabetes.org Academy of Nutrition and Dietetics (Academia de Nutricin y Diettica): eatright.org National Institute of Diabetes and Digestive and Kidney Diseases (Instituto Nacional de la Diabetes y las Enfermedades Digestivas y Renales): niddk.nih.gov Association of Diabetes   Care & Education Specialists (Asociacin de Especialistas en Atencin y Educacin sobre la Diabetes):  diabeteseducator.org Resumen Es importante tener hbitos alimenticios saludables debido a que sus niveles de azcar en la sangre (glucosa) se ven afectados en gran medida por lo que come y bebe. Es importante consumir alcohol con prudencia. Un plan de comidas saludable lo ayudar a controlar la glucosa en sangre y a reducir el riesgo de enfermedades cardacas. El mdico puede recomendarle que trabaje con un nutricionista para elaborar el mejor plan para usted. Esta informacin no tiene como fin reemplazar el consejo del mdico. Asegrese de hacerle al mdico cualquier pregunta que tenga. Document Revised: 11/17/2019 Document Reviewed: 11/17/2019 Elsevier Patient Education  2023 Elsevier Inc.  

## 2022-07-12 ENCOUNTER — Other Ambulatory Visit: Payer: Self-pay

## 2022-08-12 ENCOUNTER — Other Ambulatory Visit: Payer: Self-pay

## 2022-08-13 ENCOUNTER — Other Ambulatory Visit: Payer: Self-pay

## 2022-08-13 ENCOUNTER — Other Ambulatory Visit: Payer: Self-pay | Admitting: Physician Assistant

## 2022-08-13 ENCOUNTER — Other Ambulatory Visit: Payer: Self-pay | Admitting: Family Medicine

## 2022-08-13 DIAGNOSIS — E1165 Type 2 diabetes mellitus with hyperglycemia: Secondary | ICD-10-CM

## 2022-08-13 DIAGNOSIS — E119 Type 2 diabetes mellitus without complications: Secondary | ICD-10-CM

## 2022-08-13 DIAGNOSIS — E781 Pure hyperglyceridemia: Secondary | ICD-10-CM

## 2022-08-13 DIAGNOSIS — E785 Hyperlipidemia, unspecified: Secondary | ICD-10-CM

## 2022-08-13 MED ORDER — ATORVASTATIN CALCIUM 40 MG PO TABS
20.0000 mg | ORAL_TABLET | Freq: Every day | ORAL | 1 refills | Status: DC
  Filled 2022-08-13 – 2022-08-22 (×2): qty 45, 90d supply, fill #0
  Filled 2022-11-29 (×2): qty 45, 90d supply, fill #1

## 2022-08-13 MED ORDER — TRUE METRIX BLOOD GLUCOSE TEST VI STRP
ORAL_STRIP | 2 refills | Status: DC
  Filled 2022-08-13 – 2022-09-16 (×2): qty 100, 90d supply, fill #0

## 2022-08-13 MED ORDER — METFORMIN HCL 500 MG PO TABS
ORAL_TABLET | Freq: Two times a day (BID) | ORAL | 1 refills | Status: DC
  Filled 2022-08-13 – 2022-08-22 (×2): qty 360, 90d supply, fill #0
  Filled 2022-11-29 (×2): qty 360, 90d supply, fill #1

## 2022-08-13 MED ORDER — GLIPIZIDE 10 MG PO TABS
10.0000 mg | ORAL_TABLET | Freq: Two times a day (BID) | ORAL | 1 refills | Status: DC
  Filled 2022-08-13 – 2022-08-22 (×2): qty 180, 90d supply, fill #0
  Filled 2022-11-29 (×2): qty 180, 90d supply, fill #1

## 2022-08-14 ENCOUNTER — Other Ambulatory Visit: Payer: Self-pay

## 2022-08-21 ENCOUNTER — Other Ambulatory Visit: Payer: Self-pay

## 2022-08-22 ENCOUNTER — Other Ambulatory Visit: Payer: Self-pay

## 2022-09-16 ENCOUNTER — Other Ambulatory Visit: Payer: Self-pay

## 2022-11-04 ENCOUNTER — Other Ambulatory Visit: Payer: Self-pay | Admitting: Physician Assistant

## 2022-11-04 DIAGNOSIS — I10 Essential (primary) hypertension: Secondary | ICD-10-CM

## 2022-11-05 ENCOUNTER — Other Ambulatory Visit: Payer: Self-pay

## 2022-11-05 MED ORDER — LISINOPRIL-HYDROCHLOROTHIAZIDE 20-25 MG PO TABS
1.0000 | ORAL_TABLET | Freq: Every day | ORAL | 1 refills | Status: DC
  Filled 2022-11-05: qty 90, 90d supply, fill #0
  Filled 2023-02-17 (×2): qty 90, 90d supply, fill #1

## 2022-11-06 ENCOUNTER — Other Ambulatory Visit: Payer: Self-pay

## 2022-11-29 ENCOUNTER — Other Ambulatory Visit: Payer: Self-pay

## 2022-11-29 ENCOUNTER — Other Ambulatory Visit (HOSPITAL_COMMUNITY): Payer: Self-pay

## 2022-12-23 ENCOUNTER — Other Ambulatory Visit: Payer: Self-pay

## 2022-12-30 ENCOUNTER — Other Ambulatory Visit: Payer: Self-pay

## 2023-01-01 ENCOUNTER — Encounter: Payer: Self-pay | Admitting: Family Medicine

## 2023-01-01 ENCOUNTER — Ambulatory Visit: Payer: Self-pay | Attending: Family Medicine | Admitting: Family Medicine

## 2023-01-01 VITALS — BP 142/89 | HR 66 | Ht 64.0 in | Wt 167.0 lb

## 2023-01-01 DIAGNOSIS — I10 Essential (primary) hypertension: Secondary | ICD-10-CM

## 2023-01-01 DIAGNOSIS — Z7984 Long term (current) use of oral hypoglycemic drugs: Secondary | ICD-10-CM

## 2023-01-01 DIAGNOSIS — Z23 Encounter for immunization: Secondary | ICD-10-CM

## 2023-01-01 DIAGNOSIS — L84 Corns and callosities: Secondary | ICD-10-CM

## 2023-01-01 DIAGNOSIS — E119 Type 2 diabetes mellitus without complications: Secondary | ICD-10-CM

## 2023-01-01 LAB — POCT GLYCOSYLATED HEMOGLOBIN (HGB A1C): HbA1c, POC (controlled diabetic range): 7.7 % — AB (ref 0.0–7.0)

## 2023-01-01 NOTE — Patient Instructions (Signed)
La diabetes mellitus y el cuidado de los pies Diabetes Mellitus and Foot Care La diabetes, tambin llamada diabetes mellitus, puede causar problemas en los pies y las piernas debido a un flujo sanguneo (circulacin) deficiente. La mala circulacin puede hacer que la piel: Se torne ms fina y seca. Se resquebraje ms fcilmente. Cicatrice ms lentamente. Se descame y agriete. Tambin puede presentar tener dao nervioso (neuropata). Esto puede causar una disminucin de la sensibilidad en las piernas y los pies. En consecuencia, es posible que no advierta heridas pequeas en los pies que pueden causar problemas ms graves. Identificar problemas y tratarlos lo antes posible es la mejor manera de evitar futuros problemas en los pies. Cmo cuidar los pies Higiene de los pies  Lvese los pies todos los das con agua tibia y un jabn suave. No use agua caliente. Luego squese los pies y entre los dedos dando palmaditas hasta que estn completamente secos. No ponga los pies en remojo. Esto puede secarle la piel. Crtese las uas de los pies en lnea recta. No escarbe debajo de las uas o alrededor de las cutculas. Lime los bordes de las uas con una lima o esmeril. Aplique una locin hidratante o vaselina en la piel de los pies y en las uas secas y quebradizas. Use una locin que no contenga alcohol ni fragancias. No aplique locin entre los dedos. Zapatos y calcetines Use calcetines de algodn o medias limpias todos los das. Asegrese de que no le ajusten demasiado. No use medias hasta la rodilla. Estas pueden reducir el flujo de sangre a las piernas. Use zapatos que le queden bien y que sean acolchados. Revise siempre los zapatos antes de ponrselos para asegurarse de que no haya objetos en su interior. Para amoldar los zapatos, clcelos solo algunas horas por da. Esto evitar lesiones en los pies. Heridas, rasguos, durezas y callosidades  Controle sus pies diariamente para observar si hay  ampollas, cortes, moretones, llagas o enrojecimiento. Si no puede ver la planta del pie, use un espejo o pdale ayuda a otra persona. No corte las durezas o callosidades, ni trate de quitarlas con medicamentos. Si algo le ha raspado, cortado o lastimado la piel de los pies, mantenga la piel de esa zona limpia y seca. Puede higienizar estas zonas con agua y un jabn suave. No limpie la zona con agua oxigenada, alcohol ni yodo. Si tiene una herida, un rasguo, una dureza o una callosidad en el pie, revsela varias veces al da para asegurarse de que se est curando y no se infecte. Est atento a los siguientes signos: Enrojecimiento, hinchazn o dolor. Lquido o sangre. Calor. Pus o mal olor. Consejos generales No se cruce de piernas. Esto puede disminuir el flujo de sangre a los pies. No use bolsas de agua caliente ni almohadillas trmicas en los pies. Podran causar quemaduras. Si ha perdido la sensibilidad en los pies o las piernas, no sabr lo que le est sucediendo hasta que sea demasiado tarde. Proteja sus pies del calor y del fro con calzado, en la playa o sobre el pavimento caliente. Programe una cita para un examen completo de los pies por lo menos una vez al ao o con ms frecuencia si tiene problemas en los pies. Informe todos los cortes, llagas o moretones a su mdico de inmediato. Dnde obtener ms informacin American Diabetes Association (Asociacin Estadounidense de la Diabetes): diabetes.org Association of Diabetes Care & Education Specialists (Asociacin de Especialistas en Atencin y Educacin sobre la Diabetes): diabeteseducator.org Comunquese con un   mdico si: Tiene una afeccin que aumenta su riesgo de tener infecciones y tiene cortes, llagas o moretones en los pies. Tiene una lesin que no se cura. Tiene una zona irritada en las piernas o los pies. Siente una sensacin de ardor u hormigueo en las piernas o los pies. Siente dolor o calambres en las piernas o los pies. Las  piernas o los pies estn adormecidos. Siente los pies siempre fros. Siente dolor alrededor de alguna ua del pie. Solicite ayuda de inmediato si: Tiene una herida, un rasguo, una dureza o una callosidad en el pie y: Tiene signos de infeccin. Tiene fiebre. Tiene una lnea roja que sube por la pierna. Esta informacin no tiene como fin reemplazar el consejo del mdico. Asegrese de hacerle al mdico cualquier pregunta que tenga. Document Revised: 10/02/2021 Document Reviewed: 10/02/2021 Elsevier Patient Education  2024 Elsevier Inc.  

## 2023-01-01 NOTE — Progress Notes (Signed)
Subjective:  Patient ID: Billy Gregory, male    DOB: 1981-01-12  Age: 42 y.o. MRN: [id number]  CC: Medical Management of Chronic Issues   HPI Billy Gregory is a 42 y.o. year old male with a history of type 2 diabetes mellitus (A1c 7.7), hypertension who presents today for a follow-up visit.   Interval History: Discussed the use of AI scribe software for clinical note transcription with the patient, who gave verbal consent to proceed.  He presents for a routine follow-up. He has been checking his blood sugar at home, but not daily. The readings have been around 126-135. He is currently on Farxiga, glipizide, and metformin for diabetes management. He expresses a commitment to work on improving his A1c, which is currently 7.7, slightly above the target of 7.  He is adherent with his antihypertensive his blood pressure is slightly above goal. He has no additional concerns today.       Past Medical History:  Diagnosis Date   Diabetes mellitus without complication (HCC)    Hypertension     No past surgical history on file.  Family History  Problem Relation Age of Onset   Diabetes Paternal Aunt     Social History   Socioeconomic History   Marital status: Single    Spouse name: Not on file   Number of children: Not on file   Years of education: Not on file   Highest education level: Not on file  Occupational History    Employer: [employer]    Comment: Housekeeping  Tobacco Use   Smoking status: Never   Smokeless tobacco: Never  Substance and Sexual Activity   Alcohol use: No    Alcohol/week: 0.0 standard drinks of alcohol   Drug use: No   Sexual activity: Not on file  Other Topics Concern   Not on file  Social History Narrative   ** Merged History Encounter **       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   Social Determinants of Health   Financial Resource Strain: Not on file  Food Insecurity: Not on file  Transportation Needs: Not on  file  Physical Activity: Not on file  Stress: Not on file  Social Connections: Not on file    No Known Allergies  Outpatient Medications Prior to Visit  Medication Sig Dispense Refill   acetaminophen (TYLENOL) 325 MG tablet Take 650 mg by mouth every 6 (six) hours as needed.     atorvastatin (LIPITOR) 40 MG tablet Take 0.5 tablets (20 mg total) by mouth daily. 45 tablet 1   Blood Glucose Monitoring Suppl (TRUE METRIX METER) w/Device KIT USE AS INSTRUCTED 1 kit 0   cholecalciferol (VITAMIN D) 1000 units tablet Take 5,000 Units by mouth daily.     dapagliflozin propanediol (FARXIGA) 10 MG TABS tablet Take 1 tablet (10 mg total) by mouth daily before breakfast. 30 tablet 5   glipiZIDE (GLUCOTROL) 10 MG tablet Take 1 tablet (10 mg total) by mouth 2 (two) times daily before a meal. 180 tablet 1   glucose blood (TRUE METRIX BLOOD GLUCOSE TEST) test strip TEST YOUR BLOOD SUGAR ONCE DAILY IN THE MORNING 100 each 2   lisinopril-hydrochlorothiazide (ZESTORETIC) 20-25 MG tablet Take 1 tablet by mouth daily. 90 tablet 1   metFORMIN (GLUCOPHAGE) 500 MG tablet TAKE 2 TABLETS (1,000 MG TOTAL) BY MOUTH 2 (TWO) TIMES DAILY WITH A MEAL. 360 tablet 1   miconazole (MICOTIN) 2 % cream Apply 1 application topically 2 (two)  times daily. 28.35 g 0   TRUEplus Lancets 28G MISC USE TO CHECK BLOOD SUGARS DAILY. 100 each 6   No facility-administered medications prior to visit.     ROS Review of Systems  Constitutional:  Negative for activity change and appetite change.  HENT:  Negative for sinus pressure and sore throat.   Respiratory:  Negative for chest tightness, shortness of breath and wheezing.   Cardiovascular:  Negative for chest pain and palpitations.  Gastrointestinal:  Negative for abdominal distention, abdominal pain and constipation.  Genitourinary: Negative.   Musculoskeletal: Negative.   Psychiatric/Behavioral:  Negative for behavioral problems and dysphoric mood.     Objective:  BP (!)  142/89   Pulse 66   Ht 5\' 4"  (1.626 m)   Wt 167 lb (75.8 kg)   SpO2 100%   BMI 28.67 kg/m      01/01/2023    9:01 AM 01/01/2023    8:46 AM 07/01/2022    2:40 PM  BP/Weight  Systolic BP 142 150 125  Diastolic BP 89 81 79  Wt. (Lbs)  167 164.8  BMI  28.67 kg/m2 28.29 kg/m2      Physical Exam Constitutional:      Appearance: He is well-developed.  Cardiovascular:     Rate and Rhythm: Normal rate.     Heart sounds: Normal heart sounds. No murmur heard. Pulmonary:     Effort: Pulmonary effort is normal.     Breath sounds: Normal breath sounds. No wheezing or rales.  Chest:     Chest wall: No tenderness.  Abdominal:     General: Bowel sounds are normal. There is no distension.     Palpations: Abdomen is soft. There is no mass.     Tenderness: There is no abdominal tenderness.  Musculoskeletal:        General: Normal range of motion.     Right lower leg: No edema.     Left lower leg: No edema.  Neurological:     Mental Status: He is alert and oriented to person, place, and time.  Psychiatric:        Mood and Affect: Mood normal.    Diabetic Foot Exam - Simple   Simple Foot Form Diabetic Foot exam was performed with the following findings: Yes 01/01/2023 10:32 AM  Visual Inspection See comments: Yes Sensation Testing Intact to touch and monofilament testing bilaterally: Yes Pulse Check Posterior Tibialis and Dorsalis pulse intact bilaterally: Yes Comments Callus on medial aspect of right great toe        Latest Ref Rng & Units 04/30/2022    4:29 PM 03/07/2022    3:09 PM 11/21/2021    2:16 PM  CMP  Glucose 70 - 99 mg/dL 409  811  914   BUN 6 - 24 mg/dL 19  13  15    Creatinine 0.76 - 1.27 mg/dL 7.82  9.56  2.13   Sodium 134 - 144 mmol/L 138  141  141   Potassium 3.5 - 5.2 mmol/L 3.5  4.4  4.3   Chloride 96 - 106 mmol/L 97  103  101   CO2 20 - 29 mmol/L 24  24  24    Calcium 8.7 - 10.2 mg/dL 9.8  9.6  08.6   Total Protein 6.0 - 8.5 g/dL 7.7  7.5    Total  Bilirubin 0.0 - 1.2 mg/dL 0.7  0.9    Alkaline Phos 44 - 121 IU/L 53  56    AST 0 - 40  IU/L 17  21    ALT 0 - 44 IU/L 22  24      Lipid Panel     Component Value Date/Time   CHOL 110 08/02/2021 1035   TRIG 70 08/02/2021 1035   HDL 35 (L) 08/02/2021 1035   CHOLHDL 3.1 08/02/2021 1035   CHOLHDL 4.1 06/11/2016 1441   VLDL 26 06/11/2016 1441   LDLCALC 60 08/02/2021 1035    CBC    Component Value Date/Time   WBC 8.4 08/02/2021 1035   WBC 9.2 09/19/2015 1719   RBC 5.01 08/02/2021 1035   RBC 5.08 09/19/2015 1719   HGB 14.9 08/02/2021 1035   HCT 42.7 08/02/2021 1035   PLT 213 08/02/2021 1035   MCV 85 08/02/2021 1035   MCH 29.7 08/02/2021 1035   MCH 29.3 09/19/2015 1719   MCHC 34.9 08/02/2021 1035   MCHC 35.6 09/19/2015 1719   RDW 12.5 08/02/2021 1035   LYMPHSABS 2.3 08/02/2021 1035   MONOABS 552 09/19/2015 1719   EOSABS 0.3 08/02/2021 1035   BASOSABS 0.1 08/02/2021 1035    Lab Results  Component Value Date   HGBA1C 7.7 (A) 01/01/2023    Assessment & Plan:      Hypertension Elevated blood pressure readings today despite reported adherence to Lisinopril-Hydrochlorothiazide. Discussed the importance of a low sodium diet in managing hypertension. -Recheck blood pressure in three months. -Counseled on blood pressure goal of less than 130/80, low-sodium, DASH diet, medication compliance, 150 minutes of moderate intensity exercise per week. Discussed medication compliance, adverse effects.   Type 2 Diabetes Mellitus Home blood glucose readings of 126-135 mg/dL. A1c slightly elevated at 7.7 (last 7.6). Currently on Farxiga, Glipizide, and Metformin. -Would love to add an additional medication however he would like to work on this first. -Encouraged patient to work on lowering A1c closer to 7. -Order urine and blood tests to assess kidney function.  Foot Callus Noted during physical examination. -Referral to podiatrist for management.  General Health  Maintenance -Administer influenza vaccine today.          No orders of the defined types were placed in this encounter.   Follow-up: Return in about 3 months (around 04/03/2023) for Chronic medical conditions.       Hoy Register, MD, FAAFP. [facility] [place], [place] [phone]   01/01/2023, 10:32 AM

## 2023-01-02 LAB — MICROALBUMIN / CREATININE URINE RATIO
Creatinine, Urine: 49.4 mg/dL
Microalb/Creat Ratio: 53 mg/g{creat} — ABNORMAL HIGH (ref 0–29)
Microalbumin, Urine: 26.3 ug/mL

## 2023-01-02 LAB — BASIC METABOLIC PANEL
BUN/Creatinine Ratio: 18 (ref 9–20)
BUN: 13 mg/dL (ref 6–24)
CO2: 22 mmol/L (ref 20–29)
Calcium: 9.2 mg/dL (ref 8.7–10.2)
Chloride: 103 mmol/L (ref 96–106)
Creatinine, Ser: 0.72 mg/dL — ABNORMAL LOW (ref 0.76–1.27)
Glucose: 143 mg/dL — ABNORMAL HIGH (ref 70–99)
Potassium: 4.3 mmol/L (ref 3.5–5.2)
Sodium: 138 mmol/L (ref 134–144)
eGFR: 116 mL/min/{1.73_m2} (ref >59)

## 2023-01-06 ENCOUNTER — Other Ambulatory Visit: Payer: Self-pay

## 2023-01-17 ENCOUNTER — Encounter: Payer: Self-pay | Admitting: Podiatry

## 2023-01-17 ENCOUNTER — Ambulatory Visit (INDEPENDENT_AMBULATORY_CARE_PROVIDER_SITE_OTHER): Payer: Self-pay | Admitting: Podiatry

## 2023-01-17 DIAGNOSIS — Q666 Other congenital valgus deformities of feet: Secondary | ICD-10-CM

## 2023-01-17 NOTE — Progress Notes (Signed)
Subjective:  Patient ID: Billy Gregory, male    DOB: 1980/06/15,  MRN: [id number]  Chief Complaint  Patient presents with   Callouses    RM#6 right foot callus comes and goes    42 y.o. male presents with the above complaint.  Patient presents with complaint of bilateral flatfoot deformity.  Patient wanted to discuss treatment options for it he has not seen MRIs prior to seeing me.  He gets a little bit of callus to his bilateral hallux.  He is a diabetic.  He denies any other acute complaints he does not wear any orthotics.  She wears regular shoes.   Review of Systems: Negative except as noted in the HPI. Denies N/V/F/Ch.  Past Medical History:  Diagnosis Date   Diabetes mellitus without complication (HCC)    Hypertension     Current Outpatient Medications:    acetaminophen (TYLENOL) 325 MG tablet, Take 650 mg by mouth every 6 (six) hours as needed., Disp: , Rfl:    atorvastatin (LIPITOR) 40 MG tablet, Take 0.5 tablets (20 mg total) by mouth daily., Disp: 45 tablet, Rfl: 1   Blood Glucose Monitoring Suppl (TRUE METRIX METER) w/Device KIT, USE AS INSTRUCTED, Disp: 1 kit, Rfl: 0   cholecalciferol (VITAMIN D) 1000 units tablet, Take 5,000 Units by mouth daily., Disp: , Rfl:    dapagliflozin propanediol (FARXIGA) 10 MG TABS tablet, Take 1 tablet (10 mg total) by mouth daily before breakfast., Disp: 30 tablet, Rfl: 5   glipiZIDE (GLUCOTROL) 10 MG tablet, Take 1 tablet (10 mg total) by mouth 2 (two) times daily before a meal., Disp: 180 tablet, Rfl: 1   glucose blood (TRUE METRIX BLOOD GLUCOSE TEST) test strip, TEST YOUR BLOOD SUGAR ONCE DAILY IN THE MORNING, Disp: 100 each, Rfl: 2   lisinopril-hydrochlorothiazide (ZESTORETIC) 20-25 MG tablet, Take 1 tablet by mouth daily., Disp: 90 tablet, Rfl: 1   metFORMIN (GLUCOPHAGE) 500 MG tablet, TAKE 2 TABLETS (1,000 MG TOTAL) BY MOUTH 2 (TWO) TIMES DAILY WITH A MEAL., Disp: 360 tablet, Rfl: 1   miconazole (MICOTIN) 2 % cream, Apply 1  application topically 2 (two) times daily., Disp: 28.35 g, Rfl: 0   TRUEplus Lancets 28G MISC, USE TO CHECK BLOOD SUGARS DAILY., Disp: 100 each, Rfl: 6  Social History   Tobacco Use  Smoking Status Never  Smokeless Tobacco Never    No Known Allergies Objective:  There were no vitals filed for this visit. There is no height or weight on file to calculate BMI. Constitutional Well developed. Well nourished.  Vascular Dorsalis pedis pulses palpable bilaterally. Posterior tibial pulses palpable bilaterally. Capillary refill normal to all digits.  No cyanosis or clubbing noted. Pedal hair growth normal.  Neurologic Normal speech. Oriented to person, place, and time. Epicritic sensation to light touch grossly present bilaterally.  Dermatologic Nails well groomed and normal in appearance. No open wounds. No skin lesions.  Orthopedic: Bilateral gait examination shows pes planovalgus structure with calcaneovalgus to many toe signs partially able to recruit the arch with dorsiflexion of the hallux.  Unable to perform single and double heel raise   Radiographs: None Assessment:   1. Pes planovalgus    Plan:  Patient was evaluated and treated and all questions answered.  Pes planovalgus -I explained to patient the etiology of pes planovalgus and relationship with Planter fasciitis and various treatment options were discussed.  Given patient foot structure in the setting of Planter fasciitis I believe patient will benefit from custom-made orthotics to help control  the hindfoot motion support the arch of the foot and take the stress away from plantar fascial.  Patient agrees with the plan like to proceed with orthotics -Patient was casted for orthotics    No follow-ups on file.

## 2023-01-31 NOTE — Progress Notes (Unsigned)
 Orthotic order placed  [employer]

## 2023-02-03 ENCOUNTER — Other Ambulatory Visit: Payer: Self-pay

## 2023-02-17 ENCOUNTER — Other Ambulatory Visit: Payer: Self-pay | Admitting: Family Medicine

## 2023-02-17 ENCOUNTER — Other Ambulatory Visit: Payer: Self-pay

## 2023-02-17 DIAGNOSIS — E785 Hyperlipidemia, unspecified: Secondary | ICD-10-CM

## 2023-02-17 DIAGNOSIS — E781 Pure hyperglyceridemia: Secondary | ICD-10-CM

## 2023-02-17 DIAGNOSIS — E1165 Type 2 diabetes mellitus with hyperglycemia: Secondary | ICD-10-CM

## 2023-02-17 MED ORDER — ATORVASTATIN CALCIUM 40 MG PO TABS
20.0000 mg | ORAL_TABLET | Freq: Every day | ORAL | 1 refills | Status: DC
  Filled 2023-02-17 – 2023-03-28 (×3): qty 45, 90d supply, fill #0
  Filled 2023-06-05 – 2023-07-01 (×3): qty 45, 90d supply, fill #1

## 2023-02-17 NOTE — Telephone Encounter (Signed)
Requested medications are due for refill today.  yes  Requested medications are on the active medications list.  yes  Last refill. 08/13/2022 #45 1 rf  Future visit scheduled.   yes  Notes to clinic.  Expired labs.    Requested Prescriptions  Pending Prescriptions Disp Refills   atorvastatin (LIPITOR) 40 MG tablet 45 tablet 1    Sig: Take 0.5 tablets (20 mg total) by mouth daily.     Cardiovascular:  Antilipid - Statins Failed - 02/17/2023  6:51 AM      Failed - Lipid Panel in normal range within the last 12 months    Cholesterol, Total  Date Value Ref Range Status  08/02/2021 110 100 - 199 mg/dL Final   LDL Chol Calc (NIH)  Date Value Ref Range Status  08/02/2021 60 0 - 99 mg/dL Final   HDL  Date Value Ref Range Status  08/02/2021 35 (L) >39 mg/dL Final   Triglycerides  Date Value Ref Range Status  08/02/2021 70 0 - 149 mg/dL Final         Passed - Patient is not pregnant      Passed - Valid encounter within last 12 months    Recent Outpatient Visits           1 month ago Type 2 diabetes mellitus without complication, without long-term current use of insulin (HCC)   [facility] [place]. [facility] Hoy Register, MD   7 months ago Type 2 diabetes mellitus with hyperglycemia, without long-term current use of insulin (HCC)   [facility] Of [place]. [facility] Hoy Register, MD   9 months ago Type 2 diabetes mellitus with hyperglycemia, without long-term current use of insulin (HCC)   [facility] Of [place]. [facility] Hoy Register, MD   11 months ago Type 2 diabetes mellitus with hyperglycemia, unspecified whether long term insulin use (HCC)   [facility] Of [place]. [facility] [place], [place], [place]   1 year ago Type 2 diabetes mellitus with hyperglycemia, unspecified whether long term insulin  use (HCC)   [facility] Of [place]. [facility] Hoy Register, MD       Future Appointments             In 1 month Hoy Register, MD [facility] [place] - A Dept Of . [facility]

## 2023-02-18 ENCOUNTER — Other Ambulatory Visit: Payer: Self-pay

## 2023-03-13 ENCOUNTER — Other Ambulatory Visit: Payer: Self-pay

## 2023-03-20 ENCOUNTER — Other Ambulatory Visit: Payer: Self-pay

## 2023-03-24 ENCOUNTER — Other Ambulatory Visit: Payer: Self-pay

## 2023-03-28 ENCOUNTER — Other Ambulatory Visit: Payer: Self-pay | Admitting: Family Medicine

## 2023-03-28 ENCOUNTER — Other Ambulatory Visit: Payer: Self-pay

## 2023-03-28 DIAGNOSIS — E1165 Type 2 diabetes mellitus with hyperglycemia: Secondary | ICD-10-CM

## 2023-03-28 MED ORDER — GLIPIZIDE 10 MG PO TABS
10.0000 mg | ORAL_TABLET | Freq: Two times a day (BID) | ORAL | 1 refills | Status: DC
  Filled 2023-03-28: qty 180, 90d supply, fill #0

## 2023-04-03 ENCOUNTER — Other Ambulatory Visit: Payer: Self-pay

## 2023-04-03 ENCOUNTER — Ambulatory Visit: Payer: Self-pay | Attending: Family Medicine | Admitting: Family Medicine

## 2023-04-03 ENCOUNTER — Encounter: Payer: Self-pay | Admitting: Family Medicine

## 2023-04-03 VITALS — BP 153/84 | HR 80 | Ht 64.0 in | Wt 166.6 lb

## 2023-04-03 DIAGNOSIS — Z7984 Long term (current) use of oral hypoglycemic drugs: Secondary | ICD-10-CM

## 2023-04-03 DIAGNOSIS — E1169 Type 2 diabetes mellitus with other specified complication: Secondary | ICD-10-CM

## 2023-04-03 DIAGNOSIS — E1165 Type 2 diabetes mellitus with hyperglycemia: Secondary | ICD-10-CM

## 2023-04-03 DIAGNOSIS — I10 Essential (primary) hypertension: Secondary | ICD-10-CM

## 2023-04-03 LAB — POCT GLYCOSYLATED HEMOGLOBIN (HGB A1C): HbA1c, POC (controlled diabetic range): 9.7 % — AB (ref 0.0–7.0)

## 2023-04-03 MED ORDER — AMLODIPINE BESYLATE 2.5 MG PO TABS
2.5000 mg | ORAL_TABLET | Freq: Every day | ORAL | 1 refills | Status: DC
  Filled 2023-04-03: qty 90, 90d supply, fill #0
  Filled 2023-06-05 – 2023-07-01 (×3): qty 90, 90d supply, fill #1

## 2023-04-03 MED ORDER — METFORMIN HCL 500 MG PO TABS
ORAL_TABLET | Freq: Two times a day (BID) | ORAL | 1 refills | Status: DC
  Filled 2023-04-03: qty 360, 90d supply, fill #0

## 2023-04-03 MED ORDER — LISINOPRIL-HYDROCHLOROTHIAZIDE 20-25 MG PO TABS
1.0000 | ORAL_TABLET | Freq: Every day | ORAL | 1 refills | Status: DC
  Filled 2023-04-03 – 2023-06-05 (×2): qty 90, 90d supply, fill #0
  Filled 2023-09-29 (×2): qty 90, 90d supply, fill #1

## 2023-04-03 MED ORDER — GLIPIZIDE 10 MG PO TABS
20.0000 mg | ORAL_TABLET | Freq: Two times a day (BID) | ORAL | 1 refills | Status: DC
  Filled 2023-04-03 – 2023-07-01 (×3): qty 360, 90d supply, fill #0
  Filled 2023-09-29: qty 360, 90d supply, fill #1
  Filled 2023-09-29: qty 120, 30d supply, fill #1

## 2023-04-03 NOTE — Patient Instructions (Signed)
 Diabetes mellitus y nutricin, en adultos Diabetes Mellitus and Nutrition, Adult Si sufre de diabetes, o diabetes mellitus, es muy importante tener hbitos alimenticios saludables debido a que sus niveles de Psychologist, counselling sangre (glucosa) se ven afectados en gran medida por lo que come y bebe. [place] alimentos saludables en las cantidades correctas, aproximadamente a la misma hora todos los [place], [place] ayudar a: Chief Operating Officer su glucemia. Disminuir el riesgo de sufrir una enfermedad cardaca. Mejorar la presin arterial. Barista o mantener un peso saludable. Qu puede afectar mi plan de alimentacin? Todas las personas que sufren de diabetes son diferentes y cada una tiene necesidades diferentes en cuanto a un plan de alimentacin. El mdico puede recomendarle que trabaje con un nutricionista para elaborar el mejor plan para usted. Su plan de alimentacin puede variar segn factores como: Las caloras que necesita. Los medicamentos que toma. Su peso. Sus niveles de glucemia, presin arterial y colesterol. Su nivel de [place]. Otras afecciones que tenga, como enfermedades cardacas o renales. Cmo me afectan los carbohidratos? Los carbohidratos, o hidratos de carbono, afectan su nivel de glucemia ms que cualquier otro tipo de alimento. La ingesta de carbohidratos aumenta la cantidad de [employer]. Es importante conocer la cantidad de carbohidratos que se pueden ingerir en cada comida sin correr Surveyor, minerals. Esto es Government social research officer. Su nutricionista puede ayudarlo a calcular la cantidad de carbohidratos que debe ingerir en cada comida y en cada refrigerio. Cmo me afecta el alcohol? El alcohol puede provocar una disminucin de la glucemia (hipoglucemia), especialmente si [place] insulina o toma determinados medicamentos por va oral para la diabetes. La hipoglucemia es una afeccin potencialmente mortal. Los sntomas de la hipoglucemia, como somnolencia, mareos y confusin, son  similares a los sntomas de haber consumido demasiado alcohol. No beba alcohol si: Su mdico le indica no hacerlo. Est embarazada, puede estar embarazada o est tratando de [place]. Si bebe alcohol: Limite la cantidad que bebe a lo siguiente: De 0 a 1 medida por da para las mujeres. De 0 a 2 medidas por da para los hombres. Sepa cunta cantidad de alcohol hay en las bebidas que toma. En los [address], una medida equivale a una botella de cerveza de 12 oz (355 ml), un vaso de vino de 5 oz (148 ml) o un vaso de una bebida alcohlica de alta graduacin de 1 oz (44 ml). Mantngase hidratado bebiendo agua, refrescos dietticos o t helado sin azcar. Tenga en cuenta que los refrescos comunes, los jugos y otras bebidas para mezclar pueden contener Product/process development scientist y se deben contar como carbohidratos. Consejos para seguir Social worker las etiquetas de los alimentos Comience por leer el tamao de la porcin en la etiqueta de Informacin nutricional de los alimentos envasados y las bebidas. La cantidad de caloras, carbohidratos, grasas y otros nutrientes detallados en la etiqueta se basan en una porcin del alimento. Muchos alimentos contienen ms de una porcin por envase. Verifique la cantidad total de gramos (g) de carbohidratos totales en una porcin. Verifique la cantidad de gramos de grasas saturadas y grasas trans en una porcin. Escoja alimentos que no contengan estas grasas o que su contenido de estas sea [place]. Verifique la cantidad de miligramos (mg) de sal (sodio) en una porcin. La [employer]son de las personas deben limitar la ingesta de sodio total a menos de 2300 mg Google. Siempre consulte la informacin nutricional de los alimentos etiquetados como "con bajo contenido de grasa" o "sin grasa".  Estos alimentos pueden tener un mayor contenido de International aid/development worker agregada o carbohidratos refinados, y deben evitarse. Hable con su nutricionista para identificar sus objetivos diarios en  cuanto a los nutrientes mencionados en la etiqueta. Al ir de compras Evite comprar alimentos procesados, enlatados o precocidos. Estos alimentos tienden a Counselling psychologist mayor cantidad de [place], sodio y azcar agregada. Compre en la zona exterior de la tienda de comestibles. Esta es la zona donde se encuentran con mayor frecuencia las frutas y las verduras frescas, los cereales a granel, las carnes frescas y los productos lcteos frescos. Al cocinar Use mtodos de coccin a baja temperatura, como hornear, en lugar de mtodos de coccin a alta temperatura, como frer en abundante aceite. Cocine con aceites saludables, como el aceite de [place], canola o [place]. Evite cocinar con manteca, crema o carnes con alto contenido de grasa. Planificacin de las comidas Coma las comidas y los refrigerios regularmente, preferentemente a la misma hora todos [place]. Evite pasar largos perodos de tiempo sin comer. Consuma alimentos ricos en fibra, como frutas frescas, verduras, frijoles y cereales integrales. Consuma entre 4 y 6 onzas (entre 112 y 168 g) de protenas magras por da, como carnes [place], pollo, pescado, huevos o tofu. Una onza (oz) (28 g) de protena magra equivale a: 1 onza (28 g) de carne, pollo o pescado. 1 huevo.  taza (62 g) de tofu. Coma algunos alimentos por da que contengan grasas saludables, como aguacates, frutos secos, semillas y pescado. Qu alimentos debo comer? Nils Pyle Bayas. Manzanas. Naranjas. Duraznos. Damascos. Ciruelas. Uvas. Mangos. Papayas. Granadas. Kiwi. Cerezas. Verduras Verduras de Marriott, que incluyen [place], [place], col rizada, acelga, hojas de berza, hojas de mostaza y repollo. Remolachas. Coliflor. Brcoli. Zanahorias. Judas verdes. Tomates. Pimientos. Cebollas. Pepinos. Coles de Bruselas. Granos Granos integrales, como panes, galletas, tortillas, cereales y pastas de salvado o integrales. Avena sin azcar. Quinua. Arroz integral o salvaje. Carnes y otras  protenas Frutos de mar. Carne de ave sin piel. Cortes magros de ave y carne de res. Tofu. Frutos secos. Semillas. Lcteos Productos lcteos sin grasa o con bajo contenido de [place], [place], yogur y [place]. Es posible que los productos detallados arriba no constituyan una lista completa de los alimentos y las bebidas que puede tomar. Consulte a un nutricionista para obtener ms informacin. Qu alimentos debo evitar? Nils Pyle Frutas enlatadas al almbar. Verduras Verduras enlatadas. Verduras congeladas con mantequilla o salsa de crema. Granos Productos elaborados con [place] y [place], como panes, pastas, bocadillos y cereales. Evite todos los alimentos procesados. Carnes y [address] de carne con alto contenido de Holiday representative. Carne de ave con piel. Carnes empanizadas o fritas. Carne procesada. Evite las grasas saturadas. Lcteos Yogur, queso o [employer]. Bebidas Bebidas azucaradas, como gaseosas o t helado. Es posible que los productos que se enumeran ms [place] no constituyan una lista completa de los alimentos y las bebidas que Personnel officer. Consulte a un nutricionista para obtener ms informacin. Preguntas para hacerle al mdico Debo consultar con un especialista certificado en atencin y educacin sobre la diabetes? Es necesario que me rena con un nutricionista? A qu nmero puedo llamar si tengo preguntas? Cules son los mejores momentos para controlar la glucemia? Dnde encontrar ms informacin: American Diabetes Association (Asociacin Estadounidense de la Diabetes): diabetes.org Academy of Nutrition and Dietetics (Academia de Nutricin y Pension scheme manager): eatright.Dana Corporation of Diabetes and Digestive and Kidney Diseases [employer]ere & Company de la Diabetes y las Enfermedades Digestivas y Renales): StageSync.si Association of Diabetes  Care & Education Specialists (Asociacin de Especialistas en Atencin y Francella Solian la Diabetes):  diabeteseducator.org Resumen Es importante tener hbitos alimenticios saludables debido a que sus niveles de Psychologist, counselling sangre (glucosa) se ven afectados en gran medida por lo que come y bebe. Es importante consumir alcohol con prudencia. Un plan de comidas saludable lo ayudar a controlar la glucosa en sangre y a reducir el riesgo de enfermedades cardacas. El mdico puede recomendarle que trabaje con un nutricionista para elaborar el mejor plan para usted. Esta informacin no tiene Theme park manager el consejo del mdico. Asegrese de hacerle al mdico cualquier pregunta que tenga. Document Revised: 11/17/2019 Document Reviewed: 11/17/2019 Elsevier Patient Education  2024 [employer].

## 2023-04-03 NOTE — Progress Notes (Signed)
 Subjective:  Patient ID: Billy Gregory, male    DOB: 1980/10/06  Age: 43 y.o. MRN: [id number]  CC: Medical Management of Chronic Issues   HPI Billy Gregory is a 43 y.o. year old male with a history of type 2 diabetes mellitus (A1c 9.7), hypertension who presents today for a follow-up visit.   Interval History: Discussed the use of AI scribe software for clinical note transcription with the patient, who gave verbal consent to proceed.  He presents for a routine follow-up. He reports adherence to his antihypertensive medication, but his blood pressure is slightly elevated. He also admits to forgetting his diabetes medications frequently, which has resulted in poor glycemic control.  Once he is 9.7 up from 7.7 previously.  He is currently taking metformin , glipizide , and Farxiga  for diabetes management. He expresses concern about the cost of an eye exam and plans to schedule one when work slows down due to the cold weather. He also expresses concern about increased hunger when his blood sugar levels drop due to increased medication.  He endorses a tenderness with his statin.       Past Medical History:  Diagnosis Date   Diabetes mellitus without complication (HCC)    Hypertension     No past surgical history on file.  Family History  Problem Relation Age of Onset   Diabetes Paternal Aunt     Social History   Socioeconomic History   Marital status: Single    Spouse name: Not on file   Number of children: Not on file   Years of education: Not on file   Highest education level: Not on file  Occupational History    Employer: [employer]    Comment: Housekeeping  Tobacco Use   Smoking status: Never   Smokeless tobacco: Never  Substance and Sexual Activity   Alcohol use: No    Alcohol/week: 0.0 standard drinks of alcohol   Drug use: No   Sexual activity: Not on file  Other Topics Concern   Not on file  Social History Narrative   ** Merged History  Encounter **       Employment: housekeeping     From [place].   Social Drivers of Health   Financial Resource Strain: Not on file  Food Insecurity: Food Insecurity Present (04/03/2023)   Hunger Vital Sign    Worried About Running Out of Food in the Last Year: Sometimes true    Ran Out of Food in the Last Year: Sometimes true  Transportation Needs: No Transportation Needs (04/03/2023)   PRAPARE - Administrator, Civil Service (Medical): No    Lack of Transportation (Non-Medical): No  Physical Activity: Patient Declined (04/03/2023)   Exercise Vital Sign    Days of Exercise per Week: Patient declined    Minutes of Exercise per Session: Patient declined  Stress: Not on file  Social Connections: Socially Isolated (04/03/2023)   Social Connection and Isolation Panel [NHANES]    Frequency of Communication with Friends and Family: Once a week    Frequency of Social Gatherings with Friends and Family: Once a week    Attends Religious Services: Never    Database Administrator or Organizations: No    Attends Banker Meetings: Never    Marital Status: Married    No Known Allergies  Outpatient Medications Prior to Visit  Medication Sig Dispense Refill   acetaminophen  (TYLENOL ) 325 MG tablet Take 650 mg by mouth every 6 (six) hours as needed.  atorvastatin  (LIPITOR) 40 MG tablet Take 0.5 tablets (20 mg total) by mouth daily. 45 tablet 1   Blood Glucose Monitoring Suppl (TRUE METRIX METER) w/Device KIT USE AS INSTRUCTED 1 kit 0   cholecalciferol (VITAMIN D ) 1000 units tablet Take 5,000 Units by mouth daily.     dapagliflozin  propanediol (FARXIGA ) 10 MG TABS tablet Take 1 tablet (10 mg total) by mouth daily before breakfast. 30 tablet 5   glucose blood (TRUE METRIX BLOOD GLUCOSE TEST) test strip TEST YOUR BLOOD SUGAR ONCE DAILY IN THE MORNING 100 each 2   miconazole  (MICOTIN) 2 % cream Apply 1 application topically 2 (two) times daily. 28.35 g 0   TRUEplus Lancets 28G  MISC USE TO CHECK BLOOD SUGARS DAILY. 100 each 6   glipiZIDE  (GLUCOTROL ) 10 MG tablet Take 1 tablet (10 mg total) by mouth 2 (two) times daily before a meal. 180 tablet 1   lisinopril -hydrochlorothiazide  (ZESTORETIC ) 20-25 MG tablet Take 1 tablet by mouth daily. 90 tablet 1   metFORMIN  (GLUCOPHAGE ) 500 MG tablet TAKE 2 TABLETS (1,000 MG TOTAL) BY MOUTH 2 (TWO) TIMES DAILY WITH A MEAL. 360 tablet 1   No facility-administered medications prior to visit.     ROS Review of Systems  Constitutional:  Negative for activity change and appetite change.  HENT:  Negative for sinus pressure and sore throat.   Respiratory:  Negative for chest tightness, shortness of breath and wheezing.   Cardiovascular:  Negative for chest pain and palpitations.  Gastrointestinal:  Negative for abdominal distention, abdominal pain and constipation.  Genitourinary: Negative.   Musculoskeletal: Negative.   Psychiatric/Behavioral:  Negative for behavioral problems and dysphoric mood.     Objective:  BP (!) 153/84   Pulse 80   Ht 5' 4 (1.626 m)   Wt 166 lb 9.6 oz (75.6 kg)   SpO2 100%   BMI 28.60 kg/m      04/03/2023   10:47 AM 04/03/2023   10:23 AM 01/01/2023    9:01 AM  BP/Weight  Systolic BP 153 143 142  Diastolic BP 84 83 89  Wt. (Lbs)  166.6   BMI  28.6 kg/m2       Physical Exam Constitutional:      Appearance: He is well-developed.  Cardiovascular:     Rate and Rhythm: Normal rate.     Heart sounds: Normal heart sounds. No murmur heard. Pulmonary:     Effort: Pulmonary effort is normal.     Breath sounds: Normal breath sounds. No wheezing or rales.  Chest:     Chest wall: No tenderness.  Abdominal:     General: Bowel sounds are normal. There is no distension.     Palpations: Abdomen is soft. There is no mass.     Tenderness: There is no abdominal tenderness.  Musculoskeletal:        General: Normal range of motion.     Right lower leg: No edema.     Left lower leg: No edema.   Neurological:     Mental Status: He is alert and oriented to person, place, and time.  Psychiatric:        Mood and Affect: Mood normal.        Latest Ref Rng & Units 01/01/2023    9:26 AM 04/30/2022    4:29 PM 03/07/2022    3:09 PM  CMP  Glucose 70 - 99 mg/dL 856  800  881   BUN 6 - 24 mg/dL 13  19  13  Creatinine 0.76 - 1.27 mg/dL 9.27  9.07  9.12   Sodium 134 - 144 mmol/L 138  138  141   Potassium 3.5 - 5.2 mmol/L 4.3  3.5  4.4   Chloride 96 - 106 mmol/L 103  97  103   CO2 20 - 29 mmol/L 22  24  24    Calcium  8.7 - 10.2 mg/dL 9.2  9.8  9.6   Total Protein 6.0 - 8.5 g/dL  7.7  7.5   Total Bilirubin 0.0 - 1.2 mg/dL  0.7  0.9   Alkaline Phos 44 - 121 IU/L  53  56   AST 0 - 40 IU/L  17  21   ALT 0 - 44 IU/L  22  24     Lipid Panel     Component Value Date/Time   CHOL 110 08/02/2021 1035   TRIG 70 08/02/2021 1035   HDL 35 (L) 08/02/2021 1035   CHOLHDL 3.1 08/02/2021 1035   CHOLHDL 4.1 06/11/2016 1441   VLDL 26 06/11/2016 1441   LDLCALC 60 08/02/2021 1035    CBC    Component Value Date/Time   WBC 8.4 08/02/2021 1035   WBC 9.2 09/19/2015 1719   RBC 5.01 08/02/2021 1035   RBC 5.08 09/19/2015 1719   HGB 14.9 08/02/2021 1035   HCT 42.7 08/02/2021 1035   PLT 213 08/02/2021 1035   MCV 85 08/02/2021 1035   MCH 29.7 08/02/2021 1035   MCH 29.3 09/19/2015 1719   MCHC 34.9 08/02/2021 1035   MCHC 35.6 09/19/2015 1719   RDW 12.5 08/02/2021 1035   LYMPHSABS 2.3 08/02/2021 1035   MONOABS 552 09/19/2015 1719   EOSABS 0.3 08/02/2021 1035   BASOSABS 0.1 08/02/2021 1035    Lab Results  Component Value Date   HGBA1C 9.7 (A) 04/03/2023    Assessment & Plan:      Type 2 Diabetes Mellitus A1c increased to 9.7 from 7.7. Patient reports inconsistent medication adherence. Currently on Metformin , Glipizide , and Farxiga . -Increase Glipizide  from 10mg  to 20mg  twice daily. -Develop a strategy to improve medication adherence, such as placing medication next to  toothbrush. -Refer to pharmacist for LIBERATE -Check A1c in 3 months.  Hypertension Blood pressure slightly elevated despite medication adherence. -Add Amlodipine  2.5mg  daily to regimen. -Advise on low sodium diet. -Recheck blood pressure -still elevated -Counseled on blood pressure goal of less than 130/80, low-sodium, DASH diet, medication compliance, 150 minutes of moderate intensity exercise per week. Discussed medication compliance, adverse effects.   Eye Health Patient has not had a recent eye exam. -Encourage patient to schedule an eye exam.          Meds ordered this encounter  Medications   lisinopril -hydrochlorothiazide  (ZESTORETIC ) 20-25 MG tablet    Sig: Take 1 tablet by mouth daily.    Dispense:  90 tablet    Refill:  1   metFORMIN  (GLUCOPHAGE ) 500 MG tablet    Sig: TAKE 2 TABLETS (1,000 MG TOTAL) BY MOUTH 2 (TWO) TIMES DAILY WITH A MEAL.    Dispense:  360 tablet    Refill:  1   glipiZIDE  (GLUCOTROL ) 10 MG tablet    Sig: Take 2 tablets (20 mg total) by mouth 2 (two) times daily before a meal.    Dispense:  360 tablet    Refill:  1    Dose increase   amLODipine  (NORVASC ) 2.5 MG tablet    Sig: Take 1 tablet (2.5 mg total) by mouth daily.    Dispense:  90 tablet    Refill:  1    Follow-up: Return in about 3 months (around 07/02/2023) for Chronic medical conditions.       Corrina Sabin, MD, FAAFP. [facility] and [facility] [place], [place] [phone]   04/03/2023, 11:59 AM

## 2023-04-04 ENCOUNTER — Other Ambulatory Visit: Payer: Self-pay

## 2023-04-09 ENCOUNTER — Telehealth: Payer: Self-pay | Admitting: Pharmacist

## 2023-04-09 NOTE — Telephone Encounter (Signed)
 LIBERATE Study  Received referral for patient participation in the LIBERATE CGM Study. Contacted patient to discuss study. Was unable to speak to the patient. Left HIPAA-compliant VM with instructions to return my call.   Of note, confirmed that patient meets study criteria by having a diagnosis of Type 2 Diabetes, is not currently on insulin , and most recent A1c is >8%.  Marene Shape, PharmD, Becky Bowels, CPP Clinical Pharmacist [facility] [phone]

## 2023-06-05 ENCOUNTER — Other Ambulatory Visit: Payer: Self-pay

## 2023-06-10 ENCOUNTER — Other Ambulatory Visit: Payer: Self-pay

## 2023-07-01 ENCOUNTER — Other Ambulatory Visit: Payer: Self-pay

## 2023-07-02 ENCOUNTER — Other Ambulatory Visit: Payer: Self-pay

## 2023-07-02 ENCOUNTER — Ambulatory Visit: Payer: Self-pay | Admitting: Family Medicine

## 2023-07-15 ENCOUNTER — Other Ambulatory Visit: Payer: Self-pay

## 2023-07-15 ENCOUNTER — Encounter: Payer: Self-pay | Admitting: Family Medicine

## 2023-07-15 ENCOUNTER — Ambulatory Visit: Payer: Self-pay | Attending: Family Medicine | Admitting: Family Medicine

## 2023-07-15 VITALS — BP 138/80 | HR 96 | Ht 64.0 in | Wt 163.0 lb

## 2023-07-15 DIAGNOSIS — E1169 Type 2 diabetes mellitus with other specified complication: Secondary | ICD-10-CM

## 2023-07-15 DIAGNOSIS — Z7984 Long term (current) use of oral hypoglycemic drugs: Secondary | ICD-10-CM

## 2023-07-15 DIAGNOSIS — E785 Hyperlipidemia, unspecified: Secondary | ICD-10-CM

## 2023-07-15 DIAGNOSIS — I10 Essential (primary) hypertension: Secondary | ICD-10-CM

## 2023-07-15 DIAGNOSIS — E119 Type 2 diabetes mellitus without complications: Secondary | ICD-10-CM

## 2023-07-15 DIAGNOSIS — E1165 Type 2 diabetes mellitus with hyperglycemia: Secondary | ICD-10-CM

## 2023-07-15 DIAGNOSIS — E781 Pure hyperglyceridemia: Secondary | ICD-10-CM

## 2023-07-15 LAB — POCT GLYCOSYLATED HEMOGLOBIN (HGB A1C): HbA1c, POC (controlled diabetic range): 9.8 % — AB (ref 0.0–7.0)

## 2023-07-15 MED ORDER — DAPAGLIFLOZIN PROPANEDIOL 10 MG PO TABS
10.0000 mg | ORAL_TABLET | Freq: Every day | ORAL | 1 refills | Status: DC
  Filled 2023-07-15: qty 90, 90d supply, fill #0
  Filled 2023-11-14: qty 15, 15d supply, fill #0

## 2023-07-15 MED ORDER — METFORMIN HCL 500 MG PO TABS
1000.0000 mg | ORAL_TABLET | Freq: Two times a day (BID) | ORAL | 1 refills | Status: DC
Start: 2023-07-15 — End: 2023-12-24
  Filled 2023-07-15: qty 360, 90d supply, fill #0
  Filled 2023-09-29 – 2023-11-14 (×2): qty 360, 90d supply, fill #1

## 2023-07-15 MED ORDER — ATORVASTATIN CALCIUM 20 MG PO TABS
20.0000 mg | ORAL_TABLET | Freq: Every day | ORAL | 1 refills | Status: DC
  Filled 2023-07-15: qty 90, 90d supply, fill #0
  Filled 2023-09-29 – 2023-11-14 (×3): qty 90, 90d supply, fill #1

## 2023-07-15 NOTE — Progress Notes (Signed)
 Subjective:  Patient ID: Billy Gregory, male    DOB: [birth year]  Age: 43 y.o. MRN: [id number]  CC: Medical Management of Chronic Issues     Discussed the use of AI scribe software for clinical note transcription with the patient, who gave verbal consent to proceed.  History of Present Illness The patient, with a history of Type 2 diabetes mellitus, hyperlipidemia, hypertriglyceridemia hypertension  presents for a follow-up visit. He reports no new concerns or symptoms. However, his A1c levels have remained high at 9.8, showing no improvement from the previous level of 9.7. Upon review of his medication regimen, it was discovered that the patient has been taking his glipizide  incorrectly. He was prescribed 20mg   twice daily, but has been taking only one tablet (10mg )twice daily. The patient was also found to be taking metformin  incorrectly, taking only one tablet daily instead of the prescribed two tablets twice daily of metformin  500 mg. The patient was unaware of the correct dosing instructions, leading to an inadequate intake of his diabetes medications.  He has been taking Farxiga  10 mg daily. Also endorses adherence with his statin and his antihypertensives.    Past Medical History:  Diagnosis Date   Diabetes mellitus without complication (HCC)    Hypertension     No past surgical history on file.  Family History  Problem Relation Age of Onset   Diabetes Paternal Aunt     Social History   Socioeconomic History   Marital status: Single    Spouse name: Not on file   Number of children: Not on file   Years of education: Not on file   Highest education level: Not on file  Occupational History    Employer: [employer]    Comment: Housekeeping  Tobacco Use   Smoking status: Never   Smokeless tobacco: Never  Substance and Sexual Activity   Alcohol use: No    Alcohol/week: 0.0 standard drinks of alcohol   Drug use: No   Sexual activity: Not on file  Other Topics  Concern   Not on file  Social History Narrative   ** Merged History Encounter **       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   Social Drivers of Health   Financial Resource Strain: Not on file  Food Insecurity: Food Insecurity Present (04/03/2023)   Hunger Vital Sign    Worried About Running Out of Food in the Last Year: Sometimes true    Ran Out of Food in the Last Year: Sometimes true  Transportation Needs: No Transportation Needs (04/03/2023)   PRAPARE - Administrator, Civil Service (Medical): No    Lack of Transportation (Non-Medical): No  Physical Activity: Patient Declined (04/03/2023)   Exercise Vital Sign    Days of Exercise per Week: Patient declined    Minutes of Exercise per Session: Patient declined  Stress: Not on file  Social Connections: Socially Isolated (04/03/2023)   Social Connection and Isolation Panel [NHANES]    Frequency of Communication with Friends and Family: Once a week    Frequency of Social Gatherings with Friends and Family: Once a week    Attends Religious Services: Never    Database administrator or Organizations: No    Attends Banker Meetings: Never    Marital Status: Married    No Known Allergies  Outpatient Medications Prior to Visit  Medication Sig Dispense Refill   acetaminophen  (TYLENOL ) 325 MG tablet Take 650 mg by mouth  every 6 (six) hours as needed.     amLODipine  (NORVASC ) 2.5 MG tablet Take 1 tablet (2.5 mg total) by mouth daily. 90 tablet 1   Blood Glucose Monitoring Suppl (TRUE METRIX METER) w/Device KIT USE AS INSTRUCTED 1 kit 0   cholecalciferol (VITAMIN D ) 1000 units tablet Take 5,000 Units by mouth daily.     glipiZIDE  (GLUCOTROL ) 10 MG tablet Take 2 tablets (20 mg total) by mouth 2 (two) times daily before a meal. 360 tablet 1   glucose blood (TRUE METRIX BLOOD GLUCOSE TEST) test strip TEST YOUR BLOOD SUGAR ONCE DAILY IN THE MORNING 100 each 2   lisinopril -hydrochlorothiazide  (ZESTORETIC ) 20-25 MG  tablet Take 1 tablet by mouth daily. 90 tablet 1   miconazole  (MICOTIN) 2 % cream Apply 1 application topically 2 (two) times daily. 28.35 g 0   TRUEplus Lancets 28G MISC USE TO CHECK BLOOD SUGARS DAILY. 100 each 6   atorvastatin  (LIPITOR) 40 MG tablet Take 0.5 tablets (20 mg total) by mouth daily. 45 tablet 1   dapagliflozin  propanediol (FARXIGA ) 10 MG TABS tablet Take 1 tablet (10 mg total) by mouth daily before breakfast. 30 tablet 5   metFORMIN  (GLUCOPHAGE ) 500 MG tablet TAKE 2 TABLETS (1,000 MG TOTAL) BY MOUTH 2 (TWO) TIMES DAILY WITH A MEAL. 360 tablet 1   No facility-administered medications prior to visit.     ROS Review of Systems  Constitutional:  Negative for activity change and appetite change.  HENT:  Negative for sinus pressure and sore throat.   Eyes:  Negative for visual disturbance.  Respiratory:  Negative for cough, chest tightness and shortness of breath.   Cardiovascular:  Negative for chest pain and leg swelling.  Gastrointestinal:  Negative for abdominal distention, abdominal pain, constipation and diarrhea.  Endocrine: Negative.   Genitourinary:  Negative for dysuria.  Musculoskeletal:  Negative for joint swelling and myalgias.  Skin:  Negative for rash.  Allergic/Immunologic: Negative.   Neurological:  Negative for weakness, light-headedness and numbness.  Psychiatric/Behavioral:  Negative for dysphoric mood and suicidal ideas.     Objective:  BP 138/80   Pulse 96   Ht 5\' 4"  (1.626 m)   Wt 163 lb (73.9 kg)   SpO2 97%   BMI 27.98 kg/m      07/15/2023    2:10 PM 04/03/2023   10:47 AM 04/03/2023   10:23 AM  BP/Weight  Systolic BP 138 153 143  Diastolic BP 80 84 83  Wt. (Lbs) 163  166.6  BMI 27.98 kg/m2  28.6 kg/m2      Physical Exam Constitutional:      Appearance: He is well-developed.  Cardiovascular:     Rate and Rhythm: Normal rate.     Heart sounds: Normal heart sounds. No murmur heard. Pulmonary:     Effort: Pulmonary effort is normal.      Breath sounds: Normal breath sounds. No wheezing or rales.  Chest:     Chest wall: No tenderness.  Abdominal:     General: Bowel sounds are normal. There is no distension.     Palpations: Abdomen is soft. There is no mass.     Tenderness: There is no abdominal tenderness.  Musculoskeletal:        General: Normal range of motion.     Right lower leg: No edema.     Left lower leg: No edema.  Neurological:     Mental Status: He is alert and oriented to person, place, and time.  Psychiatric:  Mood and Affect: Mood normal.        Latest Ref Rng & Units 01/01/2023    9:26 AM 04/30/2022    4:29 PM 03/07/2022    3:09 PM  CMP  Glucose 70 - 99 mg/dL 161  096  045   BUN 6 - 24 mg/dL 13  19  13    Creatinine 0.76 - 1.27 mg/dL 4.09  8.11  9.14   Sodium 134 - 144 mmol/L 138  138  141   Potassium 3.5 - 5.2 mmol/L 4.3  3.5  4.4   Chloride 96 - 106 mmol/L 103  97  103   CO2 20 - 29 mmol/L 22  24  24    Calcium  8.7 - 10.2 mg/dL 9.2  9.8  9.6   Total Protein 6.0 - 8.5 g/dL  7.7  7.5   Total Bilirubin 0.0 - 1.2 mg/dL  0.7  0.9   Alkaline Phos 44 - 121 IU/L  53  56   AST 0 - 40 IU/L  17  21   ALT 0 - 44 IU/L  22  24     Lipid Panel     Component Value Date/Time   CHOL 110 08/02/2021 1035   TRIG 70 08/02/2021 1035   HDL 35 (L) 08/02/2021 1035   CHOLHDL 3.1 08/02/2021 1035   CHOLHDL 4.1 06/11/2016 1441   VLDL 26 06/11/2016 1441   LDLCALC 60 08/02/2021 1035    CBC    Component Value Date/Time   WBC 8.4 08/02/2021 1035   WBC 9.2 09/19/2015 1719   RBC 5.01 08/02/2021 1035   RBC 5.08 09/19/2015 1719   HGB 14.9 08/02/2021 1035   HCT 42.7 08/02/2021 1035   PLT 213 08/02/2021 1035   MCV 85 08/02/2021 1035   MCH 29.7 08/02/2021 1035   MCH 29.3 09/19/2015 1719   MCHC 34.9 08/02/2021 1035   MCHC 35.6 09/19/2015 1719   RDW 12.5 08/02/2021 1035   LYMPHSABS 2.3 08/02/2021 1035   MONOABS 552 09/19/2015 1719   EOSABS 0.3 08/02/2021 1035   BASOSABS 0.1 08/02/2021 1035    Lab  Results  Component Value Date   HGBA1C 9.8 (A) 07/15/2023       Assessment & Plan Type 2 Diabetes Mellitus with poor control A1c increased to 9.8. Incorrect glipizide  and metformin  dosing identified. Educated on correct dosing and hypoglycemia symptoms. - Educated on correct glipizide  dosing: two tablets in the morning and two in the evening of his 10 mg tablets (he has been taking 1 tablet twice daily) - Educated on correct dosing of metformin  at 2 tablets in the morning and 2 tablets in the evening of his 500 mg tablets (he has been taking 1 tablet twice daily) -Continue with metformin  - Refilled all medications. - Instructed to monitor blood glucose and report hypoglycemia symptoms. - Ordered referral for annual eye exam. - Scheduled follow-up in three months.  Hyperlipidemia/hypertriglyceridemia - Controlled - Continue statin - Low-cholesterol diet  Hypertension - Controlled - Continue current regimen -Counseled on blood pressure goal of less than 130/80, low-sodium, DASH diet, medication compliance, 150 minutes of moderate intensity exercise per week. Discussed medication compliance, adverse effects.     Meds ordered this encounter  Medications   dapagliflozin  propanediol (FARXIGA ) 10 MG TABS tablet    Sig: Take 1 tablet (10 mg total) by mouth daily before breakfast.    Dispense:  90 tablet    Refill:  1   atorvastatin  (LIPITOR) 20 MG tablet  Sig: Take 1 tablet (20 mg total) by mouth daily.    Dispense:  90 tablet    Refill:  1   metFORMIN  (GLUCOPHAGE ) 500 MG tablet    Sig: Take 2 tablets (1,000 mg total) by mouth 2 (two) times daily with a meal.    Dispense:  360 tablet    Refill:  1    Follow-up: No follow-ups on file.       Joaquin Mulberry, MD, FAAFP. [facility] and [facility]ness [place], [place] [phone]   07/15/2023, 3:17 PM

## 2023-07-15 NOTE — Patient Instructions (Signed)
 VISIT SUMMARY:  You came in for a follow-up visit to discuss your diabetes management. Your A1c levels have remained high at 9.8, and it was discovered that you have been taking your diabetes medications incorrectly. We reviewed the correct dosing instructions for your medications.  YOUR PLAN:  -TYPE 2 DIABETES MELLITUS WITH POOR CONTROL: Type 2 Diabetes Mellitus is a condition where your body does not use insulin  properly, leading to high blood sugar levels. Your A1c level is currently 9.8, which indicates poor control of your diabetes. We discovered that you have been taking your glipizide  incorrectly. You should take two tablets in the morning and two in the evening. We have refilled all your medications and instructed you to monitor your blood glucose levels and report any symptoms of low blood sugar (hypoglycemia). We also ordered a referral for your annual eye exam.  INSTRUCTIONS:  Please follow the correct dosing instructions for your glipizide : two tablets in the morning and two in the evening. Monitor your blood glucose levels regularly and report any symptoms of hypoglycemia. Schedule and attend your annual eye exam. We will see you again in three months for a follow-up visit.

## 2023-07-21 ENCOUNTER — Other Ambulatory Visit: Payer: Self-pay

## 2023-09-08 ENCOUNTER — Telehealth: Payer: Self-pay | Admitting: Podiatry

## 2023-09-08 NOTE — Telephone Encounter (Signed)
 LVM to schedule appt for PUO.

## 2023-09-29 ENCOUNTER — Other Ambulatory Visit: Payer: Self-pay | Admitting: Family Medicine

## 2023-09-29 ENCOUNTER — Other Ambulatory Visit: Payer: Self-pay

## 2023-09-29 DIAGNOSIS — I10 Essential (primary) hypertension: Secondary | ICD-10-CM

## 2023-09-29 MED ORDER — AMLODIPINE BESYLATE 2.5 MG PO TABS
2.5000 mg | ORAL_TABLET | Freq: Every day | ORAL | 1 refills | Status: DC
  Filled 2023-09-29: qty 90, 90d supply, fill #0

## 2023-10-02 ENCOUNTER — Other Ambulatory Visit: Payer: Self-pay

## 2023-10-13 ENCOUNTER — Telehealth: Payer: Self-pay | Admitting: Family Medicine

## 2023-10-13 NOTE — Telephone Encounter (Signed)
 Pt confirmed appt

## 2023-10-14 ENCOUNTER — Other Ambulatory Visit: Payer: Self-pay

## 2023-10-14 ENCOUNTER — Ambulatory Visit: Payer: Self-pay | Attending: Family Medicine | Admitting: Family Medicine

## 2023-10-14 ENCOUNTER — Encounter: Payer: Self-pay | Admitting: Family Medicine

## 2023-10-14 VITALS — BP 148/78 | HR 80 | Ht 64.0 in | Wt 169.2 lb

## 2023-10-14 DIAGNOSIS — Z23 Encounter for immunization: Secondary | ICD-10-CM

## 2023-10-14 DIAGNOSIS — I10 Essential (primary) hypertension: Secondary | ICD-10-CM

## 2023-10-14 DIAGNOSIS — E1169 Type 2 diabetes mellitus with other specified complication: Secondary | ICD-10-CM

## 2023-10-14 DIAGNOSIS — Z7985 Long-term (current) use of injectable non-insulin antidiabetic drugs: Secondary | ICD-10-CM

## 2023-10-14 DIAGNOSIS — E1165 Type 2 diabetes mellitus with hyperglycemia: Secondary | ICD-10-CM

## 2023-10-14 DIAGNOSIS — Z7984 Long term (current) use of oral hypoglycemic drugs: Secondary | ICD-10-CM

## 2023-10-14 LAB — POCT GLYCOSYLATED HEMOGLOBIN (HGB A1C): HbA1c, POC (controlled diabetic range): 10.3 % — AB (ref 0.0–7.0)

## 2023-10-14 LAB — GLUCOSE, POCT (MANUAL RESULT ENTRY): POC Glucose: 227 mg/dL — AB (ref 70–99)

## 2023-10-14 MED ORDER — OZEMPIC (0.25 OR 0.5 MG/DOSE) 2 MG/3ML ~~LOC~~ SOPN
0.2500 mg | PEN_INJECTOR | SUBCUTANEOUS | 0 refills | Status: DC
  Filled 2023-10-14: qty 3, 28d supply, fill #0
  Filled 2023-10-14: qty 2, 70d supply, fill #0

## 2023-10-14 MED ORDER — AMLODIPINE BESYLATE 5 MG PO TABS
5.0000 mg | ORAL_TABLET | Freq: Every day | ORAL | 1 refills | Status: DC
  Filled 2023-10-14: qty 90, 90d supply, fill #0
  Filled 2024-01-05 (×2): qty 90, 90d supply, fill #1

## 2023-10-14 MED ORDER — OZEMPIC (0.25 OR 0.5 MG/DOSE) 2 MG/3ML ~~LOC~~ SOPN
0.5000 mg | PEN_INJECTOR | SUBCUTANEOUS | 0 refills | Status: DC
  Filled 2023-10-14: qty 3, 28d supply, fill #0
  Filled 2023-10-14: qty 3, fill #0

## 2023-10-14 MED ORDER — GLIPIZIDE 10 MG PO TABS
20.0000 mg | ORAL_TABLET | Freq: Two times a day (BID) | ORAL | 1 refills | Status: DC
  Filled 2023-10-14 – 2023-11-14 (×3): qty 360, 90d supply, fill #0
  Filled 2024-02-09: qty 360, 90d supply, fill #1

## 2023-10-14 MED ORDER — LISINOPRIL-HYDROCHLOROTHIAZIDE 20-25 MG PO TABS
1.0000 | ORAL_TABLET | Freq: Every day | ORAL | 1 refills | Status: DC
  Filled 2023-10-14 – 2024-01-05 (×2): qty 90, 90d supply, fill #0
  Filled 2024-03-31 (×2): qty 90, 90d supply, fill #1

## 2023-10-14 NOTE — Patient Instructions (Signed)
 Diabetes mellitus y nutricin, en adultos Diabetes Mellitus and Nutrition, Adult Si sufre de diabetes, o diabetes mellitus, es muy importante tener hbitos alimenticios saludables debido a que sus niveles de Psychologist, counselling sangre (glucosa) se ven afectados en gran medida por lo que come y bebe. [place] alimentos saludables en las cantidades correctas, aproximadamente a la misma hora todos los [place], [place] ayudar a: Chief Operating Officer su glucemia. Disminuir el riesgo de sufrir una enfermedad cardaca. Mejorar la presin arterial. Barista o mantener un peso saludable. Qu puede afectar mi plan de alimentacin? Todas las personas que sufren de diabetes son diferentes y cada una tiene necesidades diferentes en cuanto a un plan de alimentacin. El mdico puede recomendarle que trabaje con un nutricionista para elaborar el mejor plan para usted. Su plan de alimentacin puede variar segn factores como: Las caloras que necesita. Los medicamentos que toma. Su peso. Sus niveles de glucemia, presin arterial y colesterol. Su nivel de [place]. Otras afecciones que tenga, como enfermedades cardacas o renales. Cmo me afectan los carbohidratos? Los carbohidratos, o hidratos de carbono, afectan su nivel de glucemia ms que cualquier otro tipo de alimento. La ingesta de carbohidratos aumenta la cantidad de [employer]. Es importante conocer la cantidad de carbohidratos que se pueden ingerir en cada comida sin correr Surveyor, minerals. Esto es Government social research officer. Su nutricionista puede ayudarlo a calcular la cantidad de carbohidratos que debe ingerir en cada comida y en cada refrigerio. Cmo me afecta el alcohol? El alcohol puede provocar una disminucin de la glucemia (hipoglucemia), especialmente si [place] insulina o toma determinados medicamentos por va oral para la diabetes. La hipoglucemia es una afeccin potencialmente mortal. Los sntomas de la hipoglucemia, como somnolencia, mareos y confusin, son  similares a los sntomas de haber consumido demasiado alcohol. No beba alcohol si: Su mdico le indica no hacerlo. Est embarazada, puede estar embarazada o est tratando de [place]. Si bebe alcohol: Limite la cantidad que bebe a lo siguiente: De 0 a 1 medida por da para las mujeres. De 0 a 2 medidas por da para los hombres. Sepa cunta cantidad de alcohol hay en las bebidas que toma. En los [address], una medida equivale a una botella de cerveza de 12 oz (355 ml), un vaso de vino de 5 oz (148 ml) o un vaso de una bebida alcohlica de alta graduacin de 1 oz (44 ml). Mantngase hidratado bebiendo agua, refrescos dietticos o t helado sin azcar. Tenga en cuenta que los refrescos comunes, los jugos y otras bebidas para mezclar pueden contener Product/process development scientist y se deben contar como carbohidratos. Consejos para seguir Social worker las etiquetas de los alimentos Comience por leer el tamao de la porcin en la etiqueta de Informacin nutricional de los alimentos envasados y las bebidas. La cantidad de caloras, carbohidratos, grasas y otros nutrientes detallados en la etiqueta se basan en una porcin del alimento. Muchos alimentos contienen ms de una porcin por envase. Verifique la cantidad total de gramos (g) de carbohidratos totales en una porcin. Verifique la cantidad de gramos de grasas saturadas y grasas trans en una porcin. Escoja alimentos que no contengan estas grasas o que su contenido de estas sea [place]. Verifique la cantidad de miligramos (mg) de sal (sodio) en una porcin. La [employer]son de las personas deben limitar la ingesta de sodio total a menos de 2300 mg Google. Siempre consulte la informacin nutricional de los alimentos etiquetados como "con bajo contenido de grasa" o "sin grasa".  Estos alimentos pueden tener un mayor contenido de International aid/development worker agregada o carbohidratos refinados, y deben evitarse. Hable con su nutricionista para identificar sus objetivos diarios en  cuanto a los nutrientes mencionados en la etiqueta. Al ir de compras Evite comprar alimentos procesados, enlatados o precocidos. Estos alimentos tienden a Counselling psychologist mayor cantidad de [place], sodio y azcar agregada. Compre en la zona exterior de la tienda de comestibles. Esta es la zona donde se encuentran con mayor frecuencia las frutas y las verduras frescas, los cereales a granel, las carnes frescas y los productos lcteos frescos. Al cocinar Use mtodos de coccin a baja temperatura, como hornear, en lugar de mtodos de coccin a alta temperatura, como frer en abundante aceite. Cocine con aceites saludables, como el aceite de [place], canola o [place]. Evite cocinar con manteca, crema o carnes con alto contenido de grasa. Planificacin de las comidas Coma las comidas y los refrigerios regularmente, preferentemente a la misma hora todos [place]. Evite pasar largos perodos de tiempo sin comer. Consuma alimentos ricos en fibra, como frutas frescas, verduras, frijoles y cereales integrales. Consuma entre 4 y 6 onzas (entre 112 y 168 g) de protenas magras por da, como carnes [place], pollo, pescado, huevos o tofu. Una onza (oz) (28 g) de protena magra equivale a: 1 onza (28 g) de carne, pollo o pescado. 1 huevo.  taza (62 g) de tofu. Coma algunos alimentos por da que contengan grasas saludables, como aguacates, frutos secos, semillas y pescado. Qu alimentos debo comer? Nils Pyle Bayas. Manzanas. Naranjas. Duraznos. Damascos. Ciruelas. Uvas. Mangos. Papayas. Granadas. Kiwi. Cerezas. Verduras Verduras de Marriott, que incluyen [place], [place], col rizada, acelga, hojas de berza, hojas de mostaza y repollo. Remolachas. Coliflor. Brcoli. Zanahorias. Judas verdes. Tomates. Pimientos. Cebollas. Pepinos. Coles de Bruselas. Granos Granos integrales, como panes, galletas, tortillas, cereales y pastas de salvado o integrales. Avena sin azcar. Quinua. Arroz integral o salvaje. Carnes y otras  protenas Frutos de mar. Carne de ave sin piel. Cortes magros de ave y carne de res. Tofu. Frutos secos. Semillas. Lcteos Productos lcteos sin grasa o con bajo contenido de [place], [place], yogur y [place]. Es posible que los productos detallados arriba no constituyan una lista completa de los alimentos y las bebidas que puede tomar. Consulte a un nutricionista para obtener ms informacin. Qu alimentos debo evitar? Nils Pyle Frutas enlatadas al almbar. Verduras Verduras enlatadas. Verduras congeladas con mantequilla o salsa de crema. Granos Productos elaborados con [place] y [place], como panes, pastas, bocadillos y cereales. Evite todos los alimentos procesados. Carnes y [address] de carne con alto contenido de Holiday representative. Carne de ave con piel. Carnes empanizadas o fritas. Carne procesada. Evite las grasas saturadas. Lcteos Yogur, queso o [employer]. Bebidas Bebidas azucaradas, como gaseosas o t helado. Es posible que los productos que se enumeran ms [place] no constituyan una lista completa de los alimentos y las bebidas que Personnel officer. Consulte a un nutricionista para obtener ms informacin. Preguntas para hacerle al mdico Debo consultar con un especialista certificado en atencin y educacin sobre la diabetes? Es necesario que me rena con un nutricionista? A qu nmero puedo llamar si tengo preguntas? Cules son los mejores momentos para controlar la glucemia? Dnde encontrar ms informacin: American Diabetes Association (Asociacin Estadounidense de la Diabetes): diabetes.org Academy of Nutrition and Dietetics (Academia de Nutricin y Pension scheme manager): eatright.Dana Corporation of Diabetes and Digestive and Kidney Diseases [employer]ere & Company de la Diabetes y las Enfermedades Digestivas y Renales): StageSync.si Association of Diabetes  Care & Education Specialists (Asociacin de Especialistas en Atencin y Francella Solian la Diabetes):  diabeteseducator.org Resumen Es importante tener hbitos alimenticios saludables debido a que sus niveles de Psychologist, counselling sangre (glucosa) se ven afectados en gran medida por lo que come y bebe. Es importante consumir alcohol con prudencia. Un plan de comidas saludable lo ayudar a controlar la glucosa en sangre y a reducir el riesgo de enfermedades cardacas. El mdico puede recomendarle que trabaje con un nutricionista para elaborar el mejor plan para usted. Esta informacin no tiene Theme park manager el consejo del mdico. Asegrese de hacerle al mdico cualquier pregunta que tenga. Document Revised: 11/17/2019 Document Reviewed: 11/17/2019 Elsevier Patient Education  2024 [employer].

## 2023-10-14 NOTE — Progress Notes (Unsigned)
 Subjective:  Patient ID: Billy Gregory, male    DOB: 1980-06-03  Age: 43 y.o. MRN: [id number]  CC: Medical Management of Chronic Issues     Discussed the use of AI scribe software for clinical note transcription with the patient, who gave verbal consent to proceed.  History of Present Illness Billy Gregory is a 43 year old male with hyperlipidemia, hypertriglyceridemia, hypertension, type II diabetes who presents for follow-up of elevated A1c levels.  His A1c has increased from 9.8% to 10.3%. He has had confusion with his medication regimen, particularly taking a lower dose of his cholesterol medication atorvastatin  than prescribed. He was taking 20 mg instead of 40 mg. For his diabetes medications, he initially took only 2 tablets of glipizide  in the morning instead of the prescribed 2 in the morning and 2 in the afternoon. He corrected this about a month ago and is now taking the correct dosage. He also takes metformin  and Farxiga .  He occasionally checks his blood sugar at home, with readings ranging from 160 to 210 mg/dL. He recalls a previous episode when his blood sugar was as high as 700 mg/dL, which required insulin  treatment. Despite the elevated A1c levels, he has not been feeling unwell and continues to work in housekeeping.  He recently had an eye examination at [facility], where he was told his vision was fine, although he did not bring the report to this visit.  Past Medical History:  Diagnosis Date   Diabetes mellitus without complication (HCC)    Hypertension     History reviewed. No pertinent surgical history.  Family History  Problem Relation Age of Onset   Diabetes Paternal Aunt     Social History   Socioeconomic History   Marital status: Single    Spouse name: Not on file   Number of children: Not on file   Years of education: Not on file   Highest education level: Not on file  Occupational History    Employer: [employer]     Comment: Housekeeping  Tobacco Use   Smoking status: Never   Smokeless tobacco: Never  Substance and Sexual Activity   Alcohol use: No    Alcohol/week: 0.0 standard drinks of alcohol   Drug use: No   Sexual activity: Not on file  Other Topics Concern   Not on file  Social History Narrative   ** Merged History Encounter **       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   Social Drivers of Health   Financial Resource Strain: Not on file  Food Insecurity: Food Insecurity Present (04/03/2023)   Hunger Vital Sign    Worried About Running Out of Food in the Last Year: Sometimes true    Ran Out of Food in the Last Year: Sometimes true  Transportation Needs: No Transportation Needs (04/03/2023)   PRAPARE - Administrator, Civil Service (Medical): No    Lack of Transportation (Non-Medical): No  Physical Activity: Patient Declined (04/03/2023)   Exercise Vital Sign    Days of Exercise per Week: Patient declined    Minutes of Exercise per Session: Patient declined  Stress: Not on file  Social Connections: Socially Isolated (04/03/2023)   Social Connection and Isolation Panel    Frequency of Communication with Friends and Family: Once a week    Frequency of Social Gatherings with Friends and Family: Once a week    Attends Religious Services: Never    Database administrator or  Organizations: No    Attends Banker Meetings: Never    Marital Status: Married    No Known Allergies  Outpatient Medications Prior to Visit  Medication Sig Dispense Refill   acetaminophen  (TYLENOL ) 325 MG tablet Take 650 mg by mouth every 6 (six) hours as needed.     atorvastatin  (LIPITOR) 20 MG tablet Take 1 tablet (20 mg total) by mouth daily. 90 tablet 1   Blood Glucose Monitoring Suppl (TRUE METRIX METER) w/Device KIT USE AS INSTRUCTED 1 kit 0   cholecalciferol (VITAMIN D ) 1000 units tablet Take 5,000 Units by mouth daily.     dapagliflozin  propanediol (FARXIGA ) 10 MG TABS tablet Take  1 tablet (10 mg total) by mouth daily before breakfast. 90 tablet 1   glucose blood (TRUE METRIX BLOOD GLUCOSE TEST) test strip TEST YOUR BLOOD SUGAR ONCE DAILY IN THE MORNING 100 each 2   metFORMIN  (GLUCOPHAGE ) 500 MG tablet Take 2 tablets (1,000 mg total) by mouth 2 (two) times daily with a meal. 360 tablet 1   miconazole  (MICOTIN) 2 % cream Apply 1 application topically 2 (two) times daily. 28.35 g 0   TRUEplus Lancets 28G MISC USE TO CHECK BLOOD SUGARS DAILY. 100 each 6   amLODipine  (NORVASC ) 2.5 MG tablet Take 1 tablet (2.5 mg total) by mouth daily. 90 tablet 1   glipiZIDE  (GLUCOTROL ) 10 MG tablet Take 2 tablets (20 mg total) by mouth 2 (two) times daily before a meal. 360 tablet 1   lisinopril -hydrochlorothiazide  (ZESTORETIC ) 20-25 MG tablet Take 1 tablet by mouth daily. 90 tablet 1   No facility-administered medications prior to visit.     ROS Review of Systems  Constitutional:  Negative for activity change and appetite change.  HENT:  Negative for sinus pressure and sore throat.   Respiratory:  Negative for chest tightness, shortness of breath and wheezing.   Cardiovascular:  Negative for chest pain and palpitations.  Gastrointestinal:  Negative for abdominal distention, abdominal pain and constipation.  Genitourinary: Negative.   Musculoskeletal: Negative.   Psychiatric/Behavioral:  Negative for behavioral problems and dysphoric mood.     Objective:  BP (!) 148/78   Pulse 80   Ht 5' 4 (1.626 m)   Wt 169 lb 3.2 oz (76.7 kg)   SpO2 100%   BMI 29.04 kg/m      10/14/2023    9:33 AM 10/14/2023    9:03 AM 07/15/2023    2:10 PM  BP/Weight  Systolic BP 148 149 138  Diastolic BP 78 65 80  Wt. (Lbs)  169.2 163  BMI  29.04 kg/m2 27.98 kg/m2      Physical Exam Constitutional:      Appearance: He is well-developed.  Cardiovascular:     Rate and Rhythm: Normal rate.     Heart sounds: Normal heart sounds. No murmur heard. Pulmonary:     Effort: Pulmonary effort is  normal.     Breath sounds: Normal breath sounds. No wheezing or rales.  Chest:     Chest wall: No tenderness.  Abdominal:     General: Bowel sounds are normal. There is no distension.     Palpations: Abdomen is soft. There is no mass.     Tenderness: There is no abdominal tenderness.  Musculoskeletal:        General: Normal range of motion.     Right lower leg: No edema.     Left lower leg: No edema.  Neurological:     Mental Status: He is  alert and oriented to person, place, and time.  Psychiatric:        Mood and Affect: Mood normal.        Latest Ref Rng & Units 10/14/2023    9:49 AM 01/01/2023    9:26 AM 04/30/2022    4:29 PM  CMP  Glucose 70 - 99 mg/dL 767  856  800   BUN 6 - 24 mg/dL 14  13  19    Creatinine 0.76 - 1.27 mg/dL 9.21  9.27  9.07   Sodium 134 - 144 mmol/L 138  138  138   Potassium 3.5 - 5.2 mmol/L 4.6  4.3  3.5   Chloride 96 - 106 mmol/L 101  103  97   CO2 20 - 29 mmol/L 21  22  24    Calcium  8.7 - 10.2 mg/dL 9.5  9.2  9.8   Total Protein 6.0 - 8.5 g/dL 6.9   7.7   Total Bilirubin 0.0 - 1.2 mg/dL 0.9   0.7   Alkaline Phos 44 - 121 IU/L 57   53   AST 0 - 40 IU/L 19   17   ALT 0 - 44 IU/L 25   22     Lipid Panel     Component Value Date/Time   CHOL 131 10/14/2023 0949   TRIG 98 10/14/2023 0949   HDL 36 (L) 10/14/2023 0949   CHOLHDL 3.1 08/02/2021 1035   CHOLHDL 4.1 06/11/2016 1441   VLDL 26 06/11/2016 1441   LDLCALC 76 10/14/2023 0949    CBC    Component Value Date/Time   WBC 8.4 08/02/2021 1035   WBC 9.2 09/19/2015 1719   RBC 5.01 08/02/2021 1035   RBC 5.08 09/19/2015 1719   HGB 14.9 08/02/2021 1035   HCT 42.7 08/02/2021 1035   PLT 213 08/02/2021 1035   MCV 85 08/02/2021 1035   MCH 29.7 08/02/2021 1035   MCH 29.3 09/19/2015 1719   MCHC 34.9 08/02/2021 1035   MCHC 35.6 09/19/2015 1719   RDW 12.5 08/02/2021 1035   LYMPHSABS 2.3 08/02/2021 1035   MONOABS 552 09/19/2015 1719   EOSABS 0.3 08/02/2021 1035   BASOSABS 0.1 08/02/2021 1035     Lab Results  Component Value Date   HGBA1C 10.3 (A) 10/14/2023    Lab Results  Component Value Date   HGBA1C 10.3 (A) 10/14/2023   HGBA1C 9.8 (A) 07/15/2023   HGBA1C 9.7 (A) 04/03/2023      1. Type 2 diabetes mellitus with other specified complication, without long-term current use of insulin  (HCC) (Primary) Uncontrolled with A1c of 10.3 Ozempic  added to regimen He will see the clinical pharmacist to assist with up titration of Ozempic  to maximum tolerable dose Counseled on Diabetic diet, the healthy plate, 849 minutes of moderate intensity exercise/week Blood sugar logs with fasting goals of 80-120 mg/dl, random of less than 819 and in the event of sugars less than 60 mg/dl or greater than 599 mg/dl encouraged to notify the clinic. Advised on the need for annual eye exams, annual foot exams, Pneumonia vaccine. - POCT glycosylated hemoglobin (Hb A1C) - Microalbumin / creatinine urine ratio - LP+Non-HDL Cholesterol - CMP14+EGFR - Semaglutide ,0.25 or 0.5MG /DOS, (OZEMPIC , 0.25 OR 0.5 MG/DOSE,) 2 MG/3ML SOPN; Inject 0.25 mg into the skin once a week. For 4 weeks then increase to 0.5mg   Dispense: 3 mL; Refill: 0 - Semaglutide ,0.25 or 0.5MG /DOS, (OZEMPIC , 0.25 OR 0.5 MG/DOSE,) 2 MG/3ML SOPN; Inject 0.5 mg into the skin once a week. For 4  weeks then increase to 1mg   Dispense: 3 mL; Refill: 0 - POCT glucose (manual entry)  2. Type 2 diabetes mellitus with hyperglycemia, unspecified whether long term insulin  use (HCC) See #1 above - glipiZIDE  (GLUCOTROL ) 10 MG tablet; Take 2 tablets (20 mg total) by mouth 2 (two) times daily before a meal.  Dispense: 360 tablet; Refill: 1  3. Long term current use of oral hypoglycemic drug See #1 above - glipiZIDE  (GLUCOTROL ) 10 MG tablet; Take 2 tablets (20 mg total) by mouth 2 (two) times daily before a meal.  Dispense: 360 tablet; Refill: 1  4. HTN (hypertension), benign Not at goal Amlodipine  increased from 2.5 mg to 5 mg Counseled on blood  pressure goal of less than 130/80, low-sodium, DASH diet, medication compliance, 150 minutes of moderate intensity exercise per week. Discussed medication compliance, adverse effects. - lisinopril -hydrochlorothiazide  (ZESTORETIC ) 20-25 MG tablet; Take 1 tablet by mouth daily.  Dispense: 90 tablet; Refill: 1 - amLODipine  (NORVASC ) 5 MG tablet; Take 1 tablet (5 mg total) by mouth daily.  Dispense: 90 tablet; Refill: 1  5. Need for Streptococcus pneumoniae vaccination - Pneumococcal conjugate vaccine 20-valent  6. Long-term current use of injectable noninsulin antidiabetic medication See #1 above.   Meds ordered this encounter  Medications   glipiZIDE  (GLUCOTROL ) 10 MG tablet    Sig: Take 2 tablets (20 mg total) by mouth 2 (two) times daily before a meal.    Dispense:  360 tablet    Refill:  1   lisinopril -hydrochlorothiazide  (ZESTORETIC ) 20-25 MG tablet    Sig: Take 1 tablet by mouth daily.    Dispense:  90 tablet    Refill:  1   Semaglutide ,0.25 or 0.5MG /DOS, (OZEMPIC , 0.25 OR 0.5 MG/DOSE,) 2 MG/3ML SOPN    Sig: Inject 0.25 mg into the skin once a week. For 4 weeks then increase to 0.5mg     Dispense:  3 mL    Refill:  0   Semaglutide ,0.25 or 0.5MG /DOS, (OZEMPIC , 0.25 OR 0.5 MG/DOSE,) 2 MG/3ML SOPN    Sig: Inject 0.5 mg into the skin once a week. For 4 weeks then increase to 1mg     Dispense:  3 mL    Refill:  0   amLODipine  (NORVASC ) 5 MG tablet    Sig: Take 1 tablet (5 mg total) by mouth daily.    Dispense:  90 tablet    Refill:  1    Dose increase    Follow-up: Return in about 1 month (around 11/14/2023) for Blood sugar evaluation with Herlene, Medical conditions with PCP, in 3 months.       Corrina Sabin, MD, FAAFP. [facility] [place], [place] [phone]   10/15/2023, 8:44 AM

## 2023-10-15 ENCOUNTER — Encounter: Payer: Self-pay | Admitting: Family Medicine

## 2023-10-16 ENCOUNTER — Ambulatory Visit: Payer: Self-pay | Admitting: Family Medicine

## 2023-10-16 LAB — CMP14+EGFR
ALT: 25 IU/L (ref 0–44)
AST: 19 IU/L (ref 0–40)
Albumin: 4.5 g/dL (ref 4.1–5.1)
Alkaline Phosphatase: 57 IU/L (ref 44–121)
BUN/Creatinine Ratio: 18 (ref 9–20)
BUN: 14 mg/dL (ref 6–24)
Bilirubin Total: 0.9 mg/dL (ref 0.0–1.2)
CO2: 21 mmol/L (ref 20–29)
Calcium: 9.5 mg/dL (ref 8.7–10.2)
Chloride: 101 mmol/L (ref 96–106)
Creatinine, Ser: 0.78 mg/dL (ref 0.76–1.27)
Globulin, Total: 2.4 g/dL (ref 1.5–4.5)
Glucose: 232 mg/dL — ABNORMAL HIGH (ref 70–99)
Potassium: 4.6 mmol/L (ref 3.5–5.2)
Sodium: 138 mmol/L (ref 134–144)
Total Protein: 6.9 g/dL (ref 6.0–8.5)
eGFR: 113 mL/min/1.73 (ref >59)

## 2023-10-16 LAB — LP+NON-HDL CHOLESTEROL
Cholesterol, Total: 131 mg/dL (ref 100–199)
HDL: 36 mg/dL — ABNORMAL LOW (ref >39)
LDL Chol Calc (NIH): 76 mg/dL (ref 0–99)
Total Non-HDL-Chol (LDL+VLDL): 95 mg/dL (ref 0–129)
Triglycerides: 98 mg/dL (ref 0–149)
VLDL Cholesterol Cal: 19 mg/dL (ref 5–40)

## 2023-10-16 LAB — MICROALBUMIN / CREATININE URINE RATIO
Creatinine, Urine: 113.4 mg/dL
Microalb/Creat Ratio: 54 mg/g{creat} — AB (ref 0–29)
Microalbumin, Urine: 61.4 ug/mL

## 2023-11-13 ENCOUNTER — Telehealth: Payer: Self-pay | Admitting: Family Medicine

## 2023-11-13 NOTE — Telephone Encounter (Signed)
 Confirmed appt for 8/22

## 2023-11-14 ENCOUNTER — Encounter: Payer: Self-pay | Admitting: Pharmacist

## 2023-11-14 ENCOUNTER — Ambulatory Visit: Payer: Self-pay | Attending: Family Medicine | Admitting: Pharmacist

## 2023-11-14 ENCOUNTER — Other Ambulatory Visit: Payer: Self-pay

## 2023-11-14 ENCOUNTER — Telehealth: Payer: Self-pay | Admitting: Pharmacist

## 2023-11-14 DIAGNOSIS — Z7985 Long-term (current) use of injectable non-insulin antidiabetic drugs: Secondary | ICD-10-CM

## 2023-11-14 DIAGNOSIS — E1169 Type 2 diabetes mellitus with other specified complication: Secondary | ICD-10-CM

## 2023-11-14 DIAGNOSIS — Z7984 Long term (current) use of oral hypoglycemic drugs: Secondary | ICD-10-CM

## 2023-11-14 MED ORDER — OZEMPIC (0.25 OR 0.5 MG/DOSE) 2 MG/3ML ~~LOC~~ SOPN
0.5000 mg | PEN_INJECTOR | SUBCUTANEOUS | 1 refills | Status: DC
  Filled 2023-11-14: qty 3, 28d supply, fill #0
  Filled 2023-12-25: qty 3, 28d supply, fill #1

## 2023-11-14 NOTE — Patient Instructions (Addendum)
 Gracias por venir a verme hoy. Por favor, haga lo siguiente:  1. Inyecte 0.5 mg de Ozempic  una vez a la semana. 2. Contine con metformina. Tome 2 comprimidos [employer]. 3. Contine con glipizida. Tome 2 comprimidos [employer]. 4. Comience con Farxiga . Tmelo una vez al da.      English translation:   Thank you for coming to see me today. Please do the following:  Inject 0.5 mg of Ozempic  once a week.  Continue metformin . Take 2 tablets twice a day.  Continue glipizide . Take 2 tablets twice a day.  Start Farxiga . Take once a day.

## 2023-11-14 NOTE — Progress Notes (Signed)
 Interpretor ID # [id number], Name: [name]  S:    Billy Gregory is a 43 y.o. male who presents for diabetes evaluation, education, and management. PMH is significant for HTN, T2DM (since 2016), and DLD.  Primary Care Provider is Dr. Newlin. Patient was referred and last seen by Dr. Newlin on 10/14/2023. At that visit, A1c was 10.3, up from 9.8 prior. BP was 148/78 mmhg.   Today, patient arrives in good spirits. Brings in his medications for review.   Patient reports taking all medications as prescribed. Of note, he tells me has injected Ozempic  four times. He doesn't think his first two injections were successful. Additionally, he has been injecting 0.5 mg weekly. Endorses some nausea/vomiting since starting Ozempic  but this has improved. He feels today that he can continue taking the medication. He denies any abdominal pain or changes in vision. In addition to the Ozempic , he is taking the metformin  and glipizide . He is not taking the Farxiga . Patient notes improvement in blood glucose and symptoms since starting Ozempic . He brings his meter in for review. Patient denies hypoglycemic events or any symptoms of hyperglycemia (denies polyuria, neuropathy, or visual changes).  Current diabetes medications include: glipizide  10mg  BID (only takes with food), Farxiga  10 mg daily, metformin  1000mg  BID, Ozempic  0.5 mg weekly.  Current hypertension medications include: amlodipine  5 mg daily, lisinopril  20mg /HCTZ 25mg  daily  Brought blood glucose log from home.  Reported home fasting blood sugars:  -7 day avg: 160 mg/dL - 30 day avg: 797 mg/dL  Patient reported dietary habits: Eats 3 meals/day Patient-reported exercise habits: works outside  O:  [employer] Value Date   HGBA1C 10.3 (A) 10/14/2023   There were no vitals filed for this visit.  Lipid Panel     Component Value Date/Time   CHOL 131 10/14/2023 0949   TRIG 98 10/14/2023 0949   HDL 36 (L) 10/14/2023 0949   CHOLHDL  3.1 08/02/2021 1035   CHOLHDL 4.1 06/11/2016 1441   VLDL 26 06/11/2016 1441   LDLCALC 76 10/14/2023 0949   Clinical Atherosclerotic Cardiovascular Disease (ASCVD):  The 10-year ASCVD risk score (Arnett DK, et al., 2019) is: 3.8%   Values used to calculate the score:     Age: 43 years     Clincally relevant sex: Male     Is Non-Hispanic African American: No     Diabetic: Yes     Tobacco smoker: No     Systolic Blood Pressure: 148 mmHg     Is BP treated: Yes     HDL Cholesterol: 36 mg/dL     Total Cholesterol: 131 mg/dL   A/P: Diabetes longstanding currently above goal with last A1c 10.3%. His glucometer shows an improvement with Ozempic  0.5 mg weekly but he started with the 0.5 mg weekly dose instead of the 0.25 mg dose. He has experienced some GI side effects due to this but is tolerating okay overall. He wishes to continue on Ozemipc and we will have him continue on the 0.5 mg weekly dose for now. I encouraged patient to go downstairs and fill out Farxiga  and Ozempic  patient assistance forms. He tells me he will do so. Lastly, I encouraged continued adherence to metformin  and glipizide .  -Re-start Farxiga  10mg  daily.  -Continue Ozempic  0.5 mg weekly.  -Encouraged patient to return to our pharmacy downstairs to fill out patient assistance forms.  -Continue metformin  and glipizide  at current doses.  -Extensively discussed pathophysiology of diabetes, recommended lifestyle interventions, dietary effects on blood  sugar control.  -Continue to check blood glucose at home. Reviewed technique including wiping away first drop of blood after using alcohol swab. Encouraged to bring log to next visit. -Next A1c anticipated 12/2023.   Written patient instructions provided. Patient verbalized understanding of treatment plan. Total time in face to face counseling 30 minutes.    Follow up with me in 1 month.  PCP: 01/14/24  Thank you for allowing pharmacy to participate in this patient's  care.  Herlene Fleeta Morris, PharmD, JAQUELINE, CPP Clinical Pharmacist [facility] [phone]

## 2023-11-14 NOTE — Telephone Encounter (Signed)
 Hey friend,   This patient was instructed to come down and sign PASS apps for Farxiga  and Ozempic  today (11/14/23). Can you let me know if we were able to submit these apps?

## 2023-11-17 ENCOUNTER — Other Ambulatory Visit: Payer: Self-pay

## 2023-11-18 ENCOUNTER — Other Ambulatory Visit (HOSPITAL_COMMUNITY): Payer: Self-pay

## 2023-11-19 ENCOUNTER — Telehealth: Payer: Self-pay

## 2023-11-19 ENCOUNTER — Other Ambulatory Visit: Payer: Self-pay

## 2023-11-19 NOTE — Telephone Encounter (Signed)
 Copied from CRM [id number]. Topic: General - Other >> Nov 19, 2023  1:08 PM Zebedee SAUNDERS wrote: Reason for CRM: Received call from [facility] per [facility] ph: [phone] fax: [phone] application was received on 11/17/2023. Kema does have questions and need to speak with nurse or doctor. Please call [phone].

## 2023-11-19 NOTE — Telephone Encounter (Signed)
 Received notification from [employer] regarding approval for FARXIGA . Patient assistance approved from 11/19/2023 to 11/18/2024.  Medication will ship to [address], [id number]  Pt ID: [id number]  Company phone: [phone]

## 2023-11-19 NOTE — Telephone Encounter (Signed)
 FYI

## 2023-11-20 ENCOUNTER — Other Ambulatory Visit: Payer: Self-pay

## 2023-12-08 ENCOUNTER — Other Ambulatory Visit: Payer: Self-pay

## 2023-12-24 ENCOUNTER — Telehealth: Payer: Self-pay | Admitting: Family Medicine

## 2023-12-24 ENCOUNTER — Other Ambulatory Visit: Payer: Self-pay

## 2023-12-24 ENCOUNTER — Other Ambulatory Visit: Payer: Self-pay | Admitting: Family Medicine

## 2023-12-24 DIAGNOSIS — E1165 Type 2 diabetes mellitus with hyperglycemia: Secondary | ICD-10-CM

## 2023-12-24 DIAGNOSIS — Z7984 Long term (current) use of oral hypoglycemic drugs: Secondary | ICD-10-CM

## 2023-12-24 MED ORDER — METFORMIN HCL 500 MG PO TABS
1000.0000 mg | ORAL_TABLET | Freq: Two times a day (BID) | ORAL | 1 refills | Status: DC
  Filled 2023-12-24 – 2024-01-26 (×2): qty 360, 90d supply, fill #0

## 2023-12-24 NOTE — Telephone Encounter (Signed)
 Confirmed appt for 10/1

## 2023-12-25 ENCOUNTER — Other Ambulatory Visit: Payer: Self-pay

## 2023-12-25 ENCOUNTER — Ambulatory Visit: Payer: Self-pay | Attending: Family Medicine | Admitting: Pharmacist

## 2023-12-25 ENCOUNTER — Encounter: Payer: Self-pay | Admitting: Pharmacist

## 2023-12-25 DIAGNOSIS — Z7984 Long term (current) use of oral hypoglycemic drugs: Secondary | ICD-10-CM

## 2023-12-25 DIAGNOSIS — E1165 Type 2 diabetes mellitus with hyperglycemia: Secondary | ICD-10-CM

## 2023-12-25 DIAGNOSIS — E1169 Type 2 diabetes mellitus with other specified complication: Secondary | ICD-10-CM

## 2023-12-25 DIAGNOSIS — Z7985 Long-term (current) use of injectable non-insulin antidiabetic drugs: Secondary | ICD-10-CM

## 2023-12-25 MED ORDER — OZEMPIC (0.25 OR 0.5 MG/DOSE) 2 MG/3ML ~~LOC~~ SOPN
0.2500 mg | PEN_INJECTOR | SUBCUTANEOUS | 1 refills | Status: DC
  Filled 2023-12-25: qty 3, 28d supply, fill #0
  Filled 2024-01-24 – 2024-01-25 (×2): qty 3, 28d supply, fill #1

## 2023-12-25 NOTE — Progress Notes (Signed)
 Interpretor ID # [id number], Name: [name]  S:    Billy Gregory is a 42 y.o. male who presents for diabetes evaluation, education, and management. PMH is significant for HTN, T2DM (since 2016), and DLD.  Primary Care Provider is Dr. Newlin. Patient was referred and last seen by Dr. Newlin on 10/14/2023. At that visit, A1c was 10.3, up from 9.8 prior. BP was 148/78 mmhg.   I saw him on 11/14/2023. He endorsed medication adherence. Had some issues with initial injections but was taking Ozempic  without problems by the time I saw him. Endorsed adherence to his metformin  and glipizide  but was not taking Farxiga . He needed to complete a patient assistance application and had not done so. I had him restart Farxiga  and continue Ozempic . I directed him to our pharmacy to complete his patient assistance applications.  Today, patient arrives in good spirits. Brings in his medications for review. Patient reports taking all medications as prescribed. He was approved for Farxiga  PASS in August after my last visit with him. He is taking this, glipizide , metformin , and Ozempic . He is still taking Ozempic  0.25 mg weekly and endorses nausea that happens several times weekly. He brings his meter in for review. Patient denies hypoglycemic events or any symptoms of hyperglycemia.  Current diabetes medications include: glipizide  10mg  BID, Farxiga  10 mg daily, metformin  1000mg  BID, Ozempic  0.25 mg weekly (Saturday).  Current hypertension medications include: amlodipine  5 mg daily, lisinopril  20mg /HCTZ 25mg  daily  Brought blood glucose log from home.  Reported home fasting blood sugars:  -7 day avg: 123 (160 mg/dL) - 30 day avg: 869 (797 mg/dL) -Values over the last 2 weeks: 116, 114, 149, 109, 124, 146, 135, 139, 108, 125, 115, 129, 133, 145, 128, 150, 133  Patient reported dietary habits: Eats 3 meals/day Patient-reported exercise habits: works outside  O:  [employer] Value Date   HGBA1C 10.3 (A)  10/14/2023   There were no vitals filed for this visit.  Lipid Panel     Component Value Date/Time   CHOL 131 10/14/2023 0949   TRIG 98 10/14/2023 0949   HDL 36 (L) 10/14/2023 0949   CHOLHDL 3.1 08/02/2021 1035   CHOLHDL 4.1 06/11/2016 1441   VLDL 26 06/11/2016 1441   LDLCALC 76 10/14/2023 0949   Clinical Atherosclerotic Cardiovascular Disease (ASCVD):  The 10-year ASCVD risk score (Arnett DK, et al., 2019) is: 3.8%   Values used to calculate the score:     Age: 32 years     Clincally relevant sex: Male     Is Non-Hispanic African American: No     Diabetic: Yes     Tobacco smoker: No     Systolic Blood Pressure: 148 mmHg     Is BP treated: Yes     HDL Cholesterol: 36 mg/dL     Total Cholesterol: 131 mg/dL   A/P: Diabetes longstanding currently above goal with last A1c 10.3%. His glucometer shows goal glycemic control at home. He is still taking Ozempic  at the 0.25 mg weekly dose and endorses nausea. He denies any vomiting or abdominal pain. He wishes to stay on Ozempic  given the improvement in his home blood sugar control.  -Continue Farxiga  10mg  daily.  -Continue Ozempic  0.25 mg weekly.  -Continue metformin  and glipizide  at current doses.  -Extensively discussed pathophysiology of diabetes, recommended lifestyle interventions, dietary effects on blood sugar control.  -Continue to check blood glucose at home. Reviewed technique including wiping away first drop of blood after using alcohol  swab. Encouraged to bring log to next visit. -Next A1c anticipated 12/2023.   Written patient instructions provided. Patient verbalized understanding of treatment plan. Total time in face to face counseling 30 minutes.    Follow up with me in 2 months PCP: 01/14/24  Thank you for allowing pharmacy to participate in this patient's care.  Herlene Fleeta Morris, PharmD, JAQUELINE, CPP Clinical Pharmacist [facility] [phone]

## 2024-01-05 ENCOUNTER — Other Ambulatory Visit: Payer: Self-pay | Admitting: Family Medicine

## 2024-01-05 ENCOUNTER — Other Ambulatory Visit: Payer: Self-pay

## 2024-01-05 DIAGNOSIS — E119 Type 2 diabetes mellitus without complications: Secondary | ICD-10-CM

## 2024-01-05 MED ORDER — TRUEPLUS LANCETS 28G MISC
6 refills | Status: AC
  Filled 2024-01-05: qty 100, 100d supply, fill #0

## 2024-01-05 MED ORDER — TRUE METRIX BLOOD GLUCOSE TEST VI STRP
ORAL_STRIP | 2 refills | Status: AC
Start: 2024-01-05 — End: ?
  Filled 2024-01-05: qty 100, 100d supply, fill #0

## 2024-01-06 ENCOUNTER — Other Ambulatory Visit: Payer: Self-pay

## 2024-01-09 ENCOUNTER — Other Ambulatory Visit: Payer: Self-pay

## 2024-01-14 ENCOUNTER — Ambulatory Visit: Payer: Self-pay | Attending: Family Medicine | Admitting: Family Medicine

## 2024-01-14 ENCOUNTER — Encounter: Payer: Self-pay | Admitting: Family Medicine

## 2024-01-14 ENCOUNTER — Other Ambulatory Visit: Payer: Self-pay

## 2024-01-14 VITALS — BP 127/77 | HR 80 | Temp 98.5°F | Ht 64.0 in | Wt 163.8 lb

## 2024-01-14 DIAGNOSIS — E1169 Type 2 diabetes mellitus with other specified complication: Secondary | ICD-10-CM

## 2024-01-14 DIAGNOSIS — E781 Pure hyperglyceridemia: Secondary | ICD-10-CM

## 2024-01-14 DIAGNOSIS — Z7984 Long term (current) use of oral hypoglycemic drugs: Secondary | ICD-10-CM

## 2024-01-14 DIAGNOSIS — M79601 Pain in right arm: Secondary | ICD-10-CM

## 2024-01-14 DIAGNOSIS — Z7985 Long-term (current) use of injectable non-insulin antidiabetic drugs: Secondary | ICD-10-CM

## 2024-01-14 DIAGNOSIS — M79621 Pain in right upper arm: Secondary | ICD-10-CM

## 2024-01-14 DIAGNOSIS — I10 Essential (primary) hypertension: Secondary | ICD-10-CM

## 2024-01-14 DIAGNOSIS — E785 Hyperlipidemia, unspecified: Secondary | ICD-10-CM

## 2024-01-14 DIAGNOSIS — R809 Proteinuria, unspecified: Secondary | ICD-10-CM

## 2024-01-14 LAB — POCT GLYCOSYLATED HEMOGLOBIN (HGB A1C): HbA1c, POC (controlled diabetic range): 7.1 % — AB (ref 0.0–7.0)

## 2024-01-14 MED ORDER — AMLODIPINE BESYLATE 5 MG PO TABS
5.0000 mg | ORAL_TABLET | Freq: Every day | ORAL | 1 refills | Status: DC
  Filled 2024-01-14 – 2024-03-31 (×2): qty 90, 90d supply, fill #0

## 2024-01-14 MED ORDER — ATORVASTATIN CALCIUM 20 MG PO TABS
20.0000 mg | ORAL_TABLET | Freq: Every day | ORAL | 1 refills | Status: DC
  Filled 2024-01-14 – 2024-02-09 (×2): qty 90, 90d supply, fill #0
  Filled 2024-03-31: qty 90, 90d supply, fill #1

## 2024-01-14 NOTE — Patient Instructions (Signed)
Ejercicios para los hombros Shoulder Exercises Pregunte al mdico qu ejercicios son seguros para usted. Haga los ejercicios exactamente como se lo haya indicado el mdico y gradelos como se lo hayan indicado. Es normal sentir un leve estiramiento, tironeo, opresin o Dentist al [employer] ejercicios. Detngase de inmediato si siente un dolor repentino o Community education officer. No comience a hacer estos ejercicios hasta que se lo indique el mdico. Ejercicios de estiramiento Rotacin externa y abduccin Este ejercicio a veces se denomina estiramiento en una esquina. El ejercicio rota el brazo Company secretary (rotacin externa) y aleja el brazo del cuerpo (abduccin). Prese en una entrada de una puerta con un pie adelante del otro, a una corta distancia uno del otro. Esto se denomina escalonamiento. Si no llega a apoyar los [employer] de la [place], prese frente a una esquina de la habitacin. Elija una de estas posiciones, como se lo haya indicado el mdico: Coloque las manos y los antebrazos sobre el marco de la [place], por encima de la cabeza. Coloque las manos y los antebrazos sobre el marco de la puerta, a la altura de la cabeza. Coloque las manos y los antebrazos sobre el marco de la puerta, a la altura de los codos. Lentamente, lleve el peso al pie de adelante hasta sentir un estiramiento en el pecho y en la parte de adelante de los hombros. Mantenga la cabeza y el pecho erguidos, y los msculos abdominales tensionados. Mantenga esta posicin durante __________ segundos. Para aflojar el estiramiento, lleve el peso al pie de atrs. Repita __________ veces. Realice este ejercicio __________ veces al da. Extensin, de pie  Prese y sostenga un palo de escoba, un bastn o un objeto similar detrs de la espalda. Las manos deben estar separadas a una distancia un poco mayor que el ancho de sus hombros. Las palmas deben estar en direccin contraria a la espalda. Manteniendo los codos  extendidos y los msculos de los hombros relajados, aleje el palo del cuerpo hasta sentir un estiramiento en el hombro (extensin). No encoja los hombros al Technical brewer. Mantenga los omplatos juntos, llvelos hacia el centro de la espalda. Mantenga esta posicin durante __________ segundos. Vuelva lentamente a la posicin inicial. Repita __________ veces. Realice este ejercicio __________ veces al da. Ejercicios de amplitud de movimientos Pndulo  Prese cerca de una pared o de una superficie de la que pueda sostenerse para Pharmacologist el equilibrio. Flexione la cintura y deje que el brazo izquierdo/derecho cuelgue extendido. Use el otro brazo para apoyarse. Mantenga la espalda derecha y no trabe las rodillas. Relaje los msculos del brazo y el hombro izquierdo/derecho, y Armed forces technical officer la cadera y el tronco de modo que el brazo izquierdo/derecho se balancee libremente. El brazo debe balancearse por el movimiento del cuerpo, no por la fuerza de los msculos del brazo o el hombro. Contine moviendo las caderas y el tronco de modo que el brazo se balancee en las siguientes direcciones, como se lo haya indicado el mdico: De lado a lado. Hacia adelante y [place]. En crculos, en el sentido de las agujas del reloj y en sentido contrario. Contine cada movimiento durante __________ segundos o durante el tiempo que le haya indicado el mdico. Vuelva lentamente a la posicin inicial. Repita __________ veces. Realice este ejercicio __________ veces al da. Flexin del hombro, de pie  Prese y sostenga un palo de escoba, un bastn o un objeto similar. Coloque las manos separadas a una distancia un poco mayor que el  ancho de sus hombros. La mano izquierda/derecha debe estar con la palma [place], y la otra mano debe estar con la palma hacia abajo. Mantenga el codo extendido y los msculos del hombro relajados. Eleve el palo con el brazo sano para levantar el brazo izquierdo/derecho frente al cuerpo y  luego sobre la cabeza hasta sentir un estiramiento en el hombro (flexin). Evite encoger el hombro mientras levanta el brazo. Mantenga los omplatos juntos, llvelos hacia el centro de la espalda. Mantenga esta posicin durante __________ segundos. Vuelva lentamente a la posicin inicial. Repita __________ veces. Realice este ejercicio __________ veces al da. Abduccin del hombro, de pie  Prese y sostenga un palo de escoba, un bastn o un objeto similar. Coloque las manos separadas a una distancia un poco mayor que el ancho de sus hombros. [place] izquierda/derecha debe estar con la palma [place] arriba, y la otra mano debe estar con la palma hacia abajo. Mantenga el codo extendido y los msculos del hombro relajados. Empuje el objeto cruzando el cuerpo hacia el lado izquierdo/derecho. Levante el brazo izquierdo/derecho al costado del cuerpo (abduccin) y luego sobre la cabeza hasta sentir un estiramiento en el hombro. No levante el brazo por encima de la altura del hombro, a menos que el mdico se lo indique. Si se lo indican, eleve el brazo [employer]. Evite encoger el hombro mientras levanta el brazo. Mantenga los omplatos juntos, llvelos hacia el centro de la espalda. Mantenga esta posicin durante __________ segundos. Vuelva lentamente a la posicin inicial. Repita __________ veces. Realice este ejercicio __________ veces al da. Rotacin interna  Coloque la mano izquierda/derecha detrs de la espalda, con la palma [place] arriba. Use la otra mano para tener colgando una banda para ejercicios, un palo de [place] o un objeto similar sobre el hombro. Agarre la banda con la mano izquierda/derecha, de modo de tener agarrados ambos extremos. Suavemente, tire de la banda hacia arriba hasta sentir un estiramiento en la parte de adelante del hombro izquierdo/derecho. El movimiento del brazo hacia el centro del cuerpo se llama rotacin interna. Evite encoger el hombro mientras levanta el brazo.  Mantenga los omplatos juntos, llvelos hacia el centro de la espalda. Mantenga esta posicin durante __________ segundos. Afloje el estiramiento, suelte la banda y [employer]. Repita __________ veces. Realice este ejercicio __________ veces al da. Ejercicios de fortalecimiento Rotacin externa  Sintese en una silla estable que no tenga apoyabrazos. Ate una banda para ejercicios a un objeto estable a la altura del codo del lado izquierdo/derecho. Coloque un objeto blando, como una toalla doblada o una almohada pequea entre la parte superior del brazo izquierdo/derecho y el cuerpo, de forma que el codo est separado del costado del cuerpo a una distancia de unas 4 pulgadas (10 cm). Sostenga el extremo de la banda para ejercicios de modo que quede tensa y no se afloje. Con el codo presionado sobre el objeto blando, aleje lentamente el antebrazo del abdomen (rotacin externa). No mueva el cuerpo; solo Company secretary. Mantenga esta posicin durante __________ segundos. Vuelva lentamente a la posicin inicial. Repita __________ veces. Realice este ejercicio __________ veces al da. Abduccin del hombro  Sintese en una silla estable que no tenga apoyabrazos o pngase de pie. Sostenga una pesa de __________ libras/kg con la mano izquierda/derecha, o una banda para ejercicios con ambas manos. Comience con los brazos extendidos hacia abajo y con la palma izquierda/derecha hacia adentro, en direccin a su cuerpo. Lentamente, levante la mano izquierda/derecha [employer]  el costado (abduccin). No levante la mano por encima de la altura del hombro, a menos que el mdico le diga que no hay riesgos. Mantenga los brazos extendidos. No encoja los hombros al [employer]. Mantenga los omplatos juntos, llvelos hacia el centro de la espalda. Mantenga esta posicin durante __________ segundos. Baje lentamente el brazo y vuelva a la posicin inicial. Repita __________ veces. Realice  este ejercicio __________ veces al da. Extensin del hombro  Sintese en una silla estable que no tenga apoyabrazos o pngase de pie. Ate una banda para ejercicios a un objeto estable que est frente a usted, de modo que la banda est a la altura del hombro. Sostenga un extremo de la banda para ejercicios en cada mano. Extienda los codos y [employer] manos a la altura de los hombros. [employer] omplatos a medida que baja las manos hacia los costados de los muslos (extensin). Detngase cuando las manos estn en la misma posicin en ambos costados. No deje que las manos vayan hacia atrs del cuerpo. Mantenga esta posicin durante __________ segundos. Vuelva lentamente a la posicin inicial. Repita __________ veces. Realice este ejercicio __________ veces al da. Remo con los hombros  Sintese en una silla estable que no tenga apoyabrazos o pngase de pie. Ate una banda para ejercicios a un objeto estable que est delante de usted, de modo que la banda quede a la altura del pecho. Sostenga un extremo de la banda para ejercicios en cada mano. Coloque las palmas de modo que los pulgares queden hacia el techo (posicin [place]). Flexione ambos codos en ngulo de 90 grados (ngulo recto) y [employer] parte superior de los brazos a los costados. [place] hacia atrs o mueva la silla hacia atrs hasta que la banda quede tensa y no se afloje. Lentamente, lleve los codos hacia atrs de su cuerpo. Mantenga esta posicin durante __________ segundos. Vuelva lentamente a la posicin inicial. Repita __________ veces. Realice este ejercicio __________ veces al da. Flexiones de hombros  Sintese en una silla estable que tenga apoyabrazos. Sintese erguido, con los pies apoyados en el suelo. Coloque las [employer] apoyabrazos, con los codos flexionados y los dedos apuntando hacia adelante. Las manos deben estar parejas a los lados de su cuerpo. Presione con las [employer] apoyabrazos y use los  brazos para levantarse de la silla. Extienda los codos y levntese todo lo que pueda, sin estar incmodo. Lleve los omplatos hacia abajo y no levante los hombros hacia las [place]. Mantenga los pies en el suelo. A medida que adquiere ms fuerza, los pies deben sostener menos peso del cuerpo [place] se [place]. Mantenga esta posicin durante __________ segundos. Lentamente, vuelva a sentarse en la silla. Repita __________ veces. Realice este ejercicio __________ veces al da. Flexiones de [employer] pared  Prese frente a una pared estable. Coloque los pies separados de la pared a una distancia aproximada de un brazo. Inclnese hacia adelante y coloque las palmas sobre la pared a la altura de los hombros. Sin levantar los pies del suelo, flexione los codos e inclnese hacia la pared. Mantenga esta posicin durante __________ segundos. Extienda los codos para regresar a la posicin inicial. Repita __________ [employer]. Realice este ejercicio __________ veces al da. Esta informacin no tiene Theme park manager el consejo del mdico. Asegrese de hacerle al mdico cualquier pregunta que tenga. Document Revised: 05/21/2021 Document Reviewed: 05/21/2021 Elsevier Patient Education  2024 [employer].

## 2024-01-14 NOTE — Progress Notes (Signed)
 Subjective:  Patient ID: Billy Gregory, male    DOB: 1980-12-31  Age: 43 y.o. MRN: [id number]  CC: Medical Management of Chronic Issues     Discussed the use of AI scribe software for clinical note transcription with the patient, who gave verbal consent to proceed.  History of Present Illness Billy Gregory is a 43 year old male with a history of hyperlipidemia, hypertriglyceridemia, hypertension, type II diabetes who presents for a follow-up visit.  His most recent A1c is 7.1, showing improvement from a previous level of 10.3. He manages diabetes with medication and lifestyle changes. He has been adherent with his Statin. At his last visit the dose of Amlodipine  was increased from 2.5 to 5mg  and his BP is normal today.  He experiences pain in the right arm for about a week, described as bothersome and similar to tiredness, not weakness. 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 There is no numbness or radiating pain from the neck, and no neck pain is present.  His right hand feels numb in very cold conditions, which he attributes to a previous dog bite that required stitches on his finger.     Past Medical History:  Diagnosis Date   Diabetes mellitus without complication (HCC)    Hypertension     No past surgical history on file.  Family History  Problem Relation Age of Onset   Diabetes Paternal Aunt     Social History   Socioeconomic History   Marital status: Single    Spouse name: Not on file   Number of children: Not on file   Years of education: Not on file   Highest education level: Not on file  Occupational History    Employer: [employer]    Comment: Housekeeping  Tobacco Use   Smoking status: Never   Smokeless tobacco: Never  Substance and Sexual Activity   Alcohol use: No    Alcohol/week: 0.0 standard drinks of alcohol   Drug use: No   Sexual activity: Not on file  Other Topics Concern   Not on  file  Social History Narrative   ** Merged History Encounter **       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   Social Drivers of Health   Financial Resource Strain: Not on file  Food Insecurity: Food Insecurity Present (04/03/2023)   Hunger Vital Sign    Worried About Running Out of Food in the Last Year: Sometimes true    Ran Out of Food in the Last Year: Sometimes true  Transportation Needs: No Transportation Needs (04/03/2023)   PRAPARE - Administrator, Civil Service (Medical): No    Lack of Transportation (Non-Medical): No  Physical Activity: Patient Declined (04/03/2023)   Exercise Vital Sign    Days of Exercise per Week: Patient declined    Minutes of Exercise per Session: Patient declined  Stress: Not on file  Social Connections: Socially Isolated (04/03/2023)   Social Connection and Isolation Panel    Frequency of Communication with Friends and Family: Once a week    Frequency of Social Gatherings with Friends and Family: Once a week    Attends Religious Services: Never    Database administrator or Organizations: No    Attends Banker Meetings: Never    Marital Status: Married    No Known Allergies  Outpatient Medications Prior to Visit  Medication Sig Dispense Refill   acetaminophen  (TYLENOL ) 325  MG tablet Take 650 mg by mouth every 6 (six) hours as needed.     Blood Glucose Monitoring Suppl (TRUE METRIX METER) w/Device KIT USE AS INSTRUCTED 1 kit 0   cholecalciferol (VITAMIN D ) 1000 units tablet Take 5,000 Units by mouth daily.     dapagliflozin  propanediol (FARXIGA ) 10 MG TABS tablet Take 1 tablet (10 mg total) by mouth daily before breakfast. 90 tablet 1   glipiZIDE  (GLUCOTROL ) 10 MG tablet Take 2 tablets (20 mg total) by mouth 2 (two) times daily before a meal. 360 tablet 1   glucose blood (TRUE METRIX BLOOD GLUCOSE TEST) test strip TEST YOUR BLOOD SUGAR ONCE DAILY IN THE MORNING 100 each 2   lisinopril -hydrochlorothiazide  (ZESTORETIC ) 20-25  MG tablet Take 1 tablet by mouth daily. 90 tablet 1   metFORMIN  (GLUCOPHAGE ) 500 MG tablet Take 2 tablets (1,000 mg total) by mouth 2 (two) times daily with a meal. 360 tablet 1   miconazole  (MICOTIN) 2 % cream Apply 1 application topically 2 (two) times daily. 28.35 g 0   Semaglutide ,0.25 or 0.5MG /DOS, (OZEMPIC , 0.25 OR 0.5 MG/DOSE,) 2 MG/3ML SOPN Inject 0.25 mg into the skin once a week. 3 mL 1   TRUEplus Lancets 28G MISC USE TO CHECK BLOOD SUGARS DAILY. 100 each 6   amLODipine  (NORVASC ) 5 MG tablet Take 1 tablet (5 mg total) by mouth daily. 90 tablet 1   atorvastatin  (LIPITOR) 20 MG tablet Take 1 tablet (20 mg total) by mouth daily. 90 tablet 1   No facility-administered medications prior to visit.     ROS Review of Systems  Constitutional:  Negative for activity change and appetite change.  HENT:  Negative for sinus pressure and sore throat.   Respiratory:  Negative for chest tightness, shortness of breath and wheezing.   Cardiovascular:  Negative for chest pain and palpitations.  Gastrointestinal:  Negative for abdominal distention, abdominal pain and constipation.  Genitourinary: Negative.   Musculoskeletal:        See HPI  Psychiatric/Behavioral:  Negative for behavioral problems and dysphoric mood.     Objective:  BP 127/77   Pulse 80   Temp 98.5 F (36.9 C) (Oral)   Ht 5' 4 (1.626 m)   Wt 163 lb 12.8 oz (74.3 kg)   SpO2 100%   BMI 28.12 kg/m      01/14/2024   10:49 AM 10/14/2023    9:33 AM 10/14/2023    9:03 AM  BP/Weight  Systolic BP 127 148 149  Diastolic BP 77 78 65  Wt. (Lbs) 163.8  169.2  BMI 28.12 kg/m2  29.04 kg/m2      Physical Exam Constitutional:      Appearance: He is well-developed.  Cardiovascular:     Rate and Rhythm: Normal rate.     Heart sounds: Normal heart sounds. No murmur heard. Pulmonary:     Effort: Pulmonary effort is normal.     Breath sounds: Normal breath sounds. No wheezing or rales.  Chest:     Chest wall: No tenderness.   Abdominal:     General: Bowel sounds are normal. There is no distension.     Palpations: Abdomen is soft. There is no mass.     Tenderness: There is no abdominal tenderness.  Musculoskeletal:        General: Normal range of motion.     Right lower leg: No edema.     Left lower leg: No edema.     Comments: Normal handgrip bilaterally Negative Tinel and  Phalen signs  Neurological:     Mental Status: He is alert and oriented to person, place, and time.  Psychiatric:        Mood and Affect: Mood normal.        Latest Ref Rng & Units 10/14/2023    9:49 AM 01/01/2023    9:26 AM 04/30/2022    4:29 PM  CMP  Glucose 70 - 99 mg/dL 767  856  800   BUN 6 - 24 mg/dL 14  13  19    Creatinine 0.76 - 1.27 mg/dL 9.21  9.27  9.07   Sodium 134 - 144 mmol/L 138  138  138   Potassium 3.5 - 5.2 mmol/L 4.6  4.3  3.5   Chloride 96 - 106 mmol/L 101  103  97   CO2 20 - 29 mmol/L 21  22  24    Calcium  8.7 - 10.2 mg/dL 9.5  9.2  9.8   Total Protein 6.0 - 8.5 g/dL 6.9   7.7   Total Bilirubin 0.0 - 1.2 mg/dL 0.9   0.7   Alkaline Phos 44 - 121 IU/L 57   53   AST 0 - 40 IU/L 19   17   ALT 0 - 44 IU/L 25   22     Lipid Panel     Component Value Date/Time   CHOL 131 10/14/2023 0949   TRIG 98 10/14/2023 0949   HDL 36 (L) 10/14/2023 0949   CHOLHDL 3.1 08/02/2021 1035   CHOLHDL 4.1 06/11/2016 1441   VLDL 26 06/11/2016 1441   LDLCALC 76 10/14/2023 0949    CBC    Component Value Date/Time   WBC 8.4 08/02/2021 1035   WBC 9.2 09/19/2015 1719   RBC 5.01 08/02/2021 1035   RBC 5.08 09/19/2015 1719   HGB 14.9 08/02/2021 1035   HCT 42.7 08/02/2021 1035   PLT 213 08/02/2021 1035   MCV 85 08/02/2021 1035   MCH 29.7 08/02/2021 1035   MCH 29.3 09/19/2015 1719   MCHC 34.9 08/02/2021 1035   MCHC 35.6 09/19/2015 1719   RDW 12.5 08/02/2021 1035   LYMPHSABS 2.3 08/02/2021 1035   MONOABS 552 09/19/2015 1719   EOSABS 0.3 08/02/2021 1035   BASOSABS 0.1 08/02/2021 1035    Lab Results  Component Value Date    HGBA1C 7.1 (A) 01/14/2024    Lab Results  Component Value Date   HGBA1C 7.1 (A) 01/14/2024   HGBA1C 10.3 (A) 10/14/2023   HGBA1C 9.8 (A) 07/15/2023       Assessment & Plan Right arm pain  Hand numbness and fatigue, possible nerve impingement Possible pinched nerve causing symptoms.  - Offer physical therapy for arm strength but he declines due to his work schedule - Provide home exercises  Type 2 diabetes mellitus with microalbuminuria Diabetes well controlled with A1c of 7.1. Previous proteinuria due to poor diabetes control. -Continue glipizide , Farxiga , metformin , Ozempic  - Reassess urine protein at next visit. - Continue SGLT2i for renoprotection  Hypertension Blood pressure better controlled after increasing amlodipine  dose. -Counseled on blood pressure goal of less than 130/80, low-sodium, DASH diet, medication compliance, 150 minutes of moderate intensity exercise per week. Discussed medication compliance, adverse effects.   Hyperlipidemia Controlled - Continue statin - Low-cholesterol diet   General Health Maintenance Due for flu vaccination. - Administer flu shot.      Meds ordered this encounter  Medications   amLODipine  (NORVASC ) 5 MG tablet    Sig: Take 1 tablet (5 mg  total) by mouth daily.    Dispense:  90 tablet    Refill:  1   atorvastatin  (LIPITOR) 20 MG tablet    Sig: Take 1 tablet (20 mg total) by mouth daily.    Dispense:  90 tablet    Refill:  1    Follow-up: Return in about 3 months (around 04/15/2024) for Chronic medical conditions.       Corrina Sabin, MD, FAAFP. [facility] [place], [place] [phone]   01/14/2024, 12:48 PM

## 2024-01-23 ENCOUNTER — Other Ambulatory Visit: Payer: Self-pay

## 2024-01-25 ENCOUNTER — Other Ambulatory Visit (HOSPITAL_COMMUNITY): Payer: Self-pay

## 2024-01-25 ENCOUNTER — Other Ambulatory Visit: Payer: Self-pay

## 2024-01-26 ENCOUNTER — Other Ambulatory Visit (HOSPITAL_BASED_OUTPATIENT_CLINIC_OR_DEPARTMENT_OTHER): Payer: Self-pay

## 2024-01-26 ENCOUNTER — Other Ambulatory Visit: Payer: Self-pay

## 2024-02-09 ENCOUNTER — Other Ambulatory Visit: Payer: Self-pay

## 2024-02-12 ENCOUNTER — Ambulatory Visit: Payer: Self-pay | Attending: Family Medicine | Admitting: Pharmacist

## 2024-02-12 ENCOUNTER — Encounter: Payer: Self-pay | Admitting: Pharmacist

## 2024-02-12 DIAGNOSIS — Z7984 Long term (current) use of oral hypoglycemic drugs: Secondary | ICD-10-CM

## 2024-02-12 DIAGNOSIS — E1169 Type 2 diabetes mellitus with other specified complication: Secondary | ICD-10-CM

## 2024-02-12 DIAGNOSIS — Z7985 Long-term (current) use of injectable non-insulin antidiabetic drugs: Secondary | ICD-10-CM

## 2024-02-12 NOTE — Progress Notes (Signed)
 Interpretor ID # [id number], Name: [name]  S:    Billy Gregory is a 43 y.o. male who presents for diabetes evaluation, education, and management. PMH is significant for HTN, T2DM (since 2016), and DLD.    Primary Care Provider is Dr. Newlin. Patient was referred and last seen by Dr. Newlin on 01/14/24. At that visit, A1c was 7.1, down from 10.3% prior.  I last saw him on 12/25/2023. He endorsed medication adherence. Had some issues with initial injections but was taking Ozempic  without problems by the time I saw him last. Endorsed adherence to his metformin , glipizide , and Farxiga .  Today, patient arrives in good spirits. Brings in his medications for review. Patient reports taking all medications as prescribed. He was approved for Farxiga  PASS in August of this year. He is taking this, glipizide , metformin , and Ozempic . He is taking Ozempic  0.25 mg weekly and endorses nausea that happens several times weekly. He brings his meter in for review. Patient denies hypoglycemic events or any symptoms of hyperglycemia.  Current diabetes medications include: glipizide  10mg  BID, Farxiga  10 mg daily, metformin  1000mg  BID, Ozempic  0.25 mg weekly (Saturday).  Current hypertension medications include: amlodipine  5 mg daily, lisinopril  20mg /HCTZ 25mg  daily  Brought blood glucose log from home.  Reported home fasting blood sugars:  -7 day avg: 129mg /dL -30 day avg: 865 mg/dL  Patient reported dietary habits: Eats 3 meals/day Patient-reported exercise habits: works outside  O:  [place] Value Date   HGBA1C 7.1 (A) 01/14/2024   There were no vitals filed for this visit.  Lipid Panel     Component Value Date/Time   CHOL 131 10/14/2023 0949   TRIG 98 10/14/2023 0949   HDL 36 (L) 10/14/2023 0949   CHOLHDL 3.1 08/02/2021 1035   CHOLHDL 4.1 06/11/2016 1441   VLDL 26 06/11/2016 1441   LDLCALC 76 10/14/2023 0949   Clinical Atherosclerotic Cardiovascular Disease (ASCVD):  The  10-year ASCVD risk score (Arnett DK, et al., 2019) is: 2.9%   Values used to calculate the score:     Age: 51 years     Clincally relevant sex: Male     Is Non-Hispanic African American: No     Diabetic: Yes     Tobacco smoker: No     Systolic Blood Pressure: 127 mmHg     Is BP treated: Yes     HDL Cholesterol: 36 mg/dL     Total Cholesterol: 131 mg/dL   A/P: Diabetes longstanding currently at goal with last A1c 7.1%. His glucometer shows goal glycemic control at home. He is still taking Ozempic  at the 0.25 mg weekly dose and endorses occasional nausea. He denies any vomiting or abdominal pain. He wishes to stay on Ozempic  given the improvement in his home blood sugar control.  -Continue Farxiga  10mg  daily.  -Continue Ozempic  0.25 mg weekly.  -Continue metformin  and glipizide  at current doses.  -Extensively discussed pathophysiology of diabetes, recommended lifestyle interventions, dietary effects on blood sugar control.  -Continue to check blood glucose at home. Reviewed technique including wiping away first drop of blood after using alcohol swab. Encouraged to bring log to next visit. -Next A1c anticipated 03/2024.   Written patient instructions provided. Patient verbalized understanding of treatment plan. Total time in face to face counseling 30 minutes.    Follow up with PCP: 04/15/24  Thank you for allowing pharmacy to participate in this patient's care.  Herlene Fleeta Morris, PharmD, JAQUELINE, CPP Clinical Pharmacist [facility] [phone]

## 2024-03-31 ENCOUNTER — Other Ambulatory Visit: Payer: Self-pay

## 2024-03-31 ENCOUNTER — Other Ambulatory Visit: Payer: Self-pay | Admitting: Pharmacist

## 2024-03-31 ENCOUNTER — Other Ambulatory Visit: Payer: Self-pay | Admitting: Family Medicine

## 2024-03-31 DIAGNOSIS — E1169 Type 2 diabetes mellitus with other specified complication: Secondary | ICD-10-CM

## 2024-03-31 MED ORDER — OZEMPIC (0.25 OR 0.5 MG/DOSE) 2 MG/3ML ~~LOC~~ SOPN
0.2500 mg | PEN_INJECTOR | SUBCUTANEOUS | 1 refills | Status: DC
  Filled 2024-03-31: qty 3, 56d supply, fill #0

## 2024-03-31 MED ORDER — TRULICITY 0.75 MG/0.5ML ~~LOC~~ SOAJ
0.7500 mg | SUBCUTANEOUS | 1 refills | Status: DC
  Filled 2024-03-31: qty 2, 28d supply, fill #0

## 2024-03-31 NOTE — Progress Notes (Signed)
 Changing Ozempic  to Trulicity  due to changes in patient assistance and medication cost.   Herlene Fleeta Morris, PharmD, Protection, CPP Clinical Pharmacist [facility] [phone]

## 2024-04-15 ENCOUNTER — Encounter: Payer: Self-pay | Admitting: Family Medicine

## 2024-04-15 ENCOUNTER — Other Ambulatory Visit: Payer: Self-pay

## 2024-04-15 ENCOUNTER — Ambulatory Visit: Payer: Self-pay | Attending: Family Medicine | Admitting: Family Medicine

## 2024-04-15 VITALS — BP 129/75 | HR 89 | Temp 98.6°F | Ht 64.0 in | Wt 166.2 lb

## 2024-04-15 DIAGNOSIS — E119 Type 2 diabetes mellitus without complications: Secondary | ICD-10-CM

## 2024-04-15 DIAGNOSIS — Z7985 Long-term (current) use of injectable non-insulin antidiabetic drugs: Secondary | ICD-10-CM

## 2024-04-15 DIAGNOSIS — Z7984 Long term (current) use of oral hypoglycemic drugs: Secondary | ICD-10-CM

## 2024-04-15 DIAGNOSIS — Z23 Encounter for immunization: Secondary | ICD-10-CM

## 2024-04-15 DIAGNOSIS — E781 Pure hyperglyceridemia: Secondary | ICD-10-CM

## 2024-04-15 DIAGNOSIS — E785 Hyperlipidemia, unspecified: Secondary | ICD-10-CM

## 2024-04-15 DIAGNOSIS — E1169 Type 2 diabetes mellitus with other specified complication: Secondary | ICD-10-CM

## 2024-04-15 DIAGNOSIS — I10 Essential (primary) hypertension: Secondary | ICD-10-CM

## 2024-04-15 LAB — POCT GLYCOSYLATED HEMOGLOBIN (HGB A1C): HbA1c, POC (controlled diabetic range): 7.1 % — AB (ref 0.0–7.0)

## 2024-04-15 MED ORDER — TRULICITY 0.75 MG/0.5ML ~~LOC~~ SOAJ
0.7500 mg | SUBCUTANEOUS | 1 refills | Status: AC
  Filled 2024-04-15 – 2024-04-29 (×2): qty 2, 28d supply, fill #0

## 2024-04-15 MED ORDER — AMLODIPINE BESYLATE 5 MG PO TABS
5.0000 mg | ORAL_TABLET | Freq: Every day | ORAL | 1 refills | Status: AC
  Filled 2024-04-15: qty 90, 90d supply, fill #0

## 2024-04-15 MED ORDER — LISINOPRIL-HYDROCHLOROTHIAZIDE 20-25 MG PO TABS
1.0000 | ORAL_TABLET | Freq: Every day | ORAL | 1 refills | Status: AC
  Filled 2024-04-15: qty 90, 90d supply, fill #0

## 2024-04-15 MED ORDER — ATORVASTATIN CALCIUM 20 MG PO TABS
20.0000 mg | ORAL_TABLET | Freq: Every day | ORAL | 1 refills | Status: AC
  Filled 2024-04-15 – 2024-04-29 (×2): qty 90, 90d supply, fill #0

## 2024-04-15 MED ORDER — METFORMIN HCL 500 MG PO TABS
1000.0000 mg | ORAL_TABLET | Freq: Two times a day (BID) | ORAL | 1 refills | Status: AC
  Filled 2024-04-15 – 2024-04-29 (×2): qty 360, 90d supply, fill #0

## 2024-04-15 MED ORDER — GLIPIZIDE 10 MG PO TABS
20.0000 mg | ORAL_TABLET | Freq: Two times a day (BID) | ORAL | 1 refills | Status: AC
  Filled 2024-04-15 – 2024-04-29 (×2): qty 360, 90d supply, fill #0

## 2024-04-15 MED ORDER — DAPAGLIFLOZIN PROPANEDIOL 10 MG PO TABS
10.0000 mg | ORAL_TABLET | Freq: Every day | ORAL | 1 refills | Status: AC
  Filled 2024-04-15: qty 90, 90d supply, fill #0

## 2024-04-15 NOTE — Progress Notes (Signed)
 "  Subjective:  Patient ID: Billy Gregory, male    DOB: [birth year]  Age: 44 y.o. MRN: [id number]  CC: Medical Management of Chronic Issues     Discussed the use of AI scribe software for clinical note transcription with the patient, who gave verbal consent to proceed.  History of Present Illness Billy Gregory is a 44 year old male with a history of hyperlipidemia, hypertriglyceridemia, hypertension, type II diabetes  who presents for a follow-up visit.  His hemoglobin A1c is stable at 7.1, consistent with prior values. He has not had neuropathy symptoms and denies visual concerns but he sometimes feels shaky or nervous when hungry, which resolves after eating. He uses Trulicity  once weekly for diabetes and has an adequate supply.  He endorses adherence with his antihypertensive and his statin.  Past Medical History:  Diagnosis Date   Diabetes mellitus without complication (HCC)    Hypertension     No past surgical history on file.  Family History  Problem Relation Age of Onset   Diabetes Paternal Aunt     Social History   Socioeconomic History   Marital status: Single    Spouse name: Not on file   Number of children: Not on file   Years of education: Not on file   Highest education level: Not on file  Occupational History    Employer: [employer]    Comment: Housekeeping  Tobacco Use   Smoking status: Never   Smokeless tobacco: Never  Substance and Sexual Activity   Alcohol use: No    Alcohol/week: 0.0 standard drinks of alcohol   Drug use: No   Sexual activity: Not on file  Other Topics Concern   Not on file  Social History Narrative   ** Merged History Encounter **       Employment: housekeeping     From [place].   Social Drivers of Health   Tobacco Use: Low Risk (04/15/2024)   Patient History    Smoking Tobacco Use: Never    Smokeless Tobacco Use: Never    Passive Exposure: Not on file  Financial Resource Strain: Not on file  Food  Insecurity: Food Insecurity Present (04/03/2023)   Hunger Vital Sign    Worried About Radiation Protection Practitioner of Food in the Last Year: Sometimes true    Ran Out of Food in the Last Year: Sometimes true  Transportation Needs: No Transportation Needs (04/03/2023)   PRAPARE - Administrator, Civil Service (Medical): No    Lack of Transportation (Non-Medical): No  Physical Activity: Patient Declined (04/03/2023)   Exercise Vital Sign    Days of Exercise per Week: Patient declined    Minutes of Exercise per Session: Patient declined  Stress: Not on file  Social Connections: Socially Isolated (04/03/2023)   Social Connection and Isolation Panel    Frequency of Communication with Friends and Family: Once a week    Frequency of Social Gatherings with Friends and Family: Once a week    Attends Religious Services: Never    Database Administrator or Organizations: No    Attends Banker Meetings: Never    Marital Status: Married  Depression (PHQ2-9): Low Risk (01/14/2024)   Depression (PHQ2-9)    PHQ-2 Score: 0  Alcohol Screen: Low Risk (04/03/2023)   Alcohol Screen    Last Alcohol Screening Score (AUDIT): 6  Housing: Unknown (04/03/2023)   Housing Stability Vital Sign    Unable to Pay for Housing in the Last Year: No  Number of Times Moved in the Last Year: Not on file    Homeless in the Last Year: No  Utilities: Not At Risk (04/03/2023)   AHC Utilities    Threatened with loss of utilities: No  Health Literacy: Adequate Health Literacy (04/03/2023)   B1300 Health Literacy    Frequency of need for help with medical instructions: Never    Allergies[1]  Outpatient Medications Prior to Visit  Medication Sig Dispense Refill   acetaminophen  (TYLENOL ) 325 MG tablet Take 650 mg by mouth every 6 (six) hours as needed.     Blood Glucose Monitoring Suppl (TRUE METRIX METER) w/Device KIT USE AS INSTRUCTED 1 kit 0   cholecalciferol (VITAMIN D ) 1000 units tablet Take 5,000 Units by mouth daily.      glucose blood (TRUE METRIX BLOOD GLUCOSE TEST) test strip TEST YOUR BLOOD SUGAR ONCE DAILY IN THE MORNING 100 each 2   TRUEplus Lancets 28G MISC USE TO CHECK BLOOD SUGARS DAILY. 100 each 6   amLODipine  (NORVASC ) 5 MG tablet Take 1 tablet (5 mg total) by mouth daily. 90 tablet 1   atorvastatin  (LIPITOR) 20 MG tablet Take 1 tablet (20 mg total) by mouth daily. 90 tablet 1   dapagliflozin  propanediol (FARXIGA ) 10 MG TABS tablet Take 1 tablet (10 mg total) by mouth daily before breakfast. 90 tablet 1   Dulaglutide  (TRULICITY ) 0.75 MG/0.5ML SOAJ Inject 0.75 mg into the skin once a week. 2 mL 1   glipiZIDE  (GLUCOTROL ) 10 MG tablet Take 2 tablets (20 mg total) by mouth 2 (two) times daily before a meal. 360 tablet 1   lisinopril -hydrochlorothiazide  (ZESTORETIC ) 20-25 MG tablet Take 1 tablet by mouth daily. 90 tablet 1   metFORMIN  (GLUCOPHAGE ) 500 MG tablet Take 2 tablets (1,000 mg total) by mouth 2 (two) times daily with a meal. 360 tablet 1   miconazole  (MICOTIN) 2 % cream Apply 1 application topically 2 (two) times daily. (Patient not taking: Reported on 04/15/2024) 28.35 g 0   No facility-administered medications prior to visit.     ROS Review of Systems  Constitutional:  Negative for activity change and appetite change.  HENT:  Negative for sinus pressure and sore throat.   Respiratory:  Negative for chest tightness, shortness of breath and wheezing.   Cardiovascular:  Negative for chest pain and palpitations.  Gastrointestinal:  Negative for abdominal distention, abdominal pain and constipation.  Genitourinary: Negative.   Musculoskeletal: Negative.   Psychiatric/Behavioral:  Negative for behavioral problems and dysphoric mood.     Objective:  BP 129/75   Pulse 89   Temp 98.6 F (37 C) (Oral)   Ht 5' 4 (1.626 m)   Wt 166 lb 3.2 oz (75.4 kg)   SpO2 99%   BMI 28.53 kg/m      04/15/2024    8:46 AM 01/14/2024   10:49 AM 10/14/2023    9:33 AM  BP/Weight  Systolic BP 129 127 148   Diastolic BP 75 77 78  Wt. (Lbs) 166.2 163.8   BMI 28.53 kg/m2 28.12 kg/m2       Physical Exam Constitutional:      Appearance: He is well-developed.  Cardiovascular:     Rate and Rhythm: Normal rate.     Heart sounds: Normal heart sounds. No murmur heard. Pulmonary:     Effort: Pulmonary effort is normal.     Breath sounds: Normal breath sounds. No wheezing or rales.  Chest:     Chest wall: No tenderness.  Abdominal:  General: Bowel sounds are normal. There is no distension.     Palpations: Abdomen is soft. There is no mass.     Tenderness: There is no abdominal tenderness.  Musculoskeletal:        General: Normal range of motion.     Right lower leg: No edema.     Left lower leg: No edema.  Neurological:     Mental Status: He is alert and oriented to person, place, and time.  Psychiatric:        Mood and Affect: Mood normal.        Latest Ref Rng & Units 10/14/2023    9:49 AM 01/01/2023    9:26 AM 04/30/2022    4:29 PM  CMP  Glucose 70 - 99 mg/dL 767  856  800   BUN 6 - 24 mg/dL 14  13  19    Creatinine 0.76 - 1.27 mg/dL 9.21  9.27  9.07   Sodium 134 - 144 mmol/L 138  138  138   Potassium 3.5 - 5.2 mmol/L 4.6  4.3  3.5   Chloride 96 - 106 mmol/L 101  103  97   CO2 20 - 29 mmol/L 21  22  24    Calcium  8.7 - 10.2 mg/dL 9.5  9.2  9.8   Total Protein 6.0 - 8.5 g/dL 6.9   7.7   Total Bilirubin 0.0 - 1.2 mg/dL 0.9   0.7   Alkaline Phos 44 - 121 IU/L 57   53   AST 0 - 40 IU/L 19   17   ALT 0 - 44 IU/L 25   22     Lipid Panel     Component Value Date/Time   CHOL 131 10/14/2023 0949   TRIG 98 10/14/2023 0949   HDL 36 (L) 10/14/2023 0949   CHOLHDL 3.1 08/02/2021 1035   CHOLHDL 4.1 06/11/2016 1441   VLDL 26 06/11/2016 1441   LDLCALC 76 10/14/2023 0949    CBC    Component Value Date/Time   WBC 8.4 08/02/2021 1035   WBC 9.2 09/19/2015 1719   RBC 5.01 08/02/2021 1035   RBC 5.08 09/19/2015 1719   HGB 14.9 08/02/2021 1035   HCT 42.7 08/02/2021 1035   PLT  213 08/02/2021 1035   MCV 85 08/02/2021 1035   MCH 29.7 08/02/2021 1035   MCH 29.3 09/19/2015 1719   MCHC 34.9 08/02/2021 1035   MCHC 35.6 09/19/2015 1719   RDW 12.5 08/02/2021 1035   LYMPHSABS 2.3 08/02/2021 1035   MONOABS 552 09/19/2015 1719   EOSABS 0.3 08/02/2021 1035   BASOSABS 0.1 08/02/2021 1035    Lab Results  Component Value Date   HGBA1C 7.1 (A) 04/15/2024    Lab Results  Component Value Date   HGBA1C 7.1 (A) 04/15/2024   HGBA1C 7.1 (A) 01/14/2024   HGBA1C 10.3 (A) 10/14/2023       Assessment & Plan Type 2 diabetes mellitus Well-controlled with A1c of 7.1. No hypoglycemia, mild shakiness when hungry. - Continue current diabetes management regimen including Trulicity  once a week. - Ensure regular meals to prevent hypoglycemia. - Refilled all diabetes medications. -Counseled on Diabetic diet, the healthy plate, 849 minutes of moderate intensity exercise/week Blood sugar logs with fasting goals of 80-120 mg/dl, random of less than 819 and in the event of sugars less than 60 mg/dl or greater than 599 mg/dl encouraged to notify the clinic. Advised on the need for annual eye exams, annual foot exams, Pneumonia vaccine.  Hypertension Well-controlled with current medication regimen. - Continue current antihypertensive medications. -Counseled on blood pressure goal of less than 130/80, low-sodium, DASH diet, medication compliance, 150 minutes of moderate intensity exercise per week. Discussed medication compliance, adverse effects.   Hyperlipidemia Previously well-controlled. Re-evaluation planned. - Rechecked cholesterol levels today. - Low-cholesterol diet  General Health Maintenance Routine health maintenance addressed. Encounter for vaccine administration-flu shot administered     Meds ordered this encounter  Medications   glipiZIDE  (GLUCOTROL ) 10 MG tablet    Sig: Take 2 tablets (20 mg total) by mouth 2 (two) times daily before a meal.    Dispense:   360 tablet    Refill:  1   lisinopril -hydrochlorothiazide  (ZESTORETIC ) 20-25 MG tablet    Sig: Take 1 tablet by mouth daily.    Dispense:  90 tablet    Refill:  1   amLODipine  (NORVASC ) 5 MG tablet    Sig: Take 1 tablet (5 mg total) by mouth daily.    Dispense:  90 tablet    Refill:  1   atorvastatin  (LIPITOR) 20 MG tablet    Sig: Take 1 tablet (20 mg total) by mouth daily.    Dispense:  90 tablet    Refill:  1   dapagliflozin  propanediol (FARXIGA ) 10 MG TABS tablet    Sig: Take 1 tablet (10 mg total) by mouth daily before breakfast.    Dispense:  90 tablet    Refill:  1   Dulaglutide  (TRULICITY ) 0.75 MG/0.5ML SOAJ    Sig: Inject 0.75 mg into the skin once a week.    Dispense:  2 mL    Refill:  1   metFORMIN  (GLUCOPHAGE ) 500 MG tablet    Sig: Take 2 tablets (1,000 mg total) by mouth 2 (two) times daily with a meal.    Dispense:  360 tablet    Refill:  1    Follow-up: Return in about 6 months (around 10/13/2024) for Chronic medical conditions.       Corrina Sabin, MD, FAAFP. [facility] [place], [place] [phone]   04/15/2024, 9:27 AM     [1] No Known Allergies  "

## 2024-04-15 NOTE — Patient Instructions (Signed)
 La diabetes mellitus y el cuidado de los pies Diabetes Mellitus and Foot Care La diabetes, tambin llamada diabetes mellitus, puede causar problemas en los pies y las piernas debido a un flujo sanguneo (circulacin) deficiente. La mala circulacin puede hacer que la piel: Se torne ms fina y seca. Se resquebraje ms fcilmente. Cicatrice ms lentamente. Se descame y agriete. Tambin puede presentar tener dao nervioso (neuropata). Esto puede causar una disminucin de la sensibilidad en las piernas y los pies. En consecuencia, es posible que no advierta heridas pequeas en los pies que pueden causar problemas ms graves. Identificar problemas y tratarlos lo antes posible es la mejor manera de evitar futuros problemas en los pies. Cmo cuidar los pies Higiene de los pies  [employer] pies todos los [address]  [address] con agua tibia y un [place]. No use agua caliente. Luego squese los pies y [employer] dedos dando palmaditas hasta que estn completamente secos. No ponga los pies en remojo. Esto puede secarle la piel. Crtese las uas de los pies en lnea recta. No escarbe debajo de las uas o alrededor [employer]. Lime los bordes de las uas con una lima o esmeril. Aplique una locin hidratante o vaselina en la piel de los pies y en las uas secas y [place]. Use una locin que no contenga alcohol ni fragancias. No aplique locin entre los dedos. Zapatos y calcetines Use calcetines de algodn o medias limpias todos los [place]. Asegrese de que no le [employer] No use medias hasta la rodilla. Estas pueden reducir el flujo de sangre a las piernas. Use zapatos que le queden bien y que sean acolchados. Revise siempre los zapatos antes de ponrselos para asegurarse de que no haya objetos en su interior. Para amoldar los zapatos, clcelos solo algunas horas por da. Esto evitar lesiones en los pies. Heridas, rasguos, durezas y callosidades  Controle sus pies diariamente para observar si hay  ampollas, cortes, moretones, llagas o enrojecimiento. Si no puede ver la planta del pie, use un espejo o pdale ayuda a Engineer, maintenance (IT). No corte las durezas o callosidades, ni trate de quitarlas con medicamentos. Si algo le ha raspado, cortado o lastimado la piel de los pies, mantenga la piel de esa zona limpia y [place]. Puede higienizar estas zonas con agua y un jabn suave. No limpie la zona con agua oxigenada, alcohol ni yodo. Si tiene una herida, un rasguo, una dureza o una callosidad en el pie, revsela varias veces al da para asegurarse de que se est curando y no se infecte. Est atento a los siguientes signos: Enrojecimiento, Optician, dispensing. Lquido o sangre. Calor. Pus o mal olor. Consejos generales No se cruce de piernas. Esto puede disminuir el flujo de sangre a los pies. No use bolsas de agua caliente ni almohadillas trmicas en los pies. Podran causar quemaduras. Si ha perdido la sensibilidad en los pies o las piernas, no sabr lo que le est sucediendo hasta que sea demasiado tarde. Proteja sus pies del calor y del fro con calzado, en la playa o sobre el pavimento caliente. Programe una cita para un examen completo de los pies por lo menos una vez al ao o con ms frecuencia si tiene [employer]. Informe todos los cortes, llagas o moretones a su mdico de inmediato. Dnde obtener ms informacin American Diabetes Association (Asociacin Estadounidense de la Diabetes): diabetes.org Association of Diabetes Care & Education Specialists (Asociacin de Especialistas en Atencin y Educacin sobre la Diabetes): diabeteseducator.org Comunquese con un  mdico si: Tiene una afeccin que aumenta su riesgo de tener infecciones y tiene cortes, llagas o moretones en los pies. Tiene una lesin que no se [place]. Tiene una zona irritada en las piernas o los pies. Siente una sensacin de ardor u hormigueo en las piernas o los pies. Siente dolor o calambres en las piernas o los pies. Las  piernas o los pies estn adormecidos. Siente los pies siempre fros. Siente dolor alrededor de [place] ua del pie. Solicite ayuda de inmediato si: Tiene una herida, un rasguo, una dureza o una callosidad en el pie y: [place] signos de infeccin. Tiene fiebre. Tiene una lnea roja que sube por la pierna. Esta informacin no tiene Theme park manager el consejo del mdico. Asegrese de hacerle al mdico cualquier pregunta que tenga. Document Revised: 10/02/2021 Document Reviewed: 10/02/2021 Elsevier Patient Education  2024 [employer].

## 2024-04-17 ENCOUNTER — Ambulatory Visit: Payer: Self-pay | Admitting: Family Medicine

## 2024-04-17 LAB — CMP14+EGFR
ALT: 24 [IU]/L (ref 0–44)
AST: 18 [IU]/L (ref 0–40)
Albumin: 4.6 g/dL (ref 4.1–5.1)
Alkaline Phosphatase: 50 [IU]/L (ref 47–123)
BUN/Creatinine Ratio: 16 (ref 9–20)
BUN: 13 mg/dL (ref 6–24)
Bilirubin Total: 0.9 mg/dL (ref 0.0–1.2)
CO2: 24 mmol/L (ref 20–29)
Calcium: 9.6 mg/dL (ref 8.7–10.2)
Chloride: 102 mmol/L (ref 96–106)
Creatinine, Ser: 0.79 mg/dL (ref 0.76–1.27)
Globulin, Total: 2.5 g/dL (ref 1.5–4.5)
Glucose: 126 mg/dL — ABNORMAL HIGH (ref 70–99)
Potassium: 4.3 mmol/L (ref 3.5–5.2)
Sodium: 140 mmol/L (ref 134–144)
Total Protein: 7.1 g/dL (ref 6.0–8.5)
eGFR: 112 mL/min/{1.73_m2}

## 2024-04-17 LAB — LP+NON-HDL CHOLESTEROL
Cholesterol, Total: 115 mg/dL (ref 100–199)
HDL: 31 mg/dL — ABNORMAL LOW
LDL Chol Calc (NIH): 62 mg/dL (ref 0–99)
Total Non-HDL-Chol (LDL+VLDL): 84 mg/dL (ref 0–129)
Triglycerides: 124 mg/dL (ref 0–149)
VLDL Cholesterol Cal: 22 mg/dL (ref 5–40)

## 2024-04-17 LAB — MICROALBUMIN / CREATININE URINE RATIO
Creatinine, Urine: 59.5 mg/dL
Microalb/Creat Ratio: 20 mg/g{creat} (ref 0–29)
Microalbumin, Urine: 12.1 ug/mL

## 2024-04-29 ENCOUNTER — Other Ambulatory Visit: Payer: Self-pay

## 2024-04-29 ENCOUNTER — Other Ambulatory Visit (HOSPITAL_COMMUNITY): Payer: Self-pay

## 2024-10-13 ENCOUNTER — Ambulatory Visit: Payer: Self-pay | Admitting: Family Medicine
